# Patient Record
Sex: Female | Born: 1980 | Race: Black or African American | Hispanic: No | Smoking: Former smoker
Health system: Southern US, Community
[De-identification: ages and names within clinical notes are randomized; demographics above are authoritative.]

## PROBLEM LIST (undated history)

## (undated) DIAGNOSIS — G4700 Insomnia, unspecified: Secondary | ICD-10-CM

## (undated) DIAGNOSIS — N809 Endometriosis, unspecified: Secondary | ICD-10-CM

## (undated) DIAGNOSIS — Z6841 Body Mass Index (BMI) 40.0 and over, adult: Secondary | ICD-10-CM

## (undated) DIAGNOSIS — S62339A Displaced fracture of neck of unspecified metacarpal bone, initial encounter for closed fracture: Secondary | ICD-10-CM

## (undated) DIAGNOSIS — I10 Essential (primary) hypertension: Secondary | ICD-10-CM

## (undated) DIAGNOSIS — E7800 Pure hypercholesterolemia, unspecified: Secondary | ICD-10-CM

## (undated) DIAGNOSIS — S82899A Other fracture of unspecified lower leg, initial encounter for closed fracture: Secondary | ICD-10-CM

## (undated) DIAGNOSIS — N80129 Deep endometriosis of ovary, unspecified ovary: Secondary | ICD-10-CM

## (undated) DIAGNOSIS — F419 Anxiety disorder, unspecified: Secondary | ICD-10-CM

## (undated) DIAGNOSIS — F909 Attention-deficit hyperactivity disorder, unspecified type: Secondary | ICD-10-CM

## (undated) DIAGNOSIS — Z23 Encounter for immunization: Secondary | ICD-10-CM

## (undated) DIAGNOSIS — R87619 Unspecified abnormal cytological findings in specimens from cervix uteri: Secondary | ICD-10-CM

## (undated) DIAGNOSIS — F40240 Claustrophobia: Secondary | ICD-10-CM

## (undated) DIAGNOSIS — F32A Depression, unspecified: Secondary | ICD-10-CM

## (undated) DIAGNOSIS — Z87828 Personal history of other (healed) physical injury and trauma: Secondary | ICD-10-CM

## (undated) DIAGNOSIS — L309 Dermatitis, unspecified: Secondary | ICD-10-CM

## (undated) DIAGNOSIS — G43909 Migraine, unspecified, not intractable, without status migrainosus: Secondary | ICD-10-CM

## (undated) DIAGNOSIS — G8929 Other chronic pain: Secondary | ICD-10-CM

## (undated) DIAGNOSIS — R519 Headache, unspecified: Secondary | ICD-10-CM

## (undated) DIAGNOSIS — T7840XA Allergy, unspecified, initial encounter: Secondary | ICD-10-CM

## (undated) DIAGNOSIS — E785 Hyperlipidemia, unspecified: Secondary | ICD-10-CM

## (undated) DIAGNOSIS — R51 Headache: Secondary | ICD-10-CM

## (undated) HISTORY — DX: Body Mass Index (BMI) 40.0 and over, adult: Z684

## (undated) HISTORY — PX: LIFT, ARM (COSMETIC): SHX4701

## (undated) HISTORY — DX: Dermatitis, unspecified: L30.9

## (undated) HISTORY — DX: Morbid (severe) obesity due to excess calories: E66.01

## (undated) HISTORY — DX: Encounter for immunization: Z23

## (undated) HISTORY — DX: Endometriosis, unspecified: N80.9

## (undated) HISTORY — DX: Unspecified abnormal cytological findings in specimens from cervix uteri: R87.619

## (undated) HISTORY — DX: Claustrophobia: F40.240

## (undated) HISTORY — DX: Insomnia, unspecified: G47.00

## (undated) HISTORY — DX: Essential (primary) hypertension: I10

## (undated) HISTORY — DX: Pure hypercholesterolemia, unspecified: E78.00

## (undated) HISTORY — DX: Attention-deficit hyperactivity disorder, unspecified type: F90.9

## (undated) HISTORY — PX: OTHER SURGICAL HISTORY: SHX169

## (undated) HISTORY — DX: Headache, unspecified: R51.9

## (undated) HISTORY — DX: Migraine, unspecified, not intractable, without status migrainosus: G43.909

## (undated) HISTORY — DX: Headache: R51

## (undated) HISTORY — DX: Other chronic pain: G89.29

## (undated) HISTORY — PX: CYST EXCISION: SHX5701

## (undated) HISTORY — DX: Hyperlipidemia, unspecified: E78.5

## (undated) HISTORY — DX: Allergy, unspecified, initial encounter: T78.40XA

---
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
## 1999-01-29 ENCOUNTER — Inpatient Hospital Stay (HOSPITAL_COMMUNITY): Admission: AD | Admit: 1999-01-29 | Discharge: 1999-01-29 | Payer: Self-pay | Admitting: Obstetrics

## 1999-03-01 ENCOUNTER — Emergency Department (HOSPITAL_COMMUNITY): Admission: EM | Admit: 1999-03-01 | Discharge: 1999-03-01 | Payer: Self-pay | Admitting: Emergency Medicine

## 1999-03-29 ENCOUNTER — Inpatient Hospital Stay (HOSPITAL_COMMUNITY): Admission: AD | Admit: 1999-03-29 | Discharge: 1999-03-29 | Payer: Self-pay | Admitting: Obstetrics

## 1999-07-27 ENCOUNTER — Other Ambulatory Visit: Admission: RE | Admit: 1999-07-27 | Discharge: 1999-07-27 | Payer: Self-pay | Admitting: Obstetrics

## 1999-10-21 ENCOUNTER — Inpatient Hospital Stay (HOSPITAL_COMMUNITY): Admission: AD | Admit: 1999-10-21 | Discharge: 1999-10-21 | Payer: Self-pay | Admitting: Obstetrics

## 1999-10-25 ENCOUNTER — Inpatient Hospital Stay (HOSPITAL_COMMUNITY): Admission: AD | Admit: 1999-10-25 | Discharge: 1999-10-25 | Payer: Self-pay | Admitting: Obstetrics

## 1999-11-15 ENCOUNTER — Inpatient Hospital Stay (HOSPITAL_COMMUNITY): Admission: AD | Admit: 1999-11-15 | Discharge: 1999-11-15 | Payer: Self-pay | Admitting: Obstetrics

## 1999-12-03 ENCOUNTER — Inpatient Hospital Stay (HOSPITAL_COMMUNITY): Admission: AD | Admit: 1999-12-03 | Discharge: 1999-12-05 | Payer: Self-pay | Admitting: Obstetrics

## 1999-12-10 ENCOUNTER — Encounter: Admission: RE | Admit: 1999-12-10 | Discharge: 2000-03-09 | Payer: Self-pay | Admitting: Obstetrics

## 2000-02-26 ENCOUNTER — Emergency Department (HOSPITAL_COMMUNITY): Admission: EM | Admit: 2000-02-26 | Discharge: 2000-02-26 | Payer: Self-pay | Admitting: Emergency Medicine

## 2000-08-09 ENCOUNTER — Inpatient Hospital Stay (HOSPITAL_COMMUNITY): Admission: AD | Admit: 2000-08-09 | Discharge: 2000-08-09 | Payer: Self-pay | Admitting: Obstetrics

## 2001-01-29 ENCOUNTER — Other Ambulatory Visit: Admission: RE | Admit: 2001-01-29 | Discharge: 2001-01-29 | Payer: Self-pay | Admitting: Obstetrics

## 2001-08-17 ENCOUNTER — Emergency Department (HOSPITAL_COMMUNITY): Admission: EM | Admit: 2001-08-17 | Discharge: 2001-08-17 | Payer: Self-pay

## 2001-11-14 HISTORY — PX: ABDOMINAL HYSTERECTOMY: SHX81

## 2002-02-14 ENCOUNTER — Inpatient Hospital Stay (HOSPITAL_COMMUNITY): Admission: AD | Admit: 2002-02-14 | Discharge: 2002-02-14 | Payer: Self-pay | Admitting: *Deleted

## 2002-02-15 ENCOUNTER — Encounter: Payer: Self-pay | Admitting: Obstetrics

## 2002-02-15 ENCOUNTER — Ambulatory Visit (HOSPITAL_COMMUNITY): Admission: RE | Admit: 2002-02-15 | Discharge: 2002-02-15 | Payer: Self-pay | Admitting: *Deleted

## 2002-02-25 ENCOUNTER — Emergency Department (HOSPITAL_COMMUNITY): Admission: EM | Admit: 2002-02-25 | Discharge: 2002-02-25 | Payer: Self-pay | Admitting: Emergency Medicine

## 2002-03-08 ENCOUNTER — Inpatient Hospital Stay (HOSPITAL_COMMUNITY): Admission: AD | Admit: 2002-03-08 | Discharge: 2002-03-08 | Payer: Self-pay | Admitting: Obstetrics

## 2002-05-14 ENCOUNTER — Inpatient Hospital Stay (HOSPITAL_COMMUNITY): Admission: AD | Admit: 2002-05-14 | Discharge: 2002-05-14 | Payer: Self-pay | Admitting: Obstetrics

## 2002-07-14 ENCOUNTER — Inpatient Hospital Stay (HOSPITAL_COMMUNITY): Admission: AD | Admit: 2002-07-14 | Discharge: 2002-07-14 | Payer: Self-pay | Admitting: Obstetrics

## 2002-07-18 ENCOUNTER — Inpatient Hospital Stay (HOSPITAL_COMMUNITY): Admission: AD | Admit: 2002-07-18 | Discharge: 2002-07-18 | Payer: Self-pay | Admitting: Obstetrics

## 2002-09-23 ENCOUNTER — Inpatient Hospital Stay (HOSPITAL_COMMUNITY): Admission: AD | Admit: 2002-09-23 | Discharge: 2002-09-27 | Payer: Self-pay | Admitting: Obstetrics

## 2002-09-24 ENCOUNTER — Encounter (INDEPENDENT_AMBULATORY_CARE_PROVIDER_SITE_OTHER): Payer: Self-pay | Admitting: *Deleted

## 2003-01-16 ENCOUNTER — Encounter: Payer: Self-pay | Admitting: Obstetrics

## 2003-01-16 ENCOUNTER — Ambulatory Visit (HOSPITAL_COMMUNITY): Admission: RE | Admit: 2003-01-16 | Discharge: 2003-01-16 | Payer: Self-pay | Admitting: Obstetrics

## 2003-04-17 ENCOUNTER — Emergency Department (HOSPITAL_COMMUNITY): Admission: EM | Admit: 2003-04-17 | Discharge: 2003-04-17 | Payer: Self-pay | Admitting: Emergency Medicine

## 2004-02-09 ENCOUNTER — Emergency Department (HOSPITAL_COMMUNITY): Admission: EM | Admit: 2004-02-09 | Discharge: 2004-02-09 | Payer: Self-pay | Admitting: Emergency Medicine

## 2004-02-11 ENCOUNTER — Inpatient Hospital Stay (HOSPITAL_COMMUNITY): Admission: AD | Admit: 2004-02-11 | Discharge: 2004-02-12 | Payer: Self-pay | Admitting: Obstetrics

## 2004-03-05 ENCOUNTER — Emergency Department (HOSPITAL_COMMUNITY): Admission: AD | Admit: 2004-03-05 | Discharge: 2004-03-05 | Payer: Self-pay | Admitting: Family Medicine

## 2004-04-11 ENCOUNTER — Emergency Department (HOSPITAL_COMMUNITY): Admission: EM | Admit: 2004-04-11 | Discharge: 2004-04-11 | Payer: Self-pay | Admitting: Internal Medicine

## 2004-07-08 ENCOUNTER — Emergency Department (HOSPITAL_COMMUNITY): Admission: EM | Admit: 2004-07-08 | Discharge: 2004-07-08 | Payer: Self-pay | Admitting: Emergency Medicine

## 2004-11-14 DIAGNOSIS — N80129 Deep endometriosis of ovary, unspecified ovary: Secondary | ICD-10-CM

## 2004-11-14 HISTORY — DX: Deep endometriosis of ovary, unspecified ovary: N80.129

## 2004-11-14 HISTORY — PX: HYSTERECTOMY: SHX81

## 2004-11-14 HISTORY — PX: OTHER SURGICAL HISTORY: SHX169

## 2004-11-14 HISTORY — PX: ABDOMINAL SURGERY: SHX537

## 2005-01-18 ENCOUNTER — Emergency Department (HOSPITAL_COMMUNITY): Admission: EM | Admit: 2005-01-18 | Discharge: 2005-01-18 | Payer: Self-pay | Admitting: Family Medicine

## 2005-02-08 ENCOUNTER — Ambulatory Visit (HOSPITAL_COMMUNITY): Admission: RE | Admit: 2005-02-08 | Discharge: 2005-02-08 | Payer: Self-pay | Admitting: Obstetrics

## 2005-02-08 ENCOUNTER — Encounter (INDEPENDENT_AMBULATORY_CARE_PROVIDER_SITE_OTHER): Payer: Self-pay | Admitting: Specialist

## 2005-04-06 ENCOUNTER — Emergency Department (HOSPITAL_COMMUNITY): Admission: EM | Admit: 2005-04-06 | Discharge: 2005-04-06 | Payer: Self-pay | Admitting: Emergency Medicine

## 2005-04-07 ENCOUNTER — Emergency Department (HOSPITAL_COMMUNITY): Admission: EM | Admit: 2005-04-07 | Discharge: 2005-04-07 | Payer: Self-pay | Admitting: Emergency Medicine

## 2005-06-15 ENCOUNTER — Emergency Department (HOSPITAL_COMMUNITY): Admission: EM | Admit: 2005-06-15 | Discharge: 2005-06-15 | Payer: Self-pay | Admitting: Family Medicine

## 2005-07-12 ENCOUNTER — Ambulatory Visit (HOSPITAL_COMMUNITY): Admission: RE | Admit: 2005-07-12 | Discharge: 2005-07-12 | Payer: Self-pay | Admitting: *Deleted

## 2005-07-12 ENCOUNTER — Emergency Department (HOSPITAL_COMMUNITY): Admission: EM | Admit: 2005-07-12 | Discharge: 2005-07-12 | Payer: Self-pay | Admitting: Family Medicine

## 2005-10-26 ENCOUNTER — Inpatient Hospital Stay (HOSPITAL_COMMUNITY): Admission: RE | Admit: 2005-10-26 | Discharge: 2005-10-29 | Payer: Self-pay | Admitting: Obstetrics

## 2005-10-26 ENCOUNTER — Encounter (INDEPENDENT_AMBULATORY_CARE_PROVIDER_SITE_OTHER): Payer: Self-pay | Admitting: Specialist

## 2005-12-02 ENCOUNTER — Emergency Department (HOSPITAL_COMMUNITY): Admission: EM | Admit: 2005-12-02 | Discharge: 2005-12-02 | Payer: Self-pay | Admitting: *Deleted

## 2006-02-08 ENCOUNTER — Emergency Department (HOSPITAL_COMMUNITY): Admission: EM | Admit: 2006-02-08 | Discharge: 2006-02-08 | Payer: Self-pay | Admitting: Family Medicine

## 2006-03-30 ENCOUNTER — Encounter: Admission: RE | Admit: 2006-03-30 | Discharge: 2006-03-30 | Payer: Self-pay | Admitting: Obstetrics and Gynecology

## 2006-06-27 ENCOUNTER — Emergency Department (HOSPITAL_COMMUNITY): Admission: EM | Admit: 2006-06-27 | Discharge: 2006-06-27 | Payer: Self-pay | Admitting: Emergency Medicine

## 2006-11-08 ENCOUNTER — Emergency Department (HOSPITAL_COMMUNITY): Admission: EM | Admit: 2006-11-08 | Discharge: 2006-11-09 | Payer: Self-pay | Admitting: Emergency Medicine

## 2007-02-28 ENCOUNTER — Inpatient Hospital Stay (HOSPITAL_COMMUNITY): Admission: AD | Admit: 2007-02-28 | Discharge: 2007-03-01 | Payer: Self-pay | Admitting: Obstetrics and Gynecology

## 2007-03-25 ENCOUNTER — Emergency Department (HOSPITAL_COMMUNITY): Admission: EM | Admit: 2007-03-25 | Discharge: 2007-03-25 | Payer: Self-pay | Admitting: Emergency Medicine

## 2007-10-17 ENCOUNTER — Emergency Department (HOSPITAL_COMMUNITY): Admission: EM | Admit: 2007-10-17 | Discharge: 2007-10-17 | Payer: Self-pay | Admitting: Emergency Medicine

## 2008-03-02 ENCOUNTER — Emergency Department (HOSPITAL_COMMUNITY): Admission: EM | Admit: 2008-03-02 | Discharge: 2008-03-02 | Payer: Self-pay | Admitting: Family Medicine

## 2008-04-02 ENCOUNTER — Emergency Department (HOSPITAL_COMMUNITY): Admission: EM | Admit: 2008-04-02 | Discharge: 2008-04-02 | Payer: Self-pay | Admitting: Family Medicine

## 2008-12-12 ENCOUNTER — Emergency Department (HOSPITAL_COMMUNITY): Admission: EM | Admit: 2008-12-12 | Discharge: 2008-12-12 | Payer: Self-pay | Admitting: Family Medicine

## 2009-01-15 ENCOUNTER — Encounter: Admission: RE | Admit: 2009-01-15 | Discharge: 2009-01-15 | Payer: Self-pay | Admitting: Gastroenterology

## 2009-01-17 ENCOUNTER — Emergency Department (HOSPITAL_COMMUNITY): Admission: EM | Admit: 2009-01-17 | Discharge: 2009-01-17 | Payer: Self-pay | Admitting: Emergency Medicine

## 2009-02-15 ENCOUNTER — Emergency Department (HOSPITAL_COMMUNITY): Admission: EM | Admit: 2009-02-15 | Discharge: 2009-02-15 | Payer: Self-pay | Admitting: Family Medicine

## 2009-03-01 ENCOUNTER — Emergency Department (HOSPITAL_COMMUNITY): Admission: EM | Admit: 2009-03-01 | Discharge: 2009-03-01 | Payer: Self-pay | Admitting: Emergency Medicine

## 2009-07-24 ENCOUNTER — Emergency Department (HOSPITAL_COMMUNITY): Admission: EM | Admit: 2009-07-24 | Discharge: 2009-07-25 | Payer: Self-pay | Admitting: Emergency Medicine

## 2010-01-29 ENCOUNTER — Emergency Department (HOSPITAL_COMMUNITY): Admission: EM | Admit: 2010-01-29 | Discharge: 2010-01-29 | Payer: Self-pay | Admitting: Family Medicine

## 2010-03-03 ENCOUNTER — Encounter: Admission: RE | Admit: 2010-03-03 | Discharge: 2010-03-03 | Payer: Self-pay | Admitting: Specialist

## 2011-02-24 LAB — POCT I-STAT, CHEM 8
BUN: 13 mg/dL (ref 6–23)
Calcium, Ion: 1.25 mmol/L (ref 1.12–1.32)
Chloride: 104 mEq/L (ref 96–112)
Creatinine, Ser: 0.8 mg/dL (ref 0.4–1.2)
Hemoglobin: 14.6 g/dL (ref 12.0–15.0)
Sodium: 142 mEq/L (ref 135–145)
TCO2: 28 mmol/L (ref 0–100)

## 2011-02-28 LAB — CULTURE, ROUTINE-ABSCESS

## 2011-03-01 ENCOUNTER — Emergency Department (HOSPITAL_COMMUNITY)
Admission: EM | Admit: 2011-03-01 | Discharge: 2011-03-02 | Disposition: A | Discharge status: Home / Self Care | Payer: PRIVATE HEALTH INSURANCE | Attending: Emergency Medicine | Admitting: Emergency Medicine

## 2011-03-01 ENCOUNTER — Inpatient Hospital Stay (INDEPENDENT_AMBULATORY_CARE_PROVIDER_SITE_OTHER)
Admission: RE | Admit: 2011-03-01 | Discharge: 2011-03-01 | Disposition: A | Discharge status: Home / Self Care | Payer: PRIVATE HEALTH INSURANCE | Source: Ambulatory Visit | Attending: Family Medicine | Admitting: Family Medicine

## 2011-03-01 DIAGNOSIS — R10819 Abdominal tenderness, unspecified site: Secondary | ICD-10-CM

## 2011-03-01 DIAGNOSIS — R1031 Right lower quadrant pain: Secondary | ICD-10-CM | POA: Insufficient documentation

## 2011-03-01 DIAGNOSIS — R110 Nausea: Secondary | ICD-10-CM | POA: Insufficient documentation

## 2011-03-01 LAB — BASIC METABOLIC PANEL
Calcium: 9.5 mg/dL (ref 8.4–10.5)
Creatinine, Ser: 0.83 mg/dL (ref 0.4–1.2)
GFR calc non Af Amer: >60 mL/min (ref >60)
Glucose, Bld: 99 mg/dL (ref 70–99)
Sodium: 140 mEq/L (ref 135–145)

## 2011-03-01 LAB — CBC
Hemoglobin: 13.4 g/dL (ref 12.0–15.0)
MCH: 27.1 pg (ref 26.0–34.0)
MCV: 81.8 fL (ref 78.0–100.0)
Platelets: 273 10*3/uL (ref 150–400)
RBC: 4.95 MIL/uL (ref 3.87–5.11)
WBC: 10.6 10*3/uL — ABNORMAL HIGH (ref 4.0–10.5)

## 2011-03-01 LAB — URINALYSIS, ROUTINE W REFLEX MICROSCOPIC
Glucose, UA: NEGATIVE mg/dL
Nitrite: NEGATIVE
Protein, ur: NEGATIVE mg/dL
Specific Gravity, Urine: 1.026 (ref 1.005–1.030)
Urobilinogen, UA: 0.2 mg/dL (ref 0.0–1.0)

## 2011-03-01 LAB — DIFFERENTIAL
Basophils Absolute: 0 10*3/uL (ref 0.0–0.1)
Eosinophils Relative: 2 % (ref 0–5)
Monocytes Absolute: 1.2 10*3/uL — ABNORMAL HIGH (ref 0.1–1.0)

## 2011-03-02 LAB — HEPATIC FUNCTION PANEL
ALT: 26 U/L (ref 0–35)
AST: 19 U/L (ref 0–37)
Alkaline Phosphatase: 93 U/L (ref 39–117)
Bilirubin, Direct: <0.1 mg/dL (ref 0.0–0.3)

## 2011-04-01 NOTE — Discharge Summary (Signed)
   NAME:  Stacie Harper, Stacie Harper                       ACCOUNT NO.:  [id number]   MEDICAL RECORD NO.:  [id number]                   PATIENT TYPE:  INP   LOCATION:  9124                                 FACILITY:  [facility]   PHYSICIAN:  Kathreen Cosier, M.D.           DATE OF BIRTH:  17-Mar-1981   DATE OF ADMISSION:  09/23/2002  DATE OF DISCHARGE:                                 DISCHARGE SUMMARY   HISTORY OF PRESENT ILLNESS:  The patient is a 30 year old gravida 4, para 2-  0-1-2, [facility] October 02, 2002 admitted in labor 5 cm dilated, 100%, vertex, -  3.  Positive GBS which was treated with penicillin.  The patient also  desired sterilization.  The patient did not progress beyond 6 cm and  underwent a primary low transverse cesarean section and tubal ligation.  She  had a female from the OP position weighing 8 pound 1 ounce.  Postoperative her  hemoglobin was 9.3.  She did well and was discharged home on the third  postoperative day ambulatory, on a regular diet.   DISCHARGE DIAGNOSES:  1. Status post primary low transverse cesarean section for failure to     progress.  2. Tubal ligation for multiparity.   DISCHARGE MEDICATIONS:  1. Tylenol No.3 one q.3-4h. p.r.n.  2. Ferrous sulfate 325 q.d.                                               Kathreen Cosier, M.D.    BAM/MEDQ  D:  09/27/2002  T:  09/27/2002  Job:  [id number]

## 2011-04-01 NOTE — H&P (Signed)
   NAME:  Stacie Harper, Stacie Harper                       ACCOUNT NO.:  [id number]   MEDICAL RECORD NO.:  [id number]                   PATIENT TYPE:  INP   LOCATION:  9164                                 [facility]ILITY[facility]   PHYSICIAN:  Kathreen Cosier, M.D.           DATE OF BIRTH:  01/05/1981   DATE OF ADMISSION:  09/23/2002  DATE OF DISCHARGE:                                HISTORY & PHYSICAL   HISTORY:  The patient is a 30 year old gravida 4, para 2-0-1-2, [facility] October 02, 2002.  Positive GBS.  She was admitted in labor, 5 cm, 100%, vertex -3.  AROM was performed.  Fluid clear.  The patient got an epidural and her  contractions decreased in intensity.  She was started on Pitocin and got a  maximum of 3 milliunits of Pitocin, contracting every one to two minutes.  An IUPC was inserted at 12:30 and cervix was 6 cm, 100%, vertex, -3, molded  to -2.  At 1:10 in the morning on November 11 the cervix showed changes.  Had to deliver her by cesarean section because of failure to progress in  labor.  OP position.   PHYSICAL EXAMINATION:  GENERAL:  Well-developed female in labor.  HEENT:  Negative.  LUNGS:  Clear.  HEART:  Regular rhythm.  No murmurs.  No gallops.  ABDOMEN:  Term sized uterus.  Fetal heart 140.  EXTREMITIES:  Negative.                                               Kathreen Cosier, M.D.    BAM/MEDQ  D:  09/24/2002  T:  09/24/2002  Job:  [id number]

## 2011-04-01 NOTE — Op Note (Signed)
Stacie Harper, Stacie Harper      ACCOUNT NO.:  [id number]   MEDICAL RECORD NO.:  [id number]          PATIENT TYPE:  INP   LOCATION:  9315                          [facility]ILITY[facility]  [facility]   PHYSICIAN:  Kathreen Cosier, M.D.DATE OF BIRTH:  05-27-1981   DATE OF PROCEDURE:  10/26/2005  DATE OF DISCHARGE:                                 OPERATIVE REPORT   PREOPERATIVE DIAGNOSES:  1.  Chronic pelvic pain.  2.  Adenomyosis.   SURGEON:  Kathreen Cosier, M.D.   FIRST ASSISTANT:  Charles A. Clearance Coots, M.D.   DESCRIPTION OF PROCEDURE:  The patient had spinal placed, and then in the  supine position, abdomen and perineum were prepped and draped.  Bladder  drained to Foley catheter.  Transverse suprapubic incision made below the  old scar, carried down to the fascia.  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  The uterus was  __________ .  Ovaries were normal.  The right round ligament was grasped  with a Kelly clamp and suture ligated with #1 chromic.  Procedure done in  similar fashion on the other side.  Using the Metzenbaum scissors, bladder  was dissected off the uterus and cervix.  The right utero-ovarian ligament  was clamped, cut with #1 chromic on the right.  Procedure done in similar  fashion on the other side.  Uterine vessels skeletonized bilaterally, doubly  clamped with Heaney clamps, cut and suture ligated with #1 chromic.  Procedure done in similar fashion on the other side.  Cardinal ligaments on  the right were clamped cut and sutured with #1 chromic.  Procedure done in  similar fashion on the other side.  Specimen consisted of the uterus,  removing the cervicovaginal junction with Mayo scissors.  Modified  Richardson sutures placed in the angle of the vagina.  Then the vaginal  vault was run with interlocking suture with #1 chromic.  Vault was left  open.  It was noted there was some bleeding from the right infundibulopelvic  ligament and ovary.  Kelly clamp placed across and infundibulopelvic  ligament cut.  Free tie placed and a #1 chromic used for hemostasis.  Estimated blood loss 350 mL.  Lap and sponge counts correct.  Abdomen closed  with peritoneum with continuous suture of 0 chromic.  Fascia with continuous  suture of 0 Dexon.  Skin closed with subcuticular stitch of 4-0 Monocryl.  Urine output 200 mL.  The patient tolerated the procedure well.  Taken to  the recovery room in good condition.           ______________________________  Kathreen Cosier, M.D.     BAM/MEDQ  D:  10/26/2005  T:  10/26/2005  Job:  [id number]

## 2011-04-01 NOTE — Discharge Summary (Signed)
NAMESHASTINA, RUA      ACCOUNT NO.:  [id number]   MEDICAL RECORD NO.:  [id number]          PATIENT TYPE:  INP   LOCATION:  9315                          [facility]ACILITY[facility]  [facility]   PHYSICIAN:  Kathreen Cosier, M.D.DATE OF BIRTH:  02-18-1981   DATE OF ADMISSION:  10/26/2005  DATE OF DISCHARGE:  10/29/2005                                 DISCHARGE SUMMARY   The patient is a 30 year old gravida 5, para 3-0-2-3 with a long history of  dysmenorrhea and chronic pelvic pain.  She had had C-section in the past,  tubal ligation, and also NovaSure ablation because of dysfunctional  bleeding.  NovaSure ablation did not work.  Patient kept continuing to have  pain, more severe at time of her periods.  She was brought in for TAH.  At  the time, an exploratory laparotomy was ordered.  She had an endometrioma in  her incision and this was removed in totality.  She underwent a TAH and a  right salpingo-oophorectomy.  The right salpingo-oophorectomy was done  because of persistent bleeding.  On admission, her hemoglobin was 13.3,  white count 6.9, platelets 255.  PT/PTT normal.  Sodium 137, potassium 4.2,  chloride 104, glucose 91.  Urinalysis was negative.  She was O positive.  Postoperatively, she did well.  Her hemoglobin was 12.1.  The patient was  discharged on the third postoperative day, ambulatory, on a regular diet, on  Motrin 800 for pain and Tylenol No. 3 for pain, to see me in four weeks.   DISCHARGE DIAGNOSIS:  Status post a total abdominal hysterectomy and a right  salpingo-oophorectomy because of chronic pelvic pain.           ______________________________  Kathreen Cosier, M.D.     BAM/MEDQ  D:  10/29/2005  T:  10/30/2005  Job:  [id number]

## 2011-04-01 NOTE — Op Note (Signed)
NAMEVIDEL, NOBREGA NO.:  [id number]   MEDICAL RECORD NO.:  [id number]          PATIENT TYPE:  AMB   LOCATION:  SDC                           [facility]ILITY:  [facility]   PHYSICIAN:  Kathreen Cosier, M.D.DATE OF BIRTH:  [birth year]   DATE OF PROCEDURE:  02/08/2005  DATE OF DISCHARGE:                                 OPERATIVE REPORT   PREOPERATIVE DIAGNOSIS:  Hypermenorrhea and dysmenorrhea.   PROCEDURE:  D&C, hysteroscopy, Novasure ablation.   DESCRIPTION OF PROCEDURE:  Under general anesthesia with the patient in the  lithotomy position, the perineum and vagina were prepped and draped.  The  bladder was emptied with a straight catheter.  Bimanual examination revealed  the uterus to be normal size with negative adnexa.  The speculum was placed  in the vagina.  The anterior lip of the cervix was grasped with a tenaculum.  The endocervix curetted and a small amount of tissue obtained.  Endometrial  cavity sounded to 9 cm and the cervical length was measured at 4 cm giving a  cavity length of 5 cm.  The cervix was dilated to a #27 Shawnie Pons and the  diagnostic hysteroscope inserted.  The pump was set at 70 mmHg.  Fluid  deficit total was 120 mL.  The cavity was noted to be normal.  Sharp  curettage was then performed with a moderate amount of tissue obtained.  Novasure device was inserted in the cavity.  Cavity width was 4.7 cm.  Cavity integrity was established.  An ablation was performed at 129 watts  for 1 minute and 22 seconds.  The patient tolerated the procedure well and  taken to the recovery room in good condition.      BAM/MEDQ  D:  02/08/2005  T:  02/08/2005  Job:  [id number]

## 2011-04-01 NOTE — Op Note (Signed)
   NAME:  Stacie Harper, Stacie Harper                       ACCOUNT NO.:  [id number]   MEDICAL RECORD NO.:  [id number]                   PATIENT TYPE:  INP   LOCATION:  9164                                 [facility]   PHYSICIAN:  Kathreen Cosier, M.D.           DATE OF BIRTH:  06/26/1981   DATE OF PROCEDURE:  09/24/2002  DATE OF DISCHARGE:                                 OPERATIVE REPORT   PREOPERATIVE DIAGNOSES:  Failure to progress in labor.   SURGEON:  Kathreen Cosier, M.D.   ANESTHESIA:  Epidural.   PROCEDURE:  The patient placed on the operating table in a supine position.  Abdomen prepped and draped.  Bladder emptied with Foley catheter.  Transverse suprapubic incision made, carried down to the rectus fascia.  Fascia cleaned and incised the length of the incision.  Recti muscles  retracted laterally.  Peritoneum incised longitudinally.  Transverse  incision made in the visceroperitoneum above the bladder and the bladder  mobilized inferiorly.  Transverse lower uterine incision made.  The patient  delivered from the OP position of a female Apgar 8 and 9 weighing 8 pounds 1  ounce.  The team was in attendance.  The fluid was clear.  Placenta was  anterior, removed manually and the uterine cavity cleaned with dry laps.  Uterine incision closed in one layer continuous suture of number 1 chromic.  Bladder flap reattached with 2-0 chromic.  Uterus well contracted.  Tubes  and ovaries normal.  Right tube grasped in mid portion with Babcock clamp.  0 plain suture placed in the mesosalpinx below the portion of tube within  the clamp.  This was tied.  Approximately 1 inch of tube transected.  The  procedure done in a similar fashion the other side.  Lap and sponge counts  correct.  Abdomen closed in layers.  Peritoneum continuous suture of 0  chromic.  Fascia continuous suture of 0 Dexon.  Skin closed with  subcuticular stitch of 3-0 plain.  Blood loss 500 cc.                             Kathreen Cosier, M.D.    BAM/MEDQ  D:  09/24/2002  T:  09/24/2002  Job:  [id number]

## 2011-04-30 ENCOUNTER — Emergency Department (HOSPITAL_COMMUNITY): Payer: Managed Care, Other (non HMO)

## 2011-04-30 ENCOUNTER — Emergency Department (HOSPITAL_COMMUNITY)
Admission: EM | Admit: 2011-04-30 | Discharge: 2011-04-30 | Disposition: A | Discharge status: Home / Self Care | Payer: Managed Care, Other (non HMO) | Attending: Emergency Medicine | Admitting: Emergency Medicine

## 2011-04-30 DIAGNOSIS — E669 Obesity, unspecified: Secondary | ICD-10-CM | POA: Insufficient documentation

## 2011-04-30 DIAGNOSIS — X500XXA Overexertion from strenuous movement or load, initial encounter: Secondary | ICD-10-CM | POA: Insufficient documentation

## 2011-04-30 DIAGNOSIS — S82899A Other fracture of unspecified lower leg, initial encounter for closed fracture: Secondary | ICD-10-CM | POA: Insufficient documentation

## 2011-08-22 LAB — I-STAT 8, (EC8 V) (CONVERTED LAB)
BUN: 11
Bicarbonate: 21.5
Chloride: 109
Glucose, Bld: 85
HCT: 50 — ABNORMAL HIGH
Hemoglobin: 17 — ABNORMAL HIGH
Operator id: 285841
Sodium: 136

## 2011-08-22 LAB — DIFFERENTIAL
Basophils Absolute: 0
Basophils Relative: 1
Eosinophils Absolute: 0.1 — ABNORMAL LOW
Monocytes Absolute: 0.5
Monocytes Relative: 6
Neutro Abs: 3.6
Neutrophils Relative %: 48

## 2011-08-22 LAB — CBC
MCHC: 34.3
MCV: 81.9
RBC: 5.18 — ABNORMAL HIGH
RDW: 13.9

## 2011-08-22 LAB — POCT I-STAT CREATININE
Creatinine, Ser: 0.9
Operator id: 285841

## 2011-08-22 LAB — POCT CARDIAC MARKERS: Myoglobin, poc: 80.3

## 2011-09-04 ENCOUNTER — Inpatient Hospital Stay (INDEPENDENT_AMBULATORY_CARE_PROVIDER_SITE_OTHER)
Admission: RE | Admit: 2011-09-04 | Discharge: 2011-09-04 | Disposition: A | Discharge status: Home / Self Care | Payer: Managed Care, Other (non HMO) | Source: Ambulatory Visit | Attending: Family Medicine | Admitting: Family Medicine

## 2011-09-04 DIAGNOSIS — M545 Low back pain, unspecified: Secondary | ICD-10-CM

## 2011-09-04 LAB — POCT URINALYSIS DIP (DEVICE)
Glucose, UA: NEGATIVE mg/dL
Leukocytes, UA: NEGATIVE
Nitrite: NEGATIVE
Protein, ur: 30 mg/dL — AB
Urobilinogen, UA: 0.2 mg/dL (ref 0.0–1.0)

## 2011-10-30 ENCOUNTER — Encounter: Payer: Self-pay | Admitting: Adult Health

## 2011-10-30 ENCOUNTER — Emergency Department (HOSPITAL_COMMUNITY): Payer: Managed Care, Other (non HMO)

## 2011-10-30 ENCOUNTER — Emergency Department (HOSPITAL_COMMUNITY)
Admission: EM | Admit: 2011-10-30 | Discharge: 2011-10-30 | Disposition: A | Discharge status: Home / Self Care | Payer: Managed Care, Other (non HMO) | Attending: Emergency Medicine | Admitting: Emergency Medicine

## 2011-10-30 DIAGNOSIS — R05 Cough: Secondary | ICD-10-CM | POA: Insufficient documentation

## 2011-10-30 DIAGNOSIS — R509 Fever, unspecified: Secondary | ICD-10-CM | POA: Insufficient documentation

## 2011-10-30 DIAGNOSIS — R059 Cough, unspecified: Secondary | ICD-10-CM | POA: Insufficient documentation

## 2011-10-30 DIAGNOSIS — R000 Tachycardia, unspecified: Secondary | ICD-10-CM | POA: Insufficient documentation

## 2011-10-30 DIAGNOSIS — J111 Influenza due to unidentified influenza virus with other respiratory manifestations: Secondary | ICD-10-CM | POA: Insufficient documentation

## 2011-10-30 MED ORDER — GUAIFENESIN-CODEINE 100-10 MG/5ML PO SYRP: 5.0000 mL | ORAL_SOLUTION | Freq: Three times a day (TID) | ORAL | Status: AC | PRN

## 2011-10-30 MED ORDER — IBUPROFEN 200 MG PO TABS
400.0000 mg | ORAL_TABLET | Freq: Once | ORAL | Status: AC
  Administered 2011-10-30: 400 mg via ORAL
  Filled 2011-10-30: qty 2

## 2011-10-30 MED ORDER — ACETAMINOPHEN 500 MG PO TABS
1000.0000 mg | ORAL_TABLET | Freq: Once | ORAL | Status: AC
  Administered 2011-10-30: 975 mg via ORAL

## 2011-10-30 MED ORDER — ACETAMINOPHEN 325 MG PO TABS
650.0000 mg | ORAL_TABLET | Freq: Once | ORAL | Status: DC
  Filled 2011-10-30: qty 3

## 2011-10-30 MED ORDER — PROMETHAZINE HCL 25 MG PO TABS: 12.5000 mg | ORAL_TABLET | Freq: Four times a day (QID) | ORAL | Status: DC | PRN

## 2011-10-30 NOTE — ED Provider Notes (Signed)
History     30yF with fever and general malaise. Onset Saturday. Gradual onset and progressively worse. Feels exhausted. Fever. Productive cough. No v/d. No urinary complaints. No rash. Denies significant pmhx. No visual changes. No neck stiffness.  CSN: [id number] Arrival date & time: 10/30/2011  1:21 PM   First MD Initiated Contact with Patient 10/30/11 1503      Chief Complaint  Patient presents with  . Influenza    (Consider location/radiation/quality/duration/timing/severity/associated sxs/prior treatment) HPI  Past Medical History  Diagnosis Date  . Pneumonia     Past Surgical History  Procedure Date  . Abdominal surgery     History reviewed. No pertinent family history.  History  Substance Use Topics  . Smoking status: Never Smoker   . Smokeless tobacco: Not on file  . Alcohol Use: No    OB History    Grav Para Term Preterm Abortions TAB SAB Ect Mult Living                  Review of Systems   Review of symptoms negative unless otherwise noted in HPI.  Allergies  Review of patient's allergies indicates no known allergies.  Home Medications   Current Outpatient Rx  Name Route Sig Dispense Refill  . DM-GUAIFENESIN ER 30-600 MG PO TB12 Oral Take 1 tablet by mouth every 12 (twelve) hours.      . IBUPROFEN 200 MG PO TABS Oral Take 600 mg by mouth every 6 (six) hours as needed.      . GUAIFENESIN-CODEINE 100-10 MG/5ML PO SYRP Oral Take 5 mLs by mouth 3 (three) times daily as needed for cough. 120 mL 0    BP 148/83  Pulse 132  Temp(Src) 101.5 F (38.6 C) (Oral)  Resp 22  SpO2 96%  Physical Exam  Nursing note and vitals reviewed. Constitutional: She appears well-developed and well-nourished.       mildly uncomfortable appearing but not toxic.  HENT:  Head: Normocephalic and atraumatic.  Right Ear: External ear normal.  Left Ear: External ear normal.  Nose: Nose normal.  Mouth/Throat: Oropharynx is clear and moist. No oropharyngeal exudate.    Eyes: Conjunctivae are normal. Right eye exhibits no discharge. Left eye exhibits no discharge.  Neck: Normal range of motion. Neck supple.  Cardiovascular: Regular rhythm and normal heart sounds.  Exam reveals no gallop and no friction rub.   No murmur heard.      Tachy, no murmur  Pulmonary/Chest: Effort normal and breath sounds normal. No respiratory distress.  Abdominal: Soft. She exhibits no distension. There is no tenderness.  Genitourinary: Vaginal discharge: no cva tenderness.  Musculoskeletal: She exhibits no edema and no tenderness.  Lymphadenopathy:    She has no cervical adenopathy.  Neurological: She is alert.  Skin: Skin is warm and dry.  Psychiatric: She has a normal mood and affect. Her behavior is normal. Thought content normal.    ED Course  Procedures (including critical care time)  Labs Reviewed - No data to display Dg Chest 2 View  10/30/2011  *RADIOLOGY REPORT*  Clinical Data: Productive cough and congestion.  Fever  CHEST - 2 VIEW  Comparison: 10/17/2007  Findings: The heart size and mediastinal contours are within normal limits.  Both lungs are clear.  The visualized skeletal structures are unremarkable.  IMPRESSION: No active cardiopulmonary abnormalities.  Original Report Authenticated By: Rosealee Albee, M.D.     1. Fever   2. Influenza   3. Cough     3:24 PM  Discussed CXR with pt and strict return precautions given. Pt requesting something for nausea too. No vomiting in ED.  MDM  30yf with URI, likely viral. Suspect influenza. Pt nontoxic.         Raeford Razor, MD 11/05/11 0111

## 2011-10-30 NOTE — ED Notes (Signed)
C/o body aches, fever and lightheadednesss since Saturday. Fever 103.0 Sats on RA 94%.  Productive cough with brown sputum.

## 2012-08-27 ENCOUNTER — Emergency Department (INDEPENDENT_AMBULATORY_CARE_PROVIDER_SITE_OTHER)
Admission: EM | Admit: 2012-08-27 | Discharge: 2012-08-27 | Disposition: A | Discharge status: Home / Self Care | Payer: Managed Care, Other (non HMO) | Source: Home / Self Care | Attending: Emergency Medicine | Admitting: Emergency Medicine

## 2012-08-27 ENCOUNTER — Encounter (HOSPITAL_COMMUNITY): Payer: Self-pay

## 2012-08-27 DIAGNOSIS — M6740 Ganglion, unspecified site: Secondary | ICD-10-CM

## 2012-08-27 MED ORDER — MELOXICAM 15 MG PO TABS: 15.0000 mg | ORAL_TABLET | Freq: Every day | ORAL | Status: DC

## 2012-08-27 NOTE — ED Notes (Signed)
Noted painful area on her left hand, no trauma x 3 weeks

## 2012-08-27 NOTE — ED Provider Notes (Signed)
Chief Complaint  Patient presents with  . Hand Pain    History of Present Illness:   The patient is a 31 year old female who presents with right hand pain. This is been going on for about 2 months. She noted a painful bump over the MCP joint of the thumb. This seems to wax and wane in size. It's tender to touch. She has pain on flexion and extension. He denies any numbness or tingling. There's been no injury to the joint.  Review of Systems:  Other than noted above, the patient denies any of the following symptoms: Systemic:  No fevers, chills, sweats, or aches.  No fatigue or tiredness. Musculoskeletal:  No joint pain, arthritis, bursitis, swelling, back pain, or neck pain. Neurological:  No muscular weakness, paresthesias, headache, or trouble with speech or coordination.  No dizziness.  PMFSH:  Past medical history, family history, social history, meds, and allergies were reviewed.  Physical Exam:   Vital signs:  BP 119/78  Pulse 94  Temp 99.3 F (37.4 C) (Oral)  Resp 12  SpO2 97% Gen:  Alert and oriented times 3.  In no distress. Musculoskeletal: There is a tiny, free movable, cystic lesion over the dorsum of the MCP joint of the thumb. This was tender to touch. Otherwise, all joints had a full a ROM with no swelling, bruising or deformity.  No edema, pulses full. Extremities were warm and pink.  Capillary refill was brisk.  Skin:  Clear, warm and dry.  No rash. Neuro:  Alert and oriented times 3.  Muscle strength was normal.  Sensation was intact to light touch.   Course in Urgent Care Center:   She was placed in a thumb spica splint.  Assessment:  The encounter diagnosis was Ganglion cyst.  Plan:   1.  The following meds were prescribed:   New Prescriptions   MELOXICAM (MOBIC) 15 MG TABLET    Take 1 tablet (15 mg total) by mouth daily.   2.  The patient was instructed in symptomatic care, including rest and activity, elevation, application of ice and compression.  Appropriate  handouts were given. 3.  The patient was told to return if becoming worse in any way, if no better in 3 or 4 days, and given some red flag symptoms that would indicate earlier return.   4.  The patient was told to follow up with Dr. Dairl Ponder as soon as possible for definitive surgical correction.   Reuben Likes, MD 08/27/12 [phone]

## 2012-08-27 NOTE — ED Notes (Signed)
Patient was called 08:55 by Blase Mess not in lobby

## 2013-02-08 ENCOUNTER — Ambulatory Visit: Payer: Managed Care, Other (non HMO) | Admitting: Internal Medicine

## 2013-02-08 DIAGNOSIS — Z0289 Encounter for other administrative examinations: Secondary | ICD-10-CM

## 2013-05-22 ENCOUNTER — Encounter (HOSPITAL_COMMUNITY): Payer: Self-pay

## 2013-05-22 ENCOUNTER — Encounter (HOSPITAL_COMMUNITY): Payer: Self-pay | Admitting: Emergency Medicine

## 2013-05-22 ENCOUNTER — Emergency Department (HOSPITAL_COMMUNITY)
Admission: EM | Admit: 2013-05-22 | Discharge: 2013-05-22 | Disposition: A | Discharge status: Nursing Home (Includes ICF) | Payer: Managed Care, Other (non HMO) | Source: Home / Self Care | Attending: Emergency Medicine | Admitting: Emergency Medicine

## 2013-05-22 ENCOUNTER — Emergency Department (HOSPITAL_COMMUNITY)
Admission: EM | Admit: 2013-05-22 | Discharge: 2013-05-22 | Disposition: A | Discharge status: Home / Self Care | Payer: Managed Care, Other (non HMO) | Attending: Emergency Medicine | Admitting: Emergency Medicine

## 2013-05-22 ENCOUNTER — Emergency Department (HOSPITAL_COMMUNITY): Payer: Managed Care, Other (non HMO)

## 2013-05-22 DIAGNOSIS — R222 Localized swelling, mass and lump, trunk: Secondary | ICD-10-CM

## 2013-05-22 DIAGNOSIS — M25419 Effusion, unspecified shoulder: Secondary | ICD-10-CM | POA: Insufficient documentation

## 2013-05-22 DIAGNOSIS — R229 Localized swelling, mass and lump, unspecified: Secondary | ICD-10-CM | POA: Insufficient documentation

## 2013-05-22 DIAGNOSIS — Z8701 Personal history of pneumonia (recurrent): Secondary | ICD-10-CM | POA: Insufficient documentation

## 2013-05-22 MED ORDER — PROMETHAZINE HCL 25 MG PO TABS
25.0000 mg | ORAL_TABLET | Freq: Once | ORAL | Status: AC
  Administered 2013-05-22: 25 mg via ORAL
  Filled 2013-05-22: qty 1

## 2013-05-22 MED ORDER — ONDANSETRON 4 MG PO TBDP: 4.0000 mg | ORAL_TABLET | Freq: Three times a day (TID) | ORAL | Status: DC | PRN

## 2013-05-22 MED ORDER — OXYCODONE-ACETAMINOPHEN 5-325 MG PO TABS
1.0000 | ORAL_TABLET | Freq: Once | ORAL | Status: AC
  Administered 2013-05-22: 1 via ORAL
  Filled 2013-05-22: qty 1

## 2013-05-22 MED ORDER — OXYCODONE-ACETAMINOPHEN 5-325 MG PO TABS: 1.0000 | ORAL_TABLET | ORAL | Status: DC | PRN

## 2013-05-22 MED ORDER — PROMETHAZINE HCL 25 MG PO TABS: 25.0000 mg | ORAL_TABLET | Freq: Four times a day (QID) | ORAL | Status: DC | PRN

## 2013-05-22 MED ORDER — CLINDAMYCIN HCL 150 MG PO CAPS: 300.0000 mg | ORAL_CAPSULE | Freq: Three times a day (TID) | ORAL | Status: DC

## 2013-05-22 MED ORDER — ONDANSETRON 4 MG PO TBDP
4.0000 mg | ORAL_TABLET | Freq: Once | ORAL | Status: DC
  Filled 2013-05-22 (×2): qty 1

## 2013-05-22 NOTE — ED Notes (Signed)
Mass above lt. Anterior  Clavicle area, red and sore to touch.  Send from urgent care for evaluation, Denies any fever or chills

## 2013-05-22 NOTE — ED Notes (Signed)
"  zofran doesn't work for me."

## 2013-05-22 NOTE — ED Provider Notes (Signed)
History  This chart was scribed for non-physician practitioner working with Joya Gaskins, MD by Greggory Stallion, ED scribe. This patient was seen in room TR07C/TR07C and the patient's care was started at 4:20 PM.  CSN: [id number] Arrival date & time 05/22/13  1533   Chief Complaint  Patient presents with  . Mass   The history is provided by the patient. No language interpreter was used.    HPI Comments: Stacie Harper is a 32 y.o. female who presents to the Emergency Department complaining of a mass above her left clavicle x 1 week.  Denies any recent injury or trauma- -sent from UC for further evaluation.  She states when she woke up this morning the mass felt bigger and more firm than it has over the past few days.  Pain exacerbated with movement of left arm-- causes pain to radiate down left arm.  No numbness or paresthesias of UE. Pt denies and fevers, sweats, or chills.  No recent illnesses.  No significant PMH. Taken several OTC pain meds and warm compresses without relief.  Past Medical History  Diagnosis Date  . Pneumonia    Past Surgical History  Procedure Laterality Date  . Abdominal surgery    . Abdominal hysterectomy    . Cesarean section    . Cyst excision Right     thumb   No family history on file. History  Substance Use Topics  . Smoking status: Never Smoker   . Smokeless tobacco: Not on file  . Alcohol Use: No   OB History   Grav Para Term Preterm Abortions TAB SAB Ect Mult Living                 Review of Systems  Musculoskeletal: Positive for joint swelling.  All other systems reviewed and are negative.    Allergies  Review of patient's allergies indicates no known allergies.  Home Medications   Current Outpatient Rx  Name  Route  Sig  Dispense  Refill  . dextromethorphan-guaiFENesin (MUCINEX DM) 30-600 MG per 12 hr tablet   Oral   Take 1 tablet by mouth every 12 (twelve) hours.           . meloxicam (MOBIC) 15 MG tablet    Oral   Take 1 tablet (15 mg total) by mouth daily.   15 tablet   0    BP 151/95  Pulse 94  Temp(Src) 98.2 F (36.8 C) (Oral)  Resp 18  SpO2 96%  Physical Exam  Nursing note and vitals reviewed. Constitutional: She is oriented to person, place, and time. She appears well-developed and well-nourished.  HENT:  Head: Normocephalic and atraumatic.  Mouth/Throat: Oropharynx is clear and moist. No oropharyngeal exudate.  Eyes: Conjunctivae and EOM are normal. Pupils are equal, round, and reactive to light.  Neck: Normal range of motion. Neck supple.  Cardiovascular: Normal rate, regular rhythm and normal heart sounds.   Pulmonary/Chest: Effort normal and breath sounds normal.  Musculoskeletal: Normal range of motion.  Left supraclavicular swelling with TTP, no apparent bony deformity, laceration, or abrasion; full ROM of left shoulder and arm without pain, strong radial pulses, distal sensation intact  Neurological: She is alert and oriented to person, place, and time.  Skin: Skin is warm and dry.  Psychiatric: She has a normal mood and affect.    ED Course  Procedures (including critical care time)  DIAGNOSTIC STUDIES: Oxygen Saturation is 96% on RA, normal by my interpretation.    COORDINATION  OF CARE: 4:27 PM-Discussed treatment plan which includes pain medication and xray with pt at bedside and pt agreed to plan.   Labs Reviewed - No data to display Dg Clavicle Left  05/22/2013   *RADIOLOGY REPORT*  Clinical Data: Pain for 3 days, medial swelling.  LEFT CLAVICLE - 2+ VIEWS  Comparison:  None.  Findings:  There is no evidence of fracture or other focal bone lesions. There appears to be soft tissue swelling above the clavicle of uncertain nature, possible hematoma.  If clinically indicated, CT scanning could provide additional information.  IMPRESSION: No osseous findings.   Original Report Authenticated By: Davonna Belling, M.D.   1. Supraclavicular mass     MDM   X-ray  negative for acute clavicular fracture or dislocation.  Patient evaluated by Dr. Bebe Shaggy-- recommended trial of antibiotics for lymphadenopathy and followup with wellness clinic within one week for recheck.  Rx clindamycin and percocet.  Discussed alternative diagnoses including soft tissue mass, tumor, and cancer which is why close FU is needed-- pt acknowledged understanding and agreed with plan.  Return precautions advised.  I personally performed the services described in this documentation, which was scribed in my presence. The recorded information has been reviewed and is accurate.   Garlon Hatchet, PA-C 05/22/13 2205

## 2013-05-22 NOTE — ED Notes (Signed)
Patient reports soreness left collar bone onset 3 days ago, since then, this has resorted to being painful and palpable firmness in area of pain.  Reports with movement pain radiates across chest and under left arm and in upper arm.

## 2013-05-22 NOTE — ED Provider Notes (Signed)
   History    CSN: [id number] Arrival date & time 05/22/13  1248  First MD Initiated Contact with Patient 05/22/13 1326     Chief Complaint  Patient presents with  . Mass   (Consider location/radiation/quality/duration/timing/severity/associated sxs/prior Treatment) HPI Comments: Patient presents urgent care complaining of significant swelling and pain on the anterior and superior aspect of her left supraclavicular region. She describes it as a been hurting for 3-4 days but until this morning she has noticed a significant palpable firmness in the area. With significant pain. She denies any further symptoms such as fevers or generalized malaise. Patient also denies any recent trauma or injury.   Pain is exacerbated with minimal movement and direct palpation over the affected area. Denies any nearby rashes or localize infections or a sore throat.   The history is provided by the patient.   Past Medical History  Diagnosis Date  . Pneumonia    Past Surgical History  Procedure Laterality Date  . Abdominal surgery    . Abdominal hysterectomy    . Cesarean section    . Cyst excision Right     thumb   No family history on file. History  Substance Use Topics  . Smoking status: Never Smoker   . Smokeless tobacco: Not on file  . Alcohol Use: No   OB History   Grav Para Term Preterm Abortions TAB SAB Ect Mult Living                 Review of Systems  Constitutional: Negative for fever, activity change, appetite change and fatigue.  Skin: Negative for color change, pallor, rash and wound.  Allergic/Immunologic: Negative for immunocompromised state.  Neurological: Negative for headaches.    Allergies  Review of patient's allergies indicates no known allergies.  Home Medications   Current Outpatient Rx  Name  Route  Sig  Dispense  Refill  . dextromethorphan-guaiFENesin (MUCINEX DM) 30-600 MG per 12 hr tablet   Oral   Take 1 tablet by mouth every 12 (twelve) hours.          . meloxicam (MOBIC) 15 MG tablet   Oral   Take 1 tablet (15 mg total) by mouth daily.   15 tablet   0    There were no vitals taken for this visit. Physical Exam  Nursing note and vitals reviewed. Constitutional: No distress.  HENT:  Head: Normocephalic.  Mouth/Throat: No oropharyngeal exudate.  Eyes: Conjunctivae are normal. No scleral icterus.  Neck: No JVD present. No tracheal deviation present. No thyromegaly present.    Pulmonary/Chest: Effort normal.    ED Course  Procedures (including critical care time) Labs Reviewed - No data to display No results found. 1. Fullness of supraclavicular fossa     MDM  Patient presents to urgent care with a significant amount of discomfort and fullness above her left supraclavicular region. Of unknown etiology this could be an isolated lymphadenopathy vs a vascular structure as it has a cord like consistency. We'll transfer patient to the emergency department for further evaluation and characterization of this unusual presentation.  Differential diagnosis includes- branchial system disorder ( cyst etc...)  Patient is afebrile and hemodynamically stable.  Jimmie Molly, MD 05/22/13 [phone]

## 2013-05-22 NOTE — ED Notes (Signed)
Triage interrupted by sending patient to a physician office

## 2013-05-25 NOTE — ED Provider Notes (Signed)
Medical screening examination/treatment/procedure(s) were conducted as a shared visit with non-physician practitioner(s) and myself.  I personally evaluated the patient during the encounter  I felt patient stable for d/c home but needed very close f/u for re-evaluation and informed her of possibility of cancer.  Joya Gaskins, MD 05/25/13 [phone]

## 2013-07-08 ENCOUNTER — Ambulatory Visit (INDEPENDENT_AMBULATORY_CARE_PROVIDER_SITE_OTHER): Payer: Managed Care, Other (non HMO) | Admitting: Internal Medicine

## 2013-07-08 ENCOUNTER — Encounter: Payer: Self-pay | Admitting: Internal Medicine

## 2013-07-08 ENCOUNTER — Ambulatory Visit (INDEPENDENT_AMBULATORY_CARE_PROVIDER_SITE_OTHER): Payer: Managed Care, Other (non HMO)

## 2013-07-08 VITALS — BP 130/82 | HR 85 | Temp 98.4°F | Ht 63.5 in | Wt 291.8 lb

## 2013-07-08 DIAGNOSIS — E669 Obesity, unspecified: Secondary | ICD-10-CM

## 2013-07-08 DIAGNOSIS — K219 Gastro-esophageal reflux disease without esophagitis: Secondary | ICD-10-CM | POA: Insufficient documentation

## 2013-07-08 DIAGNOSIS — R1013 Epigastric pain: Secondary | ICD-10-CM

## 2013-07-08 MED ORDER — FAMOTIDINE 20 MG PO TABS: 20.0000 mg | ORAL_TABLET | Freq: Two times a day (BID) | ORAL | Status: DC

## 2013-07-08 NOTE — Assessment & Plan Note (Signed)
Wt Readings from Last 3 Encounters:  07/08/13 291 lb 12.8 oz (132.36 kg)   The patient is asked to make an attempt to improve diet and exercise patterns to aid in medical management of this problem.

## 2013-07-08 NOTE — Progress Notes (Signed)
Subjective:    Patient ID: Stacie Harper, female    DOB: 08/02/1981, 32 y.o.   MRN: [id number]  Abdominal Pain This is a recurrent problem. The current episode started in the past 7 days. The onset quality is sudden. The most recent episode lasted 6 hours. The problem has been waxing and waning. The pain is located in the generalized abdominal region. The pain is moderate. The quality of the pain is colicky and a sensation of fullness. The abdominal pain radiates to the epigastric region. Associated symptoms include anorexia, diarrhea and nausea. Pertinent negatives include no constipation, dysuria, fever, hematochezia, hematuria, melena, vomiting or weight loss. Exacerbated by: steak traditionally, but not all red meat - recently veggie lasagna. The pain is relieved by bowel movements. Prior diagnostic workup includes ultrasound and GI consult (2010). Her past medical history is significant for abdominal surgery (mesh repair) and GERD. There is no history of gallstones, irritable bowel syndrome, pancreatitis or PUD.    New patient to me, establishing a primary care today, work in for acute abdominal pain issue as above  Also reviewed chronic medical issues  Past Medical History  Diagnosis Date  . Chronic headaches   . Allergy   . Hypertension   . Hyperlipidemia   . Migraines    Family History  Problem Relation Age of Onset  . Heart disease Mother   . Hypertension Mother   . Heart disease Father   . Hyperlipidemia Father   . Hypertension Father   . Diabetes Father   . Diabetes Sister   . Colon cancer Maternal Grandmother   . Hypertension Other   . Hyperlipidemia Other   . Asthma Other   . Ovarian cancer Other    History  Substance Use Topics  . Smoking status: Former Games developer  . Smokeless tobacco: Not on file  . Alcohol Use: No   Review of Systems  Constitutional: Negative for fever and weight loss.  Gastrointestinal: Positive for nausea, abdominal pain, diarrhea  and anorexia. Negative for vomiting, constipation, melena and hematochezia.  Genitourinary: Negative for dysuria and hematuria.  No other specific complaints in a complete review of systems (except as listed in HPI above).     Objective:   Physical Exam BP 130/82  Pulse 85  Temp(Src) 98.4 F (36.9 C) (Oral)  Ht 5' 3.5" (1.613 m)  Wt 291 lb 12.8 oz (132.36 kg)  BMI 50.87 kg/m2  SpO2 98% Wt Readings from Last 3 Encounters:  07/08/13 291 lb 12.8 oz (132.36 kg)   Constitutional: She is obese, but appears well-developed and well-nourished. No distress.  HENT: Head: Normocephalic and atraumatic. Ears: B TMs ok, no erythema or effusion; Nose: Nose normal. Mouth/Throat: Oropharynx is clear and moist. No oropharyngeal exudate.  Eyes: Conjunctivae and EOM are normal. Pupils are equal, round, and reactive to light. No scleral icterus.  Neck: Normal range of motion. Neck supple. No JVD present. No thyromegaly present.  Cardiovascular: Normal rate, regular rhythm and normal heart sounds.  No murmur heard. No BLE edema. Pulmonary/Chest: Effort normal and breath sounds normal. No respiratory distress. She has no wheezes.  Abdominal: obese -Soft. Bowel sounds are normal. She exhibits no distension. There is no tenderness. no masses, no hernia - lower midline scar well healed Musculoskeletal: Normal range of motion, no joint effusions. No gross deformities Neurological: She is alert and oriented to person, place, and time. No cranial nerve deficit. Coordination, balance, strength, speech and gait are normal.  Skin: Skin is warm and dry.  No rash noted. No erythema.  Psychiatric: She has a normal mood and affect. Her behavior is normal. Judgment and thought content normal.   Lab Results  Component Value Date   WBC 10.6* 03/01/2011   HGB 13.4 03/01/2011   HCT 40.5 03/01/2011   PLT 273 03/01/2011   GLUCOSE 99 03/01/2011   ALT 26 03/02/2011   AST 19 03/02/2011   NA 140 03/01/2011   K 4.0 03/01/2011   CL  105 03/01/2011   CREATININE 0.83 03/01/2011   BUN 12 03/01/2011   CO2 29 03/01/2011        Assessment & Plan:    abdominal pain: Epigastric and BLQ associated with diarrhea episodic cramping, food induced - occurs every 2-4 weeks Currently asymptomatic - last episode last week reviewed  Check labs Check CT a/p given hx abd surgery Start H2B consider need for GI eval - endo/colo if imaging and labs unremarkable   Also See problem list. Medications and labs reviewed today.

## 2013-07-08 NOTE — Patient Instructions (Signed)
It was good to see you today. We have reviewed your prior records including labs and tests today Test(s) ordered today. Your results will be released to MyChart (or called to you) after review, usually within 72hours after test completion. If any changes need to be made, you will be notified at that same time. we'll make referral for CT scan abdomen to evaluate pain. Our office will contact you regarding appointment(s) once made. Medications reviewed and updated, start generic Pepcid twice daily as needed for stomach and indigestion symptoms -no other changes recommended at this time. Please schedule followup in 3-4 months to review symptoms if unimproved, call sooner if worse

## 2013-07-08 NOTE — Assessment & Plan Note (Signed)
Hx same - on PPI in 2010 Resume H2B tx now, other tx to depend on results of imaging and labs topday

## 2013-07-09 LAB — CBC WITH DIFFERENTIAL/PLATELET
Basophils Relative: 0.4 % (ref 0.0–3.0)
Eosinophils Relative: 1.8 % (ref 0.0–5.0)
Hemoglobin: 14.7 g/dL (ref 12.0–15.0)
Lymphocytes Relative: 53 % — ABNORMAL HIGH (ref 12.0–46.0)
MCHC: 33 g/dL (ref 30.0–36.0)
MCV: 83.3 fl (ref 78.0–100.0)
Monocytes Absolute: 0.5 10*3/uL (ref 0.1–1.0)
Neutro Abs: 2.6 10*3/uL (ref 1.4–7.7)
Neutrophils Relative %: 37 % — ABNORMAL LOW (ref 43.0–77.0)
RBC: 5.35 Mil/uL — ABNORMAL HIGH (ref 3.87–5.11)
WBC: 6.9 10*3/uL (ref 4.5–10.5)

## 2013-07-09 LAB — LIPID PANEL
Cholesterol: 294 mg/dL — ABNORMAL HIGH (ref 0–200)
HDL: 67.4 mg/dL (ref >39.00)
Total CHOL/HDL Ratio: 4
Triglycerides: 79 mg/dL (ref 0.0–149.0)
VLDL: 15.8 mg/dL (ref 0.0–40.0)

## 2013-07-09 LAB — LIPASE: Lipase: 54 U/L (ref 11.0–59.0)

## 2013-07-09 LAB — HEPATIC FUNCTION PANEL
Albumin: 4.2 g/dL (ref 3.5–5.2)
Alkaline Phosphatase: 83 U/L (ref 39–117)
Total Protein: 8.6 g/dL — ABNORMAL HIGH (ref 6.0–8.3)

## 2013-07-09 LAB — BASIC METABOLIC PANEL
CO2: 28 mEq/L (ref 19–32)
Chloride: 101 mEq/L (ref 96–112)
Creatinine, Ser: 0.9 mg/dL (ref 0.4–1.2)
Sodium: 138 mEq/L (ref 135–145)

## 2013-07-09 LAB — URINALYSIS, ROUTINE W REFLEX MICROSCOPIC
Bilirubin Urine: NEGATIVE
Ketones, ur: NEGATIVE
Leukocytes, UA: NEGATIVE
Specific Gravity, Urine: 1.025 (ref 1.000–1.030)
Urobilinogen, UA: 0.2 (ref 0.0–1.0)

## 2013-07-09 LAB — TSH: TSH: 1.1 u[IU]/mL (ref 0.35–5.50)

## 2013-07-10 ENCOUNTER — Ambulatory Visit (INDEPENDENT_AMBULATORY_CARE_PROVIDER_SITE_OTHER)
Admission: RE | Admit: 2013-07-10 | Discharge: 2013-07-10 | Disposition: A | Discharge status: Home / Self Care | Payer: Managed Care, Other (non HMO) | Source: Ambulatory Visit | Attending: Internal Medicine | Admitting: Internal Medicine

## 2013-07-10 ENCOUNTER — Other Ambulatory Visit: Payer: Self-pay | Admitting: Internal Medicine

## 2013-07-10 ENCOUNTER — Encounter: Payer: Self-pay | Admitting: Internal Medicine

## 2013-07-10 DIAGNOSIS — R1013 Epigastric pain: Secondary | ICD-10-CM

## 2013-07-10 DIAGNOSIS — K219 Gastro-esophageal reflux disease without esophagitis: Secondary | ICD-10-CM

## 2013-07-10 DIAGNOSIS — E785 Hyperlipidemia, unspecified: Secondary | ICD-10-CM | POA: Insufficient documentation

## 2013-07-10 DIAGNOSIS — E669 Obesity, unspecified: Secondary | ICD-10-CM

## 2013-07-10 MED ORDER — IOHEXOL 300 MG/ML  SOLN: 100.0000 mL | Freq: Once | INTRAMUSCULAR | Status: AC | PRN

## 2013-09-19 ENCOUNTER — Other Ambulatory Visit: Payer: Self-pay

## 2013-10-01 ENCOUNTER — Ambulatory Visit: Payer: Managed Care, Other (non HMO) | Admitting: Internal Medicine

## 2013-10-04 ENCOUNTER — Encounter: Payer: Self-pay | Admitting: Internal Medicine

## 2013-10-04 ENCOUNTER — Ambulatory Visit (INDEPENDENT_AMBULATORY_CARE_PROVIDER_SITE_OTHER): Payer: Managed Care, Other (non HMO) | Admitting: Internal Medicine

## 2013-10-04 VITALS — BP 128/82 | HR 100 | Temp 98.8°F | Ht 63.5 in | Wt 304.0 lb

## 2013-10-04 DIAGNOSIS — R110 Nausea: Secondary | ICD-10-CM

## 2013-10-04 DIAGNOSIS — Z0000 Encounter for general adult medical examination without abnormal findings: Secondary | ICD-10-CM

## 2013-10-04 DIAGNOSIS — Z23 Encounter for immunization: Secondary | ICD-10-CM

## 2013-10-04 DIAGNOSIS — E669 Obesity, unspecified: Secondary | ICD-10-CM

## 2013-10-04 DIAGNOSIS — R1013 Epigastric pain: Secondary | ICD-10-CM

## 2013-10-04 MED ORDER — ACETAMINOPHEN 500 MG PO TABS: 1000.0000 mg | ORAL_TABLET | Freq: Two times a day (BID) | ORAL | Status: DC

## 2013-10-04 MED ORDER — PROMETHAZINE HCL 25 MG PO TABS: 25.0000 mg | ORAL_TABLET | Freq: Four times a day (QID) | ORAL | Status: DC | PRN

## 2013-10-04 NOTE — Assessment & Plan Note (Signed)
Hx same - on PPI in 2010 Resumed H2B tx 06/2013 - improved pain but continued nausea, unrelated to time or type of meals Refer to GI for other eval and tx as needed

## 2013-10-04 NOTE — Progress Notes (Signed)
Subjective:    Patient ID: Stacie Harper, female    DOB: [birth year], 32 y.o.   MRN: [id number]  HPI  patient is here today for annual physical. Patient feels well and has no complaints.  Also here for follow up on chronic medical issues: continued nausea, weight gain and insomnia  Past Medical History  Diagnosis Date  . Chronic headaches   . Allergy   . Hypertension   . Hyperlipidemia   . Migraines    Family History  Problem Relation Age of Onset  . Heart disease Mother   . Hypertension Mother   . Heart disease Father   . Hyperlipidemia Father   . Hypertension Father   . Diabetes Father   . Diabetes Sister   . Colon cancer Maternal Grandmother   . Hypertension Other   . Hyperlipidemia Other   . Asthma Other   . Ovarian cancer Other   . Dermatomyositis Mother    History  Substance Use Topics  . Smoking status: Former Games developer  . Smokeless tobacco: Not on file  . Alcohol Use: No    Review of Systems  Constitutional: Negative for fatigue and unexpected weight change.  Respiratory: Negative for cough, shortness of breath and wheezing.   Cardiovascular: Negative for chest pain, palpitations and leg swelling.  Gastrointestinal: Negative for nausea, abdominal pain and diarrhea.  Neurological: Negative for dizziness, weakness, light-headedness and headaches.  Psychiatric/Behavioral: Negative for dysphoric mood. The patient is not nervous/anxious.   All other systems reviewed and are negative.       Objective:   Physical Exam BP 148/88  Pulse 100  Temp(Src) 98.8 F (37.1 C) (Oral)  Ht 5' 3.5" (1.613 m)  Wt 304 lb (137.893 kg)  BMI 53.00 kg/m2  SpO2 95% Wt Readings from Last 3 Encounters:  10/04/13 304 lb (137.893 kg)  07/08/13 291 lb 12.8 oz (132.36 kg)   Constitutional: She is obese, but appears well-developed and well-nourished. No distress.  HENT: Head: Normocephalic and atraumatic. Ears: B TMs ok, no erythema or effusion; Nose: Nose normal.  Mouth/Throat: Oropharynx is clear and moist. No oropharyngeal exudate.  Eyes: Conjunctivae and EOM are normal. Pupils are equal, round, and reactive to light. No scleral icterus.  Neck: Normal range of motion. Neck supple. No JVD present. No thyromegaly present.  Cardiovascular: Normal rate, regular rhythm and normal heart sounds.  No murmur heard. No BLE edema. Pulmonary/Chest: Effort normal and breath sounds normal. No respiratory distress. She has no wheezes.  Abdominal: Soft. Bowel sounds are normal. She exhibits no distension. There is no tenderness. no masses Musculoskeletal: Normal range of motion, no joint effusions. No gross deformities Neurological: She is alert and oriented to person, place, and time. No cranial nerve deficit. Coordination, balance, strength, speech and gait are normal.  Skin: Skin is warm and dry. No rash noted. No erythema.  Psychiatric: She has a normal mood and affect. Her behavior is normal. Judgment and thought content normal.   Lab Results  Component Value Date   WBC 6.9 07/08/2013   HGB 14.7 07/08/2013   HCT 44.6 07/08/2013   PLT 288.0 07/08/2013   GLUCOSE 92 07/08/2013   CHOL 294* 07/08/2013   TRIG 79.0 07/08/2013   HDL 67.40 07/08/2013   LDLDIRECT 214.3 07/08/2013   ALT 22 07/08/2013   AST 19 07/08/2013   NA 138 07/08/2013   K 3.7 07/08/2013   CL 101 07/08/2013   CREATININE 0.9 07/08/2013   BUN 9 07/08/2013   CO2 28 07/08/2013  TSH 1.10 07/08/2013        Assessment & Plan:   CPX/v70.0 - Patient has been counseled on age-appropriate routine health concerns for screening and prevention. These are reviewed and up-to-date. Immunizations are up-to-date or declined. Labs reviewed.  Weight gain/morbid obesity - reviewed labs as above - refer to nutrition counseling - if successful counseling completed, will consider long term rx for assistance with weight loss  Nausea - recent labs ok, exam benign - ?gastroparesis or other issue - refer to GI for eval as  requested and use promethazine (and OTC dramamine) prn  Insomnia - no overlap depression on review - ?OSA, but declines sleep eval at this time - advised to work on weight loss and ok to try OTC sleep aides as desired

## 2013-10-04 NOTE — Patient Instructions (Addendum)
It was good to see you today.  We have reviewed your prior records including labs and tests today  Health Maintenance reviewed - all recommended immunizations and age-appropriate screenings are up-to-date.  Your annual flu shot was given and/or updated today.  Medications reviewed and updated: take promethazine as needed for nausea and 2 tylenol 2xday for back and knee pain - no other changes recommended at this time.  we'll make referral to gastroenterology for evaluation of nausea . Our office will contact you regarding appointment(s) once made.  Also refer to nutrition for counseling as discussed -mild to call regarding this appointment. After successful completion of this therapy, we'll consider medications to assist with weight loss goals if needed  Please schedule followup in 3-4 months for weight check, call sooner if problems.  Health Maintenance, Female A healthy lifestyle and preventative care can promote health and wellness.  Maintain regular health, dental, and eye exams.  Eat a healthy diet. Foods like vegetables, fruits, whole grains, low-fat dairy products, and lean protein foods contain the nutrients you need without too many calories. Decrease your intake of foods high in solid fats, added sugars, and salt. Get information about a proper diet from your caregiver, if necessary.  Regular physical exercise is one of the most important things you can do for your health. Most adults should get at least 150 minutes of moderate-intensity exercise (any activity that increases your heart rate and causes you to sweat) each week. In addition, most adults need muscle-strengthening exercises on 2 or more days a week.   Maintain a healthy weight. The body mass index (BMI) is a screening tool to identify possible weight problems. It provides an estimate of body fat based on height and weight. Your caregiver can help determine your BMI, and can help you achieve or maintain a healthy  weight. For adults 20 years and older:  A BMI below 18.5 is considered underweight.  A BMI of 18.5 to 24.9 is normal.  A BMI of 25 to 29.9 is considered overweight.  A BMI of 30 and above is considered obese.  Maintain normal blood lipids and cholesterol by exercising and minimizing your intake of saturated fat. Eat a balanced diet with plenty of fruits and vegetables. Blood tests for lipids and cholesterol should begin at age 1 and be repeated every 5 years. If your lipid or cholesterol levels are high, you are over 50, or you are a high risk for heart disease, you may need your cholesterol levels checked more frequently.Ongoing high lipid and cholesterol levels should be treated with medicines if diet and exercise are not effective.  If you smoke, find out from your caregiver how to quit. If you do not use tobacco, do not start.  Lung cancer screening is recommended for adults aged 31 80 years who are at high risk for developing lung cancer because of a history of smoking. Yearly low-dose computed tomography (CT) is recommended for people who have at least a 30-pack-year history of smoking and are a current smoker or have quit within the past 15 years. A pack year of smoking is smoking an average of 1 pack of cigarettes a day for 1 year (for example: 1 pack a day for 30 years or 2 packs a day for 15 years). Yearly screening should continue until the smoker has stopped smoking for at least 15 years. Yearly screening should also be stopped for people who develop a health problem that would prevent them from having lung cancer  treatment.  If you are pregnant, do not drink alcohol. If you are breastfeeding, be very cautious about drinking alcohol. If you are not pregnant and choose to drink alcohol, do not exceed 1 drink per day. One drink is considered to be 12 ounces (355 mL) of beer, 5 ounces (148 mL) of wine, or 1.5 ounces (44 mL) of liquor.  Avoid use of street drugs. Do not share needles with  anyone. Ask for help if you need support or instructions about stopping the use of drugs.  High blood pressure causes heart disease and increases the risk of stroke. Blood pressure should be checked at least every 1 to 2 years. Ongoing high blood pressure should be treated with medicines, if weight loss and exercise are not effective.  If you are 21 to 33 years old, ask your caregiver if you should take aspirin to prevent strokes.  Diabetes screening involves taking a blood sample to check your fasting blood sugar level. This should be done once every 3 years, after age 43, if you are within normal weight and without risk factors for diabetes. Testing should be considered at a younger age or be carried out more frequently if you are overweight and have at least 1 risk factor for diabetes.  Breast cancer screening is essential preventative care for women. You should practice "breast self-awareness." This means understanding the normal appearance and feel of your breasts and may include breast self-examination. Any changes detected, no matter how small, should be reported to a caregiver. Women in their 78s and 30s should have a clinical breast exam (CBE) by a caregiver as part of a regular health exam every 1 to 3 years. After age 72, women should have a CBE every year. Starting at age 50, women should consider having a mammogram (breast X-ray) every year. Women who have a family history of breast cancer should talk to their caregiver about genetic screening. Women at a high risk of breast cancer should talk to their caregiver about having an MRI and a mammogram every year.  Breast cancer gene (BRCA)-related cancer risk assessment is recommended for women who have family members with BRCA-related cancers. BRCA-related cancers include breast, ovarian, tubal, and peritoneal cancers. Having family members with these cancers may be associated with an increased risk for harmful changes (mutations) in the breast  cancer genes BRCA1 and BRCA2. Results of the assessment will determine the need for genetic counseling and BRCA1 and BRCA2 testing.  The Pap test is a screening test for cervical cancer. Women should have a Pap test starting at age 41. Between ages 82 and 70, Pap tests should be repeated every 2 years. Beginning at age 67, you should have a Pap test every 3 years as long as the past 3 Pap tests have been normal. If you had a hysterectomy for a problem that was not cancer or a condition that could lead to cancer, then you no longer need Pap tests. If you are between ages 76 and 30, and you have had normal Pap tests going back 10 years, you no longer need Pap tests. If you have had past treatment for cervical cancer or a condition that could lead to cancer, you need Pap tests and screening for cancer for at least 20 years after your treatment. If Pap tests have been discontinued, risk factors (such as a new sexual partner) need to be reassessed to determine if screening should be resumed. Some women have medical problems that increase the chance of  getting cervical cancer. In these cases, your caregiver may recommend more frequent screening and Pap tests.  The human papillomavirus (HPV) test is an additional test that may be used for cervical cancer screening. The HPV test looks for the virus that can cause the cell changes on the cervix. The cells collected during the Pap test can be tested for HPV. The HPV test could be used to screen women aged 81 years and older, and should be used in women of any age who have unclear Pap test results. After the age of 75, women should have HPV testing at the same frequency as a Pap test.  Colorectal cancer can be detected and often prevented. Most routine colorectal cancer screening begins at the age of 20 and continues through age 42. However, your caregiver may recommend screening at an earlier age if you have risk factors for colon cancer. On a yearly basis, your  caregiver may provide home test kits to check for hidden blood in the stool. Use of a small camera at the end of a tube, to directly examine the colon (sigmoidoscopy or colonoscopy), can detect the earliest forms of colorectal cancer. Talk to your caregiver about this at age 51, when routine screening begins. Direct examination of the colon should be repeated every 5 to 10 years through age 91, unless early forms of pre-cancerous polyps or small growths are found.  Hepatitis C blood testing is recommended for all people born from 1 through 1966 and any individual with known risks for hepatitis C.  Practice safe sex. Use condoms and avoid high-risk sexual practices to reduce the spread of sexually transmitted infections (STIs). Sexually active women aged 72 and younger should be checked for Chlamydia, which is a common sexually transmitted infection. Older women with new or multiple partners should also be tested for Chlamydia. Testing for other STIs is recommended if you are sexually active and at increased risk.  Osteoporosis is a disease in which the bones lose minerals and strength with aging. This can result in serious bone fractures. The risk of osteoporosis can be identified using a bone density scan. Women ages 78 and over and women at risk for fractures or osteoporosis should discuss screening with their caregivers. Ask your caregiver whether you should be taking a calcium supplement or vitamin D to reduce the rate of osteoporosis.  Menopause can be associated with physical symptoms and risks. Hormone replacement therapy is available to decrease symptoms and risks. You should talk to your caregiver about whether hormone replacement therapy is right for you.  Use sunscreen. Apply sunscreen liberally and repeatedly throughout the day. You should seek shade when your shadow is shorter than you. Protect yourself by wearing long sleeves, pants, a wide-brimmed hat, and sunglasses year round, whenever  you are outdoors.  Notify your caregiver of new moles or changes in moles, especially if there is a change in shape or color. Also notify your caregiver if a mole is larger than the size of a pencil eraser.  Stay current with your immunizations. Document Released: 05/16/2011 Document Revised: 02/25/2013 Document Reviewed: 05/16/2011 [facility] Patient Information 2014 [place], [place].

## 2013-10-04 NOTE — Progress Notes (Signed)
Pre-visit discussion using our clinic review tool. No additional management support is needed unless otherwise documented below in the visit note.  

## 2013-10-04 NOTE — Assessment & Plan Note (Signed)
Wt Readings from Last 3 Encounters:  10/04/13 304 lb (137.893 kg)  07/08/13 291 lb 12.8 oz (132.36 kg)  weight trends reviewed - Refer to counseling and consider rx med if completes counseling therapy The patient is asked to make an attempt to improve diet and exercise patterns to aid in medical management of this problem.

## 2013-11-21 ENCOUNTER — Encounter: Payer: Self-pay | Admitting: Internal Medicine

## 2014-01-08 ENCOUNTER — Ambulatory Visit: Payer: Managed Care, Other (non HMO) | Admitting: Dietician

## 2014-02-08 ENCOUNTER — Emergency Department (HOSPITAL_COMMUNITY)
Admission: EM | Admit: 2014-02-08 | Discharge: 2014-02-08 | Disposition: A | Discharge status: Home / Self Care | Payer: BC Managed Care – PPO | Attending: Emergency Medicine | Admitting: Emergency Medicine

## 2014-02-08 ENCOUNTER — Emergency Department (HOSPITAL_COMMUNITY): Payer: BC Managed Care – PPO

## 2014-02-08 ENCOUNTER — Encounter (HOSPITAL_COMMUNITY): Payer: Self-pay | Admitting: Emergency Medicine

## 2014-02-08 DIAGNOSIS — I10 Essential (primary) hypertension: Secondary | ICD-10-CM | POA: Insufficient documentation

## 2014-02-08 DIAGNOSIS — Z87891 Personal history of nicotine dependence: Secondary | ICD-10-CM | POA: Insufficient documentation

## 2014-02-08 DIAGNOSIS — Z8639 Personal history of other endocrine, nutritional and metabolic disease: Secondary | ICD-10-CM | POA: Insufficient documentation

## 2014-02-08 DIAGNOSIS — Y939 Activity, unspecified: Secondary | ICD-10-CM | POA: Insufficient documentation

## 2014-02-08 DIAGNOSIS — S62319A Displaced fracture of base of unspecified metacarpal bone, initial encounter for closed fracture: Secondary | ICD-10-CM | POA: Insufficient documentation

## 2014-02-08 DIAGNOSIS — G8929 Other chronic pain: Secondary | ICD-10-CM | POA: Insufficient documentation

## 2014-02-08 DIAGNOSIS — IMO0002 Reserved for concepts with insufficient information to code with codable children: Secondary | ICD-10-CM | POA: Insufficient documentation

## 2014-02-08 DIAGNOSIS — S62339A Displaced fracture of neck of unspecified metacarpal bone, initial encounter for closed fracture: Secondary | ICD-10-CM

## 2014-02-08 DIAGNOSIS — Z862 Personal history of diseases of the blood and blood-forming organs and certain disorders involving the immune mechanism: Secondary | ICD-10-CM | POA: Insufficient documentation

## 2014-02-08 DIAGNOSIS — Y929 Unspecified place or not applicable: Secondary | ICD-10-CM | POA: Insufficient documentation

## 2014-02-08 MED ORDER — HYDROCODONE-ACETAMINOPHEN 5-325 MG PO TABS
2.0000 | ORAL_TABLET | Freq: Once | ORAL | Status: AC
  Administered 2014-02-08: 2 via ORAL
  Filled 2014-02-08: qty 2

## 2014-02-08 MED ORDER — HYDROCODONE-ACETAMINOPHEN 5-325 MG PO TABS: 1.0000 | ORAL_TABLET | ORAL | Status: DC | PRN

## 2014-02-08 NOTE — ED Notes (Signed)
Pt arrived to the ED with a complaint hand pain.  Pt hit a wall with her right fist.  Pt is feeling pain in her arm as well

## 2014-02-08 NOTE — ED Provider Notes (Signed)
CSN: [id number]     Arrival date & time 02/08/14  1945 History  This chart was scribed for non-physician practitioner Ivonne AndrewPeter Zen Felling, PA working with Celene KrasJon R Knapp, MD by Elveria Risingimelie Horne, ED Scribe. This patient was seen in room WTR7/WTR7 and the patient's care was started at 9:26 PM.   Chief Complaint  Patient presents with  . Hand Pain      HPI HPI Comments: Stacie Harper is a 33 y.o. female who presents to the Emergency Department complaining of right hand and arm pain, onset 3.5 hours ago. Patient reports punching a wall with right fist. Pain in hand extends up the right arm. Pain is associated with swelling to the hand and reduced range of motion. Pain is worse with any movements or palpation. Patient denies medical issues. No allergies to medication. She did not use any medication or treatment for symptoms. No other aggravating or alleviating factors. No associated symptoms. No weakness or numbness.   Past Medical History  Diagnosis Date  . Chronic headaches   . Allergy   . Hypertension   . Hyperlipidemia   . Migraines    Past Surgical History  Procedure Laterality Date  . Abdominal surgery  2006    mesh repair  . Abdominal hysterectomy  2003  . Cesarean section    . Cyst excision Right     thumb   Family History  Problem Relation Age of Onset  . Heart disease Mother   . Hypertension Mother   . Heart disease Father   . Hyperlipidemia Father   . Hypertension Father   . Diabetes Father   . Diabetes Sister   . Colon cancer Maternal Grandmother   . Hypertension Other   . Hyperlipidemia Other   . Asthma Other   . Ovarian cancer Other   . Dermatomyositis Mother    History  Substance Use Topics  . Smoking status: Former Games developermoker  . Smokeless tobacco: Not on file  . Alcohol Use: No   OB History   Grav Para Term Preterm Abortions TAB SAB Ect Mult Living                 Review of Systems  Musculoskeletal: Positive for arthralgias.  Neurological: Negative for  weakness and numbness.  All other systems reviewed and are negative.      Allergies  Morphine and related and Zofran  Home Medications   Current Outpatient Rx  Name  Route  Sig  Dispense  Refill  . HYDROcodone-acetaminophen (NORCO/VICODIN) 5-325 MG per tablet   Oral   Take 1-2 tablets by mouth every 4 (four) hours as needed for moderate pain.   6 tablet   0    Triage Vitals: BP 154/100  Pulse 97  Temp(Src) 98.9 F (37.2 C) (Oral)  Resp 20  Wt 292 lb (132.45 kg)  SpO2 94% Physical Exam  Nursing note and vitals reviewed. Constitutional: She is oriented to person, place, and time. She appears well-developed and well-nourished. No distress.  HENT:  Head: Normocephalic and atraumatic.  Eyes: EOM are normal.  Neck: Neck supple. No tracheal deviation present.  Cardiovascular: Normal rate, regular rhythm and normal heart sounds.  Exam reveals no gallop and no friction rub.   No murmur heard. Pulmonary/Chest: Effort normal and breath sounds normal. No respiratory distress. She has no wheezes. She has no rales.  Musculoskeletal: Normal range of motion.  Moderate swelling around the dorsal right hand especially the fourth and fifth metacarpal area. Reduced range of  motion of the finger secondary to pain and swelling. Significant tenderness to palpation over the area. Patient has normal sensations to light touch to all fingertips. Normal capillary refill less than 2 seconds. Her range of motion at the wrist with mild tenderness. No gross deformities. Normal elbow exam.  Neurological: She is alert and oriented to person, place, and time.  Skin: Skin is warm and dry.  Psychiatric: She has a normal mood and affect. Her behavior is normal.    ED Course  Procedures  DIAGNOSTIC STUDIES: Oxygen Saturation is 94% on room air, low by my interpretation.    COORDINATION OF CARE: 9:29 PM- patient seen and evaluated. Patient appears well in moderate pain and discomfort. Swelling and pain  of right hand consistent with boxers fracture. X-rays pending. Pt advised of plan for treatment and pt agrees.   X-rays reviewed. No significant displaced fracture of the fifth metacarpal consistent with boxers fracture. A ulnar gutter splint ordered. Patient given orthopedic hand followup. She agrees with plan.  Imaging Review Dg Hand Complete Right  02/08/2014   CLINICAL DATA:  Punched a wall.  Right hand pain.  EXAM: RIGHT HAND - COMPLETE 3+ VIEW  COMPARISON:  None.  FINDINGS: There is a fracture of the distal fifth metacarpal. Fracture extends just below the metacarpal head. There is no significant displacement. There is approximate 20 degrees anterior angulation.  No other fractures.  The joints are normally space and aligned.  There soft tissue swelling overlying the fracture.  IMPRESSION: Nondisplaced, anteriorly angulated, fracture of the distal fifth right metacarpal.   Electronically Signed   By: Amie Portland M.D.   On: 02/08/2014 20:13     MDM   Final diagnoses:  Boxer's fracture    I personally performed the services described in this documentation, which was scribed in my presence. The recorded information has been reviewed and is accurate.    Angus Seller, PA-C 02/08/14 2356

## 2014-02-08 NOTE — Discharge Instructions (Signed)
Your x-rays shows a fracture through your metacarpal bone in your hands. This is often called a boxer's fracture. Your hand was placed in a splint to help rest your injury. Please followup with an orthopedic hand specialist for continued evaluation and treatment. Take ibuprofen or Aleve to help with pain and inflammation.    Boxer's Fracture You have a break (fracture) of the fifth metacarpal bone. This is commonly called a boxer's fracture. This is the bone in the hand where the little finger attaches. The fracture is in the end of that bone, closest to the little finger. It is usually caused when you hit an object with a clenched fist. Often, the knuckle is pushed down by the impact. Sometimes, the fracture rotates out of position. A boxer's fracture will usually heal within 6 weeks, if it is treated properly and protected from re-injury. Surgery is sometimes needed. A cast, splint, or bulky hand dressing may be used to protect and immobilize a boxer's fracture. Do not remove this device or dressing until your caregiver approves. Keep your hand elevated, and apply ice packs for 15-20 minutes every 2 hours, for the first 2 days. Elevation and ice help reduce swelling and relieve pain. See your caregiver, or an orthopedic specialist, for follow-up care within the next 10 days. This is to make sure your fracture is healing properly. Document Released: 10/31/2005 Document Revised: 01/23/2012 Document Reviewed: 04/20/2007 [facility] Patient Information 2014 [employer], [place].

## 2014-02-09 NOTE — ED Provider Notes (Signed)
Medical screening examination/treatment/procedure(s) were performed by non-physician practitioner and as supervising physician I was immediately available for consultation/collaboration.   Celene KrasJon R Osmond Steckman, MD 02/09/14 [phone]

## 2014-03-31 ENCOUNTER — Encounter (HOSPITAL_COMMUNITY): Payer: Self-pay | Admitting: Emergency Medicine

## 2014-03-31 ENCOUNTER — Emergency Department (INDEPENDENT_AMBULATORY_CARE_PROVIDER_SITE_OTHER)
Admission: EM | Admit: 2014-03-31 | Discharge: 2014-03-31 | Disposition: A | Discharge status: Home / Self Care | Payer: BC Managed Care – PPO | Source: Home / Self Care | Attending: Emergency Medicine | Admitting: Emergency Medicine

## 2014-03-31 DIAGNOSIS — M79609 Pain in unspecified limb: Secondary | ICD-10-CM

## 2014-03-31 DIAGNOSIS — M79643 Pain in unspecified hand: Secondary | ICD-10-CM

## 2014-03-31 MED ORDER — OXYCODONE-ACETAMINOPHEN 5-325 MG PO TABS: ORAL_TABLET | ORAL | Status: DC

## 2014-03-31 NOTE — Discharge Instructions (Signed)
Elevate hand, keep splint in place, take Motrin 800 mg every 8 hours with food, follow up with Dr. Mina MarbleWeingold tomorrow.

## 2014-03-31 NOTE — ED Notes (Signed)
Reports severe right hand pain since yesterday.  States recent surgery on hand 4/13.   No relief with motrin.   Denies injury.

## 2014-03-31 NOTE — ED Provider Notes (Signed)
  Chief Complaint   Chief Complaint  Patient presents with  . Hand Pain    History of Present Illness   Stacie Harper is a 33 year old female who sustained a boxer's fracture of the fifth metacarpal of her right hand on March 28 of this year. She went to the emergency room and was referred to Dr. Mina MarbleWeingold. She family underwent surgery on April 13 with internal fixation. The patient states that this is healing up well up until yesterday when she noted increasing pain. It's swollen and there is a diminished range of motion. The pain is a 10 over 10 right now. She's due to see Dr. Mina MarbleWeingold and is just requesting something for pain right now she denies any fever or chills.   Review of Systems   Other than as noted above, the patient denies any of the following symptoms: Systemic:  No fevers or chills. Musculoskeletal:  No joint pain or arthritis.  Neurological:  No muscular weakness or paresthesias.  PMFSH   Past medical history, family history, social history, meds, and allergies were reviewed.     Physical Examination   Vital signs:  BP 143/91  Pulse 97  Temp(Src) 97.5 F (36.4 C) (Oral)  Resp 12  SpO2 100% Gen:  Alert and oriented times 3.  In no distress. Musculoskeletal:  Exam of the hand reveals she has a K wire protruding from her hand. There is tenderness to palpation over the distal fifth metacarpal. There is no obvious deformity. No erythema, no purulent drainage.  Otherwise, all joints had a full a ROM with no swelling, bruising or deformity.  No edema, pulses full. Extremities were warm and pink.  Capillary refill was brisk.  Skin:  Clear, warm and dry.  No rash. Neuro:  Alert and oriented times 3.  Muscle strength was normal.  Sensation was intact to light touch.   Assessment   The encounter diagnosis was Hand pain.  Cause for the increase in pain may be due to inflammation, infection, or recurrent trauma. She has a fabricated wrist and hand splint in place.  And she will leave this in place. Since she's going to be seeing Dr. Mina MarbleWeingold tomorrow, I elected not to do any x-rays or blood work, leaving this up to his judgment. She really just comes in tonight to get something for pain.  Plan  1.  Meds:  The following meds were prescribed:   Discharge Medication List as of 03/31/2014  3:50 PM    START taking these medications   Details  oxyCODONE-acetaminophen (PERCOCET) 5-325 MG per tablet 1 to 2 tablets every 6 hours as needed for pain., Print        2.  Patient Education/Counseling:  The patient was given appropriate handouts, self care instructions, and instructed in symptomatic relief, including rest and activity, and elevation.   3.  Follow up:  The patient was told to follow up here if no better in 3 to 4 days, or sooner if becoming worse in any way, and given some red flag symptoms such as worsening pain, fever, swelling, or neurological symptoms which would prompt immediate return.        Reuben Likesavid C Nikie Cid, MD 03/31/14 [phone]

## 2014-06-08 ENCOUNTER — Encounter (HOSPITAL_COMMUNITY): Payer: Self-pay | Admitting: Emergency Medicine

## 2014-06-08 ENCOUNTER — Emergency Department (HOSPITAL_COMMUNITY)
Admission: EM | Admit: 2014-06-08 | Discharge: 2014-06-08 | Disposition: A | Discharge status: Home / Self Care | Payer: BC Managed Care – PPO | Attending: Emergency Medicine | Admitting: Emergency Medicine

## 2014-06-08 DIAGNOSIS — Z862 Personal history of diseases of the blood and blood-forming organs and certain disorders involving the immune mechanism: Secondary | ICD-10-CM | POA: Insufficient documentation

## 2014-06-08 DIAGNOSIS — Z87891 Personal history of nicotine dependence: Secondary | ICD-10-CM | POA: Insufficient documentation

## 2014-06-08 DIAGNOSIS — L0291 Cutaneous abscess, unspecified: Secondary | ICD-10-CM

## 2014-06-08 DIAGNOSIS — Z8639 Personal history of other endocrine, nutritional and metabolic disease: Secondary | ICD-10-CM | POA: Insufficient documentation

## 2014-06-08 DIAGNOSIS — L02419 Cutaneous abscess of limb, unspecified: Secondary | ICD-10-CM | POA: Insufficient documentation

## 2014-06-08 DIAGNOSIS — L03119 Cellulitis of unspecified part of limb: Principal | ICD-10-CM

## 2014-06-08 DIAGNOSIS — G43009 Migraine without aura, not intractable, without status migrainosus: Secondary | ICD-10-CM | POA: Insufficient documentation

## 2014-06-08 DIAGNOSIS — I10 Essential (primary) hypertension: Secondary | ICD-10-CM | POA: Insufficient documentation

## 2014-06-08 LAB — CBG MONITORING, ED: Glucose-Capillary: 96 mg/dL (ref 70–99)

## 2014-06-08 MED ORDER — SULFAMETHOXAZOLE-TMP DS 800-160 MG PO TABS: 1.0000 | ORAL_TABLET | Freq: Two times a day (BID) | ORAL | Status: DC

## 2014-06-08 MED ORDER — KETOROLAC TROMETHAMINE 30 MG/ML IJ SOLN
30.0000 mg | Freq: Once | INTRAMUSCULAR | Status: AC
  Administered 2014-06-08: 30 mg via INTRAVENOUS
  Filled 2014-06-08: qty 1

## 2014-06-08 MED ORDER — HYDROCODONE-ACETAMINOPHEN 5-325 MG PO TABS: 1.0000 | ORAL_TABLET | Freq: Four times a day (QID) | ORAL | Status: DC | PRN

## 2014-06-08 MED ORDER — DIPHENHYDRAMINE HCL 50 MG/ML IJ SOLN
25.0000 mg | Freq: Once | INTRAMUSCULAR | Status: AC
  Administered 2014-06-08: 25 mg via INTRAVENOUS
  Filled 2014-06-08: qty 1

## 2014-06-08 MED ORDER — PROCHLORPERAZINE EDISYLATE 5 MG/ML IJ SOLN
10.0000 mg | Freq: Four times a day (QID) | INTRAMUSCULAR | Status: DC | PRN
  Administered 2014-06-08: 10 mg via INTRAVENOUS
  Filled 2014-06-08: qty 2

## 2014-06-08 MED ORDER — SODIUM CHLORIDE 0.9 % IV SOLN
Freq: Once | INTRAVENOUS | Status: AC
Start: 2014-06-08 — End: 2014-06-08
  Administered 2014-06-08: 75 mL/h via INTRAVENOUS

## 2014-06-08 NOTE — ED Provider Notes (Signed)
CSN: [id number]     Arrival date & time 06/08/14  1824 History  This chart was scribed for non-physician provider Arthor Captain, PA-C, working with No att. providers found by Phillis Haggis, ED Scribe. This patient was seen in room TR07C/TR07C and patient care was started at 8:51 PM.    Chief Complaint  Patient presents with  . Abscess    right leg  . Migraine   Patient is a 33 y.o. female presenting with abscess and migraines. The history is provided by the patient. No language interpreter was used.  Abscess Location:  Leg Leg abscess location:  R leg Size:  5 Abscess quality: fluctuance, induration, painful, redness and warmth   Abscess quality: not draining   Red streaking: no   Pain details:    Duration:  4 days   Timing:  Constant   Progression:  Worsening Chronicity:  New Context: not diabetes, not immunosuppression, not injected drug use, not insect bite/sting (Possible?) and not skin injury   Relieved by:  Nothing Exacerbated by: touch, walking. Associated symptoms: headaches   Associated symptoms: no anorexia, no fatigue, no fever, no nausea and no vomiting   Risk factors: no prior abscess   Migraine This is a recurrent problem. The current episode started yesterday. The problem occurs constantly. The problem has been unchanged. Associated symptoms include headaches. Pertinent negatives include no chest pain and no abdominal pain. Nothing aggravates the symptoms. She has tried NSAIDs for the symptoms. The treatment provided no relief.  Migraine This is a recurrent problem. The current episode started yesterday. The problem occurs constantly. The problem has been unchanged. Associated symptoms include headaches. Pertinent negatives include no abdominal pain, anorexia, arthralgias, change in bowel habit, chest pain, chills, congestion, coughing, diaphoresis, fatigue, fever, joint swelling, myalgias, nausea, neck pain, numbness, rash, sore throat, swollen glands, urinary  symptoms, vertigo, visual change, vomiting or weakness. Nothing aggravates the symptoms. She has tried NSAIDs for the symptoms. The treatment provided no relief.  HPI Comments: Stacie Harper is a 33 y.o. female who presents to the Emergency Department complaining of right lower leg abscess and migraine onset 4 days ago. She states that she recognized that she got bit by what she believes was a spider on Wednesday, and the pain increased by the next day. She states that the wound hurts to the point where her walking is affected. She states that the wound has not drained, but has darkened in color and grown in size. She denies having a history of similar symptoms. She states that she had a migraine onset yesterday. She states that the headache is on both temporal lobes. She denies nausea, vomiting, visual disturbances or photophobia. She reports that the pain is worse in her leg than her head.  Past Medical History  Diagnosis Date  . Chronic headaches   . Allergy   . Hypertension   . Hyperlipidemia   . Migraines    Past Surgical History  Procedure Laterality Date  . Abdominal surgery  2006    mesh repair  . Abdominal hysterectomy  2003  . Cesarean section    . Cyst excision Right     thumb   Family History  Problem Relation Age of Onset  . Heart disease Mother   . Hypertension Mother   . Heart disease Father   . Hyperlipidemia Father   . Hypertension Father   . Diabetes Father   . Diabetes Sister   . Colon cancer Maternal Grandmother   . Hypertension  Other   . Hyperlipidemia Other   . Asthma Other   . Ovarian cancer Other   . Dermatomyositis Mother    History  Substance Use Topics  . Smoking status: Former Games developermoker  . Smokeless tobacco: Not on file  . Alcohol Use: No   OB History   Grav Para Term Preterm Abortions TAB SAB Ect Mult Living                 Review of Systems  Constitutional: Negative for fever, chills, diaphoresis and fatigue.  HENT: Negative for  congestion and sore throat.   Respiratory: Negative for cough.   Cardiovascular: Negative for chest pain.  Gastrointestinal: Negative for nausea, vomiting, abdominal pain, anorexia and change in bowel habit.  Musculoskeletal: Negative for arthralgias, joint swelling, myalgias and neck pain.  Skin: Positive for wound. Negative for rash.  Neurological: Positive for headaches. Negative for vertigo, weakness and numbness.   Allergies  Morphine and related and Zofran  Home Medications   Prior to Admission medications   Medication Sig Start Date End Date Taking? Authorizing Provider  HYDROcodone-acetaminophen (NORCO/VICODIN) 5-325 MG per tablet Take 1-2 tablets by mouth every 4 (four) hours as needed for moderate pain. 02/08/14   Phill MutterPeter S Dammen, PA-C  oxyCODONE-acetaminophen (PERCOCET) 5-325 MG per tablet 1 to 2 tablets every 6 hours as needed for pain. 03/31/14   Reuben Likesavid C Keller, MD   BP 153/95  Pulse 103  Temp(Src) 97.7 F (36.5 C) (Oral)  Resp 20  SpO2 94% Physical Exam  Nursing note and vitals reviewed. Constitutional: She is oriented to person, place, and time. She appears well-developed and well-nourished.  HENT:  Head: Normocephalic and atraumatic.  Eyes: EOM are normal.  Neck: Normal range of motion. Neck supple.  Cardiovascular: Normal rate.   Pulmonary/Chest: Effort normal.  Musculoskeletal: Normal range of motion.  Neurological: She is alert and oriented to person, place, and time.  Skin: Skin is warm and dry.  4 cm area tender to palpation, erythematous and indurated, central purulent head, no fluctuance, no streaking on right lower leg.   Psychiatric: She has a normal mood and affect. Her behavior is normal.    ED Course  Procedures (including critical care time) DIAGNOSTIC STUDIES: Oxygen Saturation is 94% on room air, adequate by my interpretation.    COORDINATION OF CARE: 8:53 PM-Discussed treatment plan which includes abscess drainage and IV fluids with pt at  bedside and pt agreed to plan.    Labs Review Labs Reviewed - No data to display  Imaging Review No results found.   EKG Interpretation None      .INCISION AND DRAINAGE Performed by: Arthor CaptainHarris, Kaelon Weekes Consent: Verbal consent obtained. Risks and benefits: risks, benefits and alternatives were discussed Type: abscess  Body area: leg  Anesthesia: local infiltration  Incision was made with a scalpel.  Local anesthetic: lidocaine 2% w epinephrine  Anesthetic total: 4 ml  Complexity: complex Blunt dissection to break up loculations  Drainage: purulent  Drainage amount: minimal   Patient tolerance: Patient tolerated the procedure well with no immediate complications.     MDM   Final diagnoses:  Migraine without aura and without status migrainosus, not intractable  Abscess   Pt HA treated and improved while in ED.  Presentation is like pts typical HA and non concerning for [facility], ICH, Meningitis, or temporal arteritis. Pt is afebrile with no focal neuro deficits, nuchal rigidity, or change in vision.  Patient with skin abscess amenable to incision and drainage.  Abscess was not large enough to warrant packing or drain,  wound recheck in 2 days.   Mild signs of cellulitis is surrounding skin.  Will d/c to home.  No antibiotic therapy is indicated at this time, however patient seems reliable and I will discharge her with abx in hand to use if sxs worsen. Discussed reasons to use abx or return to ed.    I personally performed the services described in this documentation, which was scribed in my presence. The recorded information has been reviewed and is accurate.    Arthor Captain, PA-C 06/09/14 1552

## 2014-06-08 NOTE — ED Notes (Signed)
Pt thinks a spider bite her on her right leg since Wednesday.  Pt feels like right leg is burning and has developed a migraine since

## 2014-06-08 NOTE — Discharge Instructions (Signed)
Abscess An abscess is an infected area that contains a collection of pus and debris.It can occur in almost any part of the body. An abscess is also known as a furuncle or boil. CAUSES  An abscess occurs when tissue gets infected. This can occur from blockage of oil or sweat glands, infection of hair follicles, or a minor injury to the skin. As the body tries to fight the infection, pus collects in the area and creates pressure under the skin. This pressure causes pain. People with weakened immune systems have difficulty fighting infections and get certain abscesses more often.  SYMPTOMS Usually an abscess develops on the skin and becomes a painful mass that is red, warm, and tender. If the abscess forms under the skin, you may feel a moveable soft area under the skin. Some abscesses break open (rupture) on their own, but most will continue to get worse without care. The infection can spread deeper into the body and eventually into the bloodstream, causing you to feel ill.  DIAGNOSIS  Your caregiver will take your medical history and perform a physical exam. A sample of fluid may also be taken from the abscess to determine what is causing your infection. TREATMENT  Your caregiver may prescribe antibiotic medicines to fight the infection. However, taking antibiotics alone usually does not cure an abscess. Your caregiver may need to make a small cut (incision) in the abscess to drain the pus. In some cases, gauze is packed into the abscess to reduce pain and to continue draining the area. HOME CARE INSTRUCTIONS   Only take over-the-counter or prescription medicines for pain, discomfort, or fever as directed by your caregiver.  If you were prescribed antibiotics, take them as directed. Finish them even if you start to feel better.  If gauze is used, follow your caregiver's directions for changing the gauze.  To avoid spreading the infection:  Keep your draining abscess covered with a  bandage.  Wash your hands well.  Do not share personal care items, towels, or whirlpools with others.  Avoid skin contact with others.  Keep your skin and clothes clean around the abscess.  Keep all follow-up appointments as directed by your caregiver. SEEK MEDICAL CARE IF:   You have increased pain, swelling, redness, fluid drainage, or bleeding.  You have muscle aches, chills, or a general ill feeling.  You have a fever. MAKE SURE YOU:   Understand these instructions.  Will watch your condition.  Will get help right away if you are not doing well or get worse. Document Released: 08/10/2005 Document Revised: 05/01/2012 Document Reviewed: 01/13/2012 [facility] Patient Information 2015 [place], [place]. This information is not intended to replace advice given to you by your health care provider. Make sure you discuss any questions you have with your health care provider.  Migraine Headache A migraine headache is an intense, throbbing pain on one or both sides of your head. A migraine can last for 30 minutes to several hours. CAUSES  The exact cause of a migraine headache is not always known. However, a migraine may be caused when nerves in the brain become irritated and release chemicals that cause inflammation. This causes pain. Certain things may also trigger migraines, such as:  Alcohol.  Smoking.  Stress.  Menstruation.  Aged cheeses.  Foods or drinks that contain nitrates, glutamate, aspartame, or tyramine.  Lack of sleep.  Chocolate.  Caffeine.  Hunger.  Physical exertion.  Fatigue.  Medicines used to treat chest pain (nitroglycerine), birth control pills,  estrogen, and some blood pressure medicines. SIGNS AND SYMPTOMS  Pain on one or both sides of your head.  Pulsating or throbbing pain.  Severe pain that prevents daily activities.  Pain that is aggravated by any physical activity.  Nausea, vomiting, or both.  Dizziness.  Pain with exposure  to bright lights, loud noises, or activity.  General sensitivity to bright lights, loud noises, or smells. Before you get a migraine, you may get warning signs that a migraine is coming (aura). An aura may include:  Seeing flashing lights.  Seeing bright spots, halos, or zigzag lines.  Having tunnel vision or blurred vision.  Having feelings of numbness or tingling.  Having trouble talking.  Having muscle weakness. DIAGNOSIS  A migraine headache is often diagnosed based on:  Symptoms.  Physical exam.  A CT scan or MRI of your head. These imaging tests cannot diagnose migraines, but they can help rule out other causes of headaches. TREATMENT Medicines may be given for pain and nausea. Medicines can also be given to help prevent recurrent migraines.  HOME CARE INSTRUCTIONS  Only take over-the-counter or prescription medicines for pain or discomfort as directed by your health care provider. The use of long-term narcotics is not recommended.  Lie down in a dark, quiet room when you have a migraine.  Keep a journal to find out what may trigger your migraine headaches. For example, write down:  What you eat and drink.  How much sleep you get.  Any change to your diet or medicines.  Limit alcohol consumption.  Quit smoking if you smoke.  Get 7-9 hours of sleep, or as recommended by your health care provider.  Limit stress.  Keep lights dim if bright lights bother you and make your migraines worse. SEEK IMMEDIATE MEDICAL CARE IF:   Your migraine becomes severe.  You have a fever.  You have a stiff neck.  You have vision loss.  You have muscular weakness or loss of muscle control.  You start losing your balance or have trouble walking.  You feel faint or pass out.  You have severe symptoms that are different from your first symptoms. MAKE SURE YOU:   Understand these instructions.  Will watch your condition.  Will get help right away if you are not doing  well or get worse. Document Released: 10/31/2005 Document Revised: 03/17/2014 Document Reviewed: 07/08/2013 [facility] Patient Information 2015 [place], [place]. This information is not intended to replace advice given to you by your health care provider. Make sure you discuss any questions you have with your health care provider.

## 2014-06-09 NOTE — ED Provider Notes (Signed)
Medical screening examination/treatment/procedure(s) were performed by non-physician practitioner and as supervising physician I was immediately available for consultation/collaboration.   EKG Interpretation None        Dagmar HaitWilliam Keyon Winnick, MD 06/09/14 2317

## 2014-06-10 ENCOUNTER — Emergency Department (INDEPENDENT_AMBULATORY_CARE_PROVIDER_SITE_OTHER)
Admission: EM | Admit: 2014-06-10 | Discharge: 2014-06-10 | Disposition: A | Discharge status: Home / Self Care | Payer: BC Managed Care – PPO | Source: Home / Self Care | Attending: Emergency Medicine | Admitting: Emergency Medicine

## 2014-06-10 ENCOUNTER — Encounter (HOSPITAL_COMMUNITY): Payer: Self-pay | Admitting: Emergency Medicine

## 2014-06-10 DIAGNOSIS — L02419 Cutaneous abscess of limb, unspecified: Secondary | ICD-10-CM

## 2014-06-10 DIAGNOSIS — L03119 Cellulitis of unspecified part of limb: Secondary | ICD-10-CM | POA: Diagnosis not present

## 2014-06-10 MED ORDER — OXYCODONE-ACETAMINOPHEN 5-325 MG PO TABS: ORAL_TABLET | ORAL | Status: DC

## 2014-06-10 NOTE — ED Provider Notes (Signed)
CSN: [id number]     Arrival date & time 06/10/14  1547 History   First MD Initiated Contact with Patient 06/10/14 1646     Chief Complaint  Patient presents with  . Leg Swelling   (Consider location/radiation/quality/duration/timing/severity/associated sxs/prior Treatment) HPI She is here today for wound recheck. She is in the emergency room 2 days ago for a bug bite on her right lower leg. At that time there was swelling, induration, redness, and a small area of fluctuance that was I&D. She is discharged with a prescription of antibiotics and instructions to fill it if her leg worsened. She states that her leg has been more swollen and more painful the last 2 days. She filled the Bactrim today and started it this morning. She denies any fevers or vomiting. She denies any additional drainage from the I&D site. She has no known risk factors for blood clot.   Past Medical History  Diagnosis Date  . Chronic headaches   . Allergy   . Hypertension   . Hyperlipidemia   . Migraines    Past Surgical History  Procedure Laterality Date  . Abdominal surgery  2006    mesh repair  . Abdominal hysterectomy  2003  . Cesarean section    . Cyst excision Right     thumb   Family History  Problem Relation Age of Onset  . Heart disease Mother   . Hypertension Mother   . Heart disease Father   . Hyperlipidemia Father   . Hypertension Father   . Diabetes Father   . Diabetes Sister   . Colon cancer Maternal Grandmother   . Hypertension Other   . Hyperlipidemia Other   . Asthma Other   . Ovarian cancer Other   . Dermatomyositis Mother    History  Substance Use Topics  . Smoking status: Former Games developermoker  . Smokeless tobacco: Not on file  . Alcohol Use: No   OB History   Grav Para Term Preterm Abortions TAB SAB Ect Mult Living                 Review of Systems  Constitutional: Negative.   Gastrointestinal: Negative.   Skin: Positive for wound.    Allergies  Morphine and related and  Zofran  Home Medications   Prior to Admission medications   Medication Sig Start Date End Date Taking? Authorizing Provider  HYDROcodone-acetaminophen (NORCO) 5-325 MG per tablet Take 1-2 tablets by mouth every 6 (six) hours as needed for moderate pain. 06/08/14   Arthor CaptainAbigail Harris, PA-C  HYDROcodone-acetaminophen (NORCO/VICODIN) 5-325 MG per tablet Take 1-2 tablets by mouth every 4 (four) hours as needed for moderate pain. 02/08/14   Phill MutterPeter S Dammen, PA-C  oxyCODONE-acetaminophen (PERCOCET) 5-325 MG per tablet 1 to 2 tablets every 6 hours as needed for pain. 06/10/14   Charm RingsErin J Taniya Dasher, MD  sulfamethoxazole-trimethoprim (BACTRIM DS) 800-160 MG per tablet Take 1 tablet by mouth 2 (two) times daily. 06/08/14   Abigail Harris, PA-C   BP 153/91  Pulse 90  Temp(Src) 98.5 F (36.9 C) (Oral)  Resp 20  SpO2 98% Physical Exam  Constitutional: She appears well-developed and well-nourished. No distress.  Skin:  1.5cm incision site on right lower leg with surrounding erythema.  Lower leg is diffusely tender with non-pitting edema.  1+ DP pulse.    ED Course  Procedures (including critical care time) Labs Review Labs Reviewed - No data to display  Imaging Review No results found.   MDM  1. Cellulitis and abscess of leg     Wells' score is 0. Based on patient's report, it seems the cellulitis of her leg is worsening. Instructed her to finish all the antibiotics prescribed by the ER physician. Recommended elevation and ice to help with swelling. Prescription for Percocet, #20 tablets, provided. Followup at urgent care or with PCP if not improving after 48 hours of antibiotics.    Charm Rings, MD 06/10/14 [phone]

## 2014-06-10 NOTE — ED Notes (Signed)
Pt       Reports       She  [facility]  [facility]   -  She  Had  An  i  And  D        And  Was  Placed  On  Anti  Biotics  The  Swelling is  Worse            And  The  Pain is  Worse  As  Well

## 2014-06-10 NOTE — Discharge Instructions (Signed)
It looks like you have an infection in your leg. Make sure you take all of the antibiotic. Elevate your leg as much as you can. Ice it 2-3 times a day to help with the swelling. Wash the site with warm, soapy water once a day and cover with bandaid. You should start to see improvement in the next 48 hours.  If it is worsening or you develop fevers, please come back or see your regular doctor.

## 2014-06-30 ENCOUNTER — Telehealth: Payer: Self-pay | Admitting: *Deleted

## 2014-06-30 NOTE — Telephone Encounter (Signed)
Left msg on triage requesting a referral to see back specialist. Called pt back inform her she will need to be seen b4 referral can be place. She stated pain is so bad that sometimes she is not able to walk. Inform pt we have sports med md here that can see her for her back. Made appt for Friday 07/04/14...Raechel Chute/lmb

## 2014-07-04 ENCOUNTER — Ambulatory Visit: Payer: BC Managed Care – PPO | Admitting: Family Medicine

## 2014-07-08 ENCOUNTER — Ambulatory Visit: Payer: BC Managed Care – PPO | Admitting: Family Medicine

## 2014-07-08 ENCOUNTER — Telehealth: Payer: Self-pay | Admitting: Family Medicine

## 2014-07-08 DIAGNOSIS — Z0289 Encounter for other administrative examinations: Secondary | ICD-10-CM

## 2014-07-08 NOTE — Telephone Encounter (Signed)
Noted  

## 2014-07-08 NOTE — Telephone Encounter (Signed)
Patient no showed for acute on 8/25.  Please advise.

## 2014-07-12 ENCOUNTER — Other Ambulatory Visit (HOSPITAL_COMMUNITY): Payer: Self-pay | Admitting: Emergency Medicine

## 2014-08-25 ENCOUNTER — Encounter: Payer: Self-pay | Admitting: Family

## 2014-08-25 ENCOUNTER — Ambulatory Visit (INDEPENDENT_AMBULATORY_CARE_PROVIDER_SITE_OTHER): Payer: BC Managed Care – PPO | Admitting: Family

## 2014-08-25 VITALS — BP 128/90 | HR 97 | Temp 98.4°F | Resp 18 | Ht 63.0 in | Wt 290.4 lb

## 2014-08-25 DIAGNOSIS — F4323 Adjustment disorder with mixed anxiety and depressed mood: Secondary | ICD-10-CM | POA: Insufficient documentation

## 2014-08-25 MED ORDER — ALPRAZOLAM 0.25 MG PO TABS: 0.2500 mg | ORAL_TABLET | Freq: Two times a day (BID) | ORAL | Status: DC | PRN
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
## 2014-08-25 NOTE — Assessment & Plan Note (Addendum)
Pt requests not to be on depression medications at this time. Discussed options of BuSpar and Xanax. Would like to try the Xanax because it can be only as needed as opposed to daily. Start Xanax 0.25 mg BID PRN for anxiety and sleep. Will be attending grief counseling with children. Discussed available community resources.  Instructed patient and family if symptoms of depression worsen or thoughts of suicide develop, to seek immediate medical attention. Will follow up in a month or sooner if needed.

## 2014-08-25 NOTE — Progress Notes (Signed)
Pre visit review using our clinic review tool, if applicable. No additional management support is needed unless otherwise documented below in the visit note. 

## 2014-08-25 NOTE — Patient Instructions (Signed)
Thank you for choosing [facility].  Summary/Instructions:   Please start taking the Xanax as needed for anxiety and to help with sleep.  You are doing excellent by starting with [facility] for grief counseling  If you experience any increase in depression or thoughts of harming yourself or others, please go to the Emergency Room.  If there is anything we can do, please do not hesitate to ask.

## 2015-04-09 ENCOUNTER — Emergency Department (INDEPENDENT_AMBULATORY_CARE_PROVIDER_SITE_OTHER)
Admission: EM | Admit: 2015-04-09 | Discharge: 2015-04-09 | Disposition: A | Discharge status: Home / Self Care | Payer: Self-pay | Source: Home / Self Care | Attending: Family Medicine | Admitting: Family Medicine

## 2015-04-09 ENCOUNTER — Encounter (HOSPITAL_COMMUNITY): Payer: Self-pay | Admitting: Emergency Medicine

## 2015-04-09 DIAGNOSIS — L259 Unspecified contact dermatitis, unspecified cause: Secondary | ICD-10-CM

## 2015-04-09 DIAGNOSIS — B372 Candidiasis of skin and nail: Secondary | ICD-10-CM

## 2015-04-09 MED ORDER — TRIAMCINOLONE ACETONIDE 0.1 % EX CREA
1.0000 | TOPICAL_CREAM | Freq: Two times a day (BID) | CUTANEOUS | Status: DC
Start: 2015-04-09 — End: 2016-07-15

## 2015-04-09 NOTE — ED Provider Notes (Signed)
CSN: [id number]     Arrival date & time 04/09/15  1714 History   First MD Initiated Contact with Patient 04/09/15 1811     Chief Complaint  Patient presents with  . Rash   (Consider location/radiation/quality/duration/timing/severity/associated sxs/prior Treatment) HPI Comments: 34 year old female complaining of a rash to the neck for 2-3 weeks. She states it is itchy and burns. She has unaware of anything that may have come in contact with her neck causes rash.  She also has a chronic intermittent rash to the axilla and inner thighs within the skin fold creases. These are darkened areas that become moist and occasionally burning. This is a different rash from that which is on her neck.   Past Medical History  Diagnosis Date  . Chronic headaches   . Allergy   . Hypertension   . Hyperlipidemia   . Migraines    Past Surgical History  Procedure Laterality Date  . Abdominal surgery  2006    mesh repair  . Abdominal hysterectomy  2003  . Cesarean section    . Cyst excision Right     thumb   Family History  Problem Relation Age of Onset  . Heart disease Mother   . Hypertension Mother   . Heart disease Father   . Hyperlipidemia Father   . Hypertension Father   . Diabetes Father   . Diabetes Sister   . Colon cancer Maternal Grandmother   . Hypertension Other   . Hyperlipidemia Other   . Asthma Other   . Ovarian cancer Other   . Dermatomyositis Mother    History  Substance Use Topics  . Smoking status: Former Games developermoker  . Smokeless tobacco: Not on file  . Alcohol Use: No   OB History    No data available     Review of Systems  Constitutional: Negative for fever, activity change and fatigue.  HENT: Negative.   Gastrointestinal: Negative.   Musculoskeletal: Negative.   Skin: Positive for rash.  Neurological: Negative.     Allergies  Morphine and related and Zofran  Home Medications   Prior to Admission medications   Medication Sig Start Date End Date Taking?  Authorizing Provider  ALPRAZolam (XANAX) 0.25 MG tablet Take 1 tablet (0.25 mg total) by mouth 2 (two) times daily as needed for anxiety. 08/25/14   Veryl SpeakGregory D Calone, FNP  HYDROcodone-acetaminophen (NORCO) 5-325 MG per tablet Take 1-2 tablets by mouth every 6 (six) hours as needed for moderate pain. 06/08/14   Arthor CaptainAbigail Harris, PA-C  HYDROcodone-acetaminophen (NORCO/VICODIN) 5-325 MG per tablet Take 1-2 tablets by mouth every 4 (four) hours as needed for moderate pain. 02/08/14   Ivonne AndrewPeter Dammen, PA-C  oxyCODONE-acetaminophen (PERCOCET) 5-325 MG per tablet 1 to 2 tablets every 6 hours as needed for pain. 06/10/14   Charm RingsErin J Honig, MD  sulfamethoxazole-trimethoprim (BACTRIM DS) 800-160 MG per tablet Take 1 tablet by mouth 2 (two) times daily. 06/08/14   Arthor CaptainAbigail Harris, PA-C  triamcinolone cream (KENALOG) 0.1 % Apply 1 application topically 2 (two) times daily. Around the neck rash 04/09/15   Hayden Rasmussenavid Tavia Stave, NP   BP 138/108 mmHg  Pulse 84  Temp(Src) 99 F (37.2 C) (Oral)  Resp 16  SpO2 98% Physical Exam  Constitutional: She is oriented to person, place, and time. She appears well-developed and well-nourished. No distress.  Neck: Normal range of motion. Neck supple.  Cardiovascular: Normal rate.   Pulmonary/Chest: Effort normal. No respiratory distress.  Musculoskeletal: Normal range of motion. She exhibits no edema.  Lymphadenopathy:    She has no cervical adenopathy.  Neurological: She is alert and oriented to person, place, and time. She exhibits normal muscle tone.  Skin: Skin is warm and dry. She is not diaphoretic.  There is a linear grouping of ovoid shaped lesions around the neck, both sides and posterior. It does not include the anterior neck. These isolated lesions are slightly ashen or gray in appearance a few a little scaly. Under magnification there are small papulovesicular lesions within the oval lesions.  The other rash which is chronic for several months is located along the skin creases  within the axilla and inner thighs. These are well marginated, darkened and often moist.  Nursing note and vitals reviewed.   ED Course  Procedures (including critical care time) Labs Review Labs Reviewed - No data to display  Imaging Review No results found.   MDM   1. Contact dermatitis   2. Monilial intertrigo      Lotrisone or Lamisil to intertrigo areas and  Triamcinolone to the neck.    Hayden Rasmussen, NP 04/09/15 [phone]

## 2015-04-09 NOTE — ED Notes (Signed)
Pt states that a rash started on her neck for 2 weeks that has spread

## 2015-04-09 NOTE — Discharge Instructions (Signed)
Contact Dermatitis Contact dermatitis is a rash that happens when something touches the skin. You touched something that irritates your skin, or you have allergies to something you touched. HOME CARE   Avoid the thing that caused your rash.  Keep your rash away from hot water, soap, sunlight, chemicals, and other things that might bother it.  Do not scratch your rash.  You can take cool baths to help stop itching.  Only take medicine as told by your doctor.  Keep all doctor visits as told. GET HELP RIGHT AWAY IF:   Your rash is not better after 3 days.  Your rash gets worse.  Your rash is puffy (swollen), tender, red, sore, or warm.  You have problems with your medicine. MAKE SURE YOU:   Understand these instructions.  Will watch your condition.  Will get help right away if you are not doing well or get worse. Document Released: 08/28/2009 Document Revised: 01/23/2012 Document Reviewed: 04/05/2011 [facility] Patient Information 2015 [place], [place]. This information is not intended to replace advice given to you by your health care provider. Make sure you discuss any questions you have with your health care provider.  Cutaneous Candidiasis Cutaneous candidiasis is a condition in which there is an overgrowth of yeast (candida) on the skin. Yeast normally live on the skin, but in small enough numbers not to cause any symptoms. In certain cases, increased growth of the yeast may cause an actual yeast infection. This kind of infection usually occurs in areas of the skin that are constantly warm and moist, such as the armpits or the groin. Yeast is the most common cause of diaper rash in babies and in people who cannot control their bowel movements (incontinence). CAUSES  The fungus that most often causes cutaneous candidiasis is Candida albicans. Conditions that can increase the risk of getting a yeast infection of the skin include:  Obesity.  Pregnancy.  Diabetes.  Taking  antibiotic medicine.  Taking birth control pills.  Taking steroid medicines.  Thyroid disease.  An iron or zinc deficiency.  Problems with the immune system. SYMPTOMS   Red, swollen area of the skin.  Bumps on the skin.  Itchiness. DIAGNOSIS  The diagnosis of cutaneous candidiasis is usually based on its appearance. Light scrapings of the skin may also be taken and viewed under a microscope to identify the presence of yeast. TREATMENT  Antifungal creams may be applied to the infected skin. In severe cases, oral medicines may be needed.  HOME CARE INSTRUCTIONS   Keep your skin clean and dry.  Maintain a healthy weight.  If you have diabetes, keep your blood sugar under control. SEEK IMMEDIATE MEDICAL CARE IF:  Your rash continues to spread despite treatment.  You have a fever, chills, or abdominal pain. Document Released: 07/19/2011 Document Revised: 01/23/2012 Document Reviewed: 07/19/2011 [facility] Patient Information 2015 [place], [place]. This information is not intended to replace advice given to you by your health care provider. Make sure you discuss any questions you have with your health care provider.

## 2015-11-15 HISTORY — PX: LIFT, ARM (COSMETIC): SHX4701

## 2016-04-06 ENCOUNTER — Emergency Department (HOSPITAL_COMMUNITY)
Admission: EM | Admit: 2016-04-06 | Discharge: 2016-04-06 | Disposition: A | Discharge status: Home / Self Care | Payer: Self-pay | Attending: Emergency Medicine | Admitting: Emergency Medicine

## 2016-04-06 ENCOUNTER — Encounter (HOSPITAL_COMMUNITY): Payer: Self-pay | Admitting: Emergency Medicine

## 2016-04-06 DIAGNOSIS — G8929 Other chronic pain: Secondary | ICD-10-CM | POA: Insufficient documentation

## 2016-04-06 DIAGNOSIS — Z791 Long term (current) use of non-steroidal anti-inflammatories (NSAID): Secondary | ICD-10-CM | POA: Insufficient documentation

## 2016-04-06 DIAGNOSIS — I10 Essential (primary) hypertension: Secondary | ICD-10-CM | POA: Insufficient documentation

## 2016-04-06 DIAGNOSIS — M2550 Pain in unspecified joint: Secondary | ICD-10-CM

## 2016-04-06 DIAGNOSIS — M791 Myalgia: Secondary | ICD-10-CM | POA: Insufficient documentation

## 2016-04-06 DIAGNOSIS — Z87891 Personal history of nicotine dependence: Secondary | ICD-10-CM | POA: Insufficient documentation

## 2016-04-06 DIAGNOSIS — Z7952 Long term (current) use of systemic steroids: Secondary | ICD-10-CM | POA: Insufficient documentation

## 2016-04-06 DIAGNOSIS — Z8639 Personal history of other endocrine, nutritional and metabolic disease: Secondary | ICD-10-CM | POA: Insufficient documentation

## 2016-04-06 MED ORDER — PREDNISONE 20 MG PO TABS: 40.0000 mg | ORAL_TABLET | Freq: Every day | ORAL | Status: DC

## 2016-04-06 NOTE — ED Provider Notes (Signed)
CSN: [id number]     Arrival date & time 04/06/16  1102 History  By signing my name below, I, Essence Howell, attest that this documentation has been prepared under the direction and in the presence of Roxy Horseman, PA-C. Electronically Signed: Charline Bills, ED Scribe 04/06/2016 at 11:51 AM.   Chief Complaint  Patient presents with  . Joint Pain   The history is provided by the patient. No language interpreter was used.   HPI Comments: Stacie Harper is a 35 y.o. female with PMHx of HTN and HLD, who presents to the Emergency Department with a chief complaint of gradually worsening joint pain that began three days ago, worsened since this morning. Pt states that her pain is the worst in her hands but is diffusely spread among all of her joints. Pt states she has had similar symptoms before but notes that this pain is much worse. Pt states she visited urgent care and her OB/GYN, Dr. Cherly Hensen, in [place] for the same symptoms. She reports that her pain in the past was caused from not taking estradiol, which she began at [facility] for hormone control from endometriosis, due to hearing that it caused cancer. Pt denies fever, recent illness, bug bites, swelling, warmth or redness in joints. She states that her mother has dermatomyositis.   Past Medical History  Diagnosis Date  . Chronic headaches   . Allergy   . Hypertension   . Hyperlipidemia   . Migraines    Past Surgical History  Procedure Laterality Date  . Abdominal surgery  2006    mesh repair  . Abdominal hysterectomy  2003  . Cesarean section    . Cyst excision Right     thumb   Family History  Problem Relation Age of Onset  . Heart disease Mother   . Hypertension Mother   . Heart disease Father   . Hyperlipidemia Father   . Hypertension Father   . Diabetes Father   . Diabetes Sister   . Colon cancer Maternal Grandmother   . Hypertension Other   . Hyperlipidemia Other   . Asthma Other   . Ovarian cancer Other   .  Dermatomyositis Mother    Social History  Substance Use Topics  . Smoking status: Former Games developer  . Smokeless tobacco: None  . Alcohol Use: No   OB History    No data available     Review of Systems  Constitutional: Negative for fever.  Musculoskeletal: Positive for myalgias and arthralgias. Negative for joint swelling.  Skin: Negative for color change.      Allergies  Morphine and related and Zofran  Home Medications   Prior to Admission medications   Medication Sig Start Date End Date Taking? Authorizing Provider  ALPRAZolam (XANAX) 0.25 MG tablet Take 1 tablet (0.25 mg total) by mouth 2 (two) times daily as needed for anxiety. 08/25/14   Veryl Speak, FNP  HYDROcodone-acetaminophen (NORCO) 5-325 MG per tablet Take 1-2 tablets by mouth every 6 (six) hours as needed for moderate pain. 06/08/14   Arthor Captain, PA-C  HYDROcodone-acetaminophen (NORCO/VICODIN) 5-325 MG per tablet Take 1-2 tablets by mouth every 4 (four) hours as needed for moderate pain. 02/08/14   Ivonne Andrew, PA-C  oxyCODONE-acetaminophen (PERCOCET) 5-325 MG per tablet 1 to 2 tablets every 6 hours as needed for pain. 06/10/14   Charm Rings, MD  sulfamethoxazole-trimethoprim (BACTRIM DS) 800-160 MG per tablet Take 1 tablet by mouth 2 (two) times daily. 06/08/14   Abigail  Harris, PA-C  triamcinolone cream (KENALOG) 0.1 % Apply 1 application topically 2 (two) times daily. Around the neck rash 04/09/15   Hayden Rasmussenavid Mabe, NP   Triage Vitals: BP 126/95 mmHg  Pulse 111  Temp(Src) 98.6 F (37 C) (Oral)  Resp 20  Ht 5\' 3"  (1.6 m)  Wt 220 lb (99.791 kg)  BMI 38.98 kg/m2  SpO2 100% Physical Exam Physical Exam  Constitutional: Pt appears well-developed and well-nourished. No distress.  HENT:  Head: Normocephalic and atraumatic.  Eyes: Conjunctivae are normal.  Neck: Normal range of motion.  Cardiovascular: Normal rate, regular rhythm and intact distal pulses.   Capillary refill < 3 sec  Pulmonary/Chest: Effort  normal and breath sounds normal.  Musculoskeletal: Pt exhibits tenderness. Pt exhibits no edema.  ROM: 5/5 in all extremities Neurological: Pt  is alert. Coordination normal.  Sensation 5/5 in all extremities Strength 5/5 in all extremities  Skin: Skin is warm and dry. Pt is not diaphoretic.  No cellulitis, no erythema, no evidence of septic joint, no bug bites, no rashes  Psychiatric: Pt has a normal mood and affect.  Nursing note and vitals reviewed.  ED Course  Procedures  DIAGNOSTIC STUDIES: Oxygen Saturation is 100% on RA, normal by my interpretation.    COORDINATION OF CARE: 11:38 AM-Discussed treatment plan which includes prednisone with pt at bedside and pt agreed to plan. Pt has been advised to follow up with a rheumatologist or OB/GYN.     MDM   Final diagnoses:  Arthralgia    Patient with Generalized joint pains 2-3 days. She states that she has had these symptoms before, and was treated with estrogen with good relief. She states that she discontinued the estrogen because she was afraid to get cancer. She reports family history of rheumatologic disease, but has never been diagnosed herself. She does not have any red, hot, or swollen joints today. She denies any bug bites, rashes, or fevers. Her vital signs are stable. I will give her some prednisone. Recommend follow-up with primary care and with rheumatology. Patient understands and agrees to plan. She is stable and ready for discharge.  I personally performed the services described in this documentation, which was scribed in my presence. The recorded information has been reviewed and is accurate.      Roxy HorsemanRobert Kvon Mcilhenny, PA-C 04/06/16 1156  Rolland PorterMark James, MD 04/20/16 [phone]
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
## 2016-04-06 NOTE — Discharge Instructions (Signed)

## 2016-07-15 ENCOUNTER — Encounter (HOSPITAL_COMMUNITY): Payer: Self-pay | Admitting: *Deleted

## 2016-07-15 ENCOUNTER — Ambulatory Visit (HOSPITAL_COMMUNITY)
Admission: EM | Admit: 2016-07-15 | Discharge: 2016-07-15 | Disposition: A | Discharge status: Home / Self Care | Payer: Self-pay | Attending: Family Medicine | Admitting: Family Medicine

## 2016-07-15 DIAGNOSIS — H6123 Impacted cerumen, bilateral: Secondary | ICD-10-CM

## 2016-07-15 DIAGNOSIS — L309 Dermatitis, unspecified: Secondary | ICD-10-CM

## 2016-07-15 MED ORDER — TRIAMCINOLONE ACETONIDE 0.1 % EX CREA: 1.0000 "application " | TOPICAL_CREAM | Freq: Two times a day (BID) | CUTANEOUS | 3 refills | Status: DC

## 2016-07-15 NOTE — ED Triage Notes (Signed)
Pt  Has  Two  Complaints   She  Has  A  Rash  That she  Had  About  1  Year ago  Was  rx  A  Cream it  Helped she  Stopped  It  And  The  Rash  Came  Back   -   She  Also  Reports    A   Sensation  Of  Pressure  And  Fullness  In  Both  Ears    For  3-4  Days

## 2016-07-15 NOTE — ED Provider Notes (Signed)
[facility]    CSN: [id number] Arrival date & time: 07/15/16  1445  First Provider Contact:  First MD Initiated Contact with Patient 07/15/16 1551        History   Chief Complaint Chief Complaint  Patient presents with  . Rash    HPI Stacie Harper is a 35 y.o. female.   The history is provided by the patient.  Rash  Location:  Head/neck and shoulder/arm Head/neck rash location:  R neck and L neck Shoulder/arm rash location:  L forearm and R forearm Quality: dryness, itchiness and scaling   Severity:  Mild Onset quality:  Gradual Duration:  1 month Progression:  Spreading Chronicity:  Chronic Context: not new detergent/soap   Relieved by:  Topical steroids Associated symptoms: no fever     Past Medical History:  Diagnosis Date  . Allergy   . Chronic headaches   . Hyperlipidemia   . Hypertension   . Migraines     Patient Active Problem List   Diagnosis Date Noted  . Adjustment reaction with anxiety and depression 08/25/2014  . Hyperlipidemia   . Abdominal pain, epigastric 07/08/2013  . GERD (gastroesophageal reflux disease) 07/08/2013  . Obese 07/08/2013    Past Surgical History:  Procedure Laterality Date  . ABDOMINAL HYSTERECTOMY  2003  . ABDOMINAL SURGERY  2006   mesh repair  . CESAREAN SECTION    . CYST EXCISION Right    thumb    OB History    No data available       Home Medications    Prior to Admission medications   Medication Sig Start Date End Date Taking? Authorizing Provider  ALPRAZolam (XANAX) 0.25 MG tablet Take 1 tablet (0.25 mg total) by mouth 2 (two) times daily as needed for anxiety. 08/25/14   Veryl SpeakGregory D Calone, FNP  HYDROcodone-acetaminophen (NORCO) 5-325 MG per tablet Take 1-2 tablets by mouth every 6 (six) hours as needed for moderate pain. 06/08/14   Arthor CaptainAbigail Harris, PA-C  HYDROcodone-acetaminophen (NORCO/VICODIN) 5-325 MG per tablet Take 1-2 tablets by mouth every 4 (four) hours as needed for moderate pain.  02/08/14   Ivonne AndrewPeter Dammen, PA-C  oxyCODONE-acetaminophen (PERCOCET) 5-325 MG per tablet 1 to 2 tablets every 6 hours as needed for pain. 06/10/14   Charm RingsErin J Honig, MD  predniSONE (DELTASONE) 20 MG tablet Take 2 tablets (40 mg total) by mouth daily. 04/06/16   Roxy Horsemanobert Browning, PA-C  sulfamethoxazole-trimethoprim (BACTRIM DS) 800-160 MG per tablet Take 1 tablet by mouth 2 (two) times daily. 06/08/14   Arthor CaptainAbigail Harris, PA-C  triamcinolone cream (KENALOG) 0.1 % Apply 1 application topically 2 (two) times daily. Around the neck rash 04/09/15   Hayden Rasmussenavid Mabe, NP    Family History Family History  Problem Relation Age of Onset  . Heart disease Mother   . Hypertension Mother   . Dermatomyositis Mother   . Heart disease Father   . Hyperlipidemia Father   . Hypertension Father   . Diabetes Father   . Diabetes Sister   . Colon cancer Maternal Grandmother   . Hypertension Other   . Hyperlipidemia Other   . Asthma Other   . Ovarian cancer Other     Social History Social History  Substance Use Topics  . Smoking status: Former Games developermoker  . Smokeless tobacco: Not on file  . Alcohol use No     Allergies   Morphine and related and Zofran [ondansetron hcl]   Review of Systems Review of Systems  Constitutional: Negative.  Negative for fever.  HENT: Positive for ear pain. Negative for ear discharge.   Skin: Positive for rash.  All other systems reviewed and are negative.    Physical Exam Triage Vital Signs ED Triage Vitals [07/15/16 1525]  Enc Vitals Group     BP 142/83     Pulse Rate 75     Resp 16     Temp 98.6 F (37 C)     Temp Source Oral     SpO2 100 %     Weight      Height      Head Circumference      Peak Flow      Pain Score      Pain Loc      Pain Edu?      Excl. in GC?    No data found.   Updated Vital Signs BP 142/83 (BP Location: Left Arm)   Pulse 75   Temp 98.6 F (37 C) (Oral)   Resp 16   SpO2 100%   Visual Acuity Right Eye Distance:   Left Eye Distance:     Bilateral Distance:    Right Eye Near:   Left Eye Near:    Bilateral Near:     Physical Exam  Constitutional: She is oriented to person, place, and time. She appears well-developed and well-nourished.  HENT:  Head: Normocephalic.  Mouth/Throat: Oropharynx is clear and moist.  Cerumen impaction bilat.  Neurological: She is alert and oriented to person, place, and time.  Skin: Skin is warm and dry. Rash noted.  Eczematoid patchy dry rash.  Nursing note and vitals reviewed.    UC Treatments / Results  Labs (all labs ordered are listed, but only abnormal results are displayed) Labs Reviewed - No data to display  EKG  EKG Interpretation None       Radiology No results found.  Procedures Procedures (including critical care time)  Medications Ordered in UC Medications - No data to display   Initial Impression / Assessment and Plan / UC Course  I have reviewed the triage vital signs and the nursing notes.  Pertinent labs & imaging results that were available during my care of the patient were reviewed by me and considered in my medical decision making (see chart for details).  Clinical Course      Final Clinical Impressions(s) / UC Diagnoses   Final diagnoses:  None    New Prescriptions New Prescriptions   No medications on file     Linna Hoff, MD 07/15/16 1611

## 2016-07-26 ENCOUNTER — Encounter (HOSPITAL_COMMUNITY): Payer: Self-pay | Admitting: Emergency Medicine

## 2016-07-26 ENCOUNTER — Ambulatory Visit (HOSPITAL_COMMUNITY)
Admission: EM | Admit: 2016-07-26 | Discharge: 2016-07-26 | Disposition: A | Discharge status: Home / Self Care | Payer: Self-pay | Attending: Family Medicine | Admitting: Family Medicine

## 2016-07-26 DIAGNOSIS — F4381 Prolonged grief disorder: Secondary | ICD-10-CM

## 2016-07-26 DIAGNOSIS — F4321 Adjustment disorder with depressed mood: Secondary | ICD-10-CM

## 2016-07-26 DIAGNOSIS — F4329 Adjustment disorder with other symptoms: Secondary | ICD-10-CM

## 2016-07-26 MED ORDER — ALPRAZOLAM 0.5 MG PO TABS: 0.5000 mg | ORAL_TABLET | Freq: Every evening | ORAL | 0 refills | Status: DC | PRN

## 2016-07-26 MED ORDER — TRAZODONE HCL 100 MG PO TABS: 100.0000 mg | ORAL_TABLET | Freq: Every day | ORAL | 0 refills | Status: DC

## 2016-07-26 NOTE — ED Notes (Signed)
Contacted ED form grief counseling resources.

## 2016-07-26 NOTE — ED Triage Notes (Signed)
Patient complains of 2 episodes of fluttering sensation in chest today.  Patient is tearful.  Patient has felt overwhelmed for a week.  Anniversary of the death of her spouse is coming up.  Patient is not sleeping well.

## 2016-07-26 NOTE — ED Provider Notes (Signed)
[facility]    CSN: [id number] Arrival date & time: 07/26/16  1709  First Provider Contact:  First MD Initiated Contact with Patient 07/26/16 1818        History   Chief Complaint Chief Complaint  Patient presents with  . Headache  . Palpitations    HPI Stacie Harper is a 35 y.o. female.    Mental Health Problem  Presenting symptoms: depression   Presenting symptoms: no suicidal thoughts, no suicidal threats and no suicide attempt   Degree of incapacity (severity):  Mild Onset quality:  Sudden Progression:  Unchanged Chronicity:  New (husband murdered upcoming on 2 yrs ago, has 3 teenage sons. pt did not take time to grieve adequately, now emotionally distraught.) Context: stressful life event   Associated symptoms: anxiety, headaches and insomnia     Past Medical History:  Diagnosis Date  . Allergy   . Chronic headaches   . Hyperlipidemia   . Hypertension   . Migraines     Patient Active Problem List   Diagnosis Date Noted  . Adjustment reaction with anxiety and depression 08/25/2014  . Hyperlipidemia   . Abdominal pain, epigastric 07/08/2013  . GERD (gastroesophageal reflux disease) 07/08/2013  . Obese 07/08/2013    Past Surgical History:  Procedure Laterality Date  . ABDOMINAL HYSTERECTOMY  2003  . ABDOMINAL SURGERY  2006   mesh repair  . CESAREAN SECTION    . CYST EXCISION Right    thumb    OB History    No data available       Home Medications    Prior to Admission medications   Medication Sig Start Date End Date Taking? Authorizing Provider  ALPRAZolam (XANAX) 0.25 MG tablet Take 1 tablet (0.25 mg total) by mouth 2 (two) times daily as needed for anxiety. 08/25/14   Veryl Speak, FNP  HYDROcodone-acetaminophen (NORCO) 5-325 MG per tablet Take 1-2 tablets by mouth every 6 (six) hours as needed for moderate pain. 06/08/14   Arthor Captain, PA-C  HYDROcodone-acetaminophen (NORCO/VICODIN) 5-325 MG per tablet Take 1-2 tablets  by mouth every 4 (four) hours as needed for moderate pain. 02/08/14   Ivonne Andrew, PA-C  oxyCODONE-acetaminophen (PERCOCET) 5-325 MG per tablet 1 to 2 tablets every 6 hours as needed for pain. 06/10/14   Charm Rings, MD  predniSONE (DELTASONE) 20 MG tablet Take 2 tablets (40 mg total) by mouth daily. 04/06/16   Roxy Horseman, PA-C  sulfamethoxazole-trimethoprim (BACTRIM DS) 800-160 MG per tablet Take 1 tablet by mouth 2 (two) times daily. 06/08/14   Arthor Captain, PA-C  triamcinolone cream (KENALOG) 0.1 % Apply 1 application topically 2 (two) times daily. 07/15/16   Linna Hoff, MD    Family History Family History  Problem Relation Age of Onset  . Heart disease Mother   . Hypertension Mother   . Dermatomyositis Mother   . Heart disease Father   . Hyperlipidemia Father   . Hypertension Father   . Diabetes Father   . Diabetes Sister   . Colon cancer Maternal Grandmother   . Hypertension Other   . Hyperlipidemia Other   . Asthma Other   . Ovarian cancer Other     Social History Social History  Substance Use Topics  . Smoking status: Former Games developer  . Smokeless tobacco: Not on file  . Alcohol use No     Allergies   Morphine and related and Zofran [ondansetron hcl]   Review of Systems Review of Systems  Constitutional:  Negative.   Cardiovascular: Positive for palpitations.  Neurological: Positive for headaches.  Psychiatric/Behavioral: Positive for sleep disturbance. Negative for suicidal ideas. The patient is nervous/anxious and has insomnia.   All other systems reviewed and are negative.    Physical Exam Triage Vital Signs ED Triage Vitals  Enc Vitals Group     BP 07/26/16 1754 (!) 195/121     Pulse Rate 07/26/16 1754 85     Resp 07/26/16 1754 16     Temp 07/26/16 1754 98.7 F (37.1 C)     Temp Source 07/26/16 1754 Oral     SpO2 07/26/16 1754 100 %     Weight --      Height --      Head Circumference --      Peak Flow --      Pain Score 07/26/16 1810 4      Pain Loc --      Pain Edu? --      Excl. in GC? --    No data found.   Updated Vital Signs BP (!) 195/121 (BP Location: Left Arm)   Pulse 85   Temp 98.7 F (37.1 C) (Oral)   Resp 16   SpO2 100%   Visual Acuity Right Eye Distance:   Left Eye Distance:   Bilateral Distance:    Right Eye Near:   Left Eye Near:    Bilateral Near:     Physical Exam  Constitutional: She is oriented to person, place, and time. She appears well-developed and well-nourished. She appears distressed.  Neck: Normal range of motion. Neck supple.  Cardiovascular: Normal rate and regular rhythm.   Pulmonary/Chest: Effort normal and breath sounds normal.  Neurological: She is alert and oriented to person, place, and time.  Skin: Skin is warm and dry.  Psychiatric: Her speech is normal and behavior is normal. Thought content normal. Her affect is labile. Cognition and memory are normal. She exhibits a depressed mood.  Nursing note and vitals reviewed.    UC Treatments / Results  Labs (all labs ordered are listed, but only abnormal results are displayed) Labs Reviewed - No data to display  EKG  EKG Interpretation None       Radiology No results found.  Procedures Procedures (including critical care time)  Medications Ordered in UC Medications - No data to display   Initial Impression / Assessment and Plan / UC Course  I have reviewed the triage vital signs and the nursing notes.  Pertinent labs & imaging results that were available during my care of the patient were reviewed by me and considered in my medical decision making (see chart for details).  Clinical Course      Final Clinical Impressions(s) / UC Diagnoses   Final diagnoses:  None    New Prescriptions New Prescriptions   No medications on file     Linna HoffJames D Leyda Vanderwerf, MD 07/26/16 1929

## 2017-06-27 ENCOUNTER — Encounter (HOSPITAL_COMMUNITY): Payer: Self-pay | Admitting: Emergency Medicine

## 2017-06-27 ENCOUNTER — Ambulatory Visit (HOSPITAL_COMMUNITY)
Admission: EM | Admit: 2017-06-27 | Discharge: 2017-06-27 | Disposition: A | Discharge status: Home / Self Care | Payer: Self-pay | Attending: Internal Medicine | Admitting: Internal Medicine

## 2017-06-27 DIAGNOSIS — T192XXA Foreign body in vulva and vagina, initial encounter: Secondary | ICD-10-CM

## 2017-06-27 NOTE — ED Provider Notes (Signed)
[facility]    CSN: [id number] Arrival date & time: 06/27/17  1201     History   Chief Complaint Chief Complaint  Patient presents with  . Foreign Body in Vagina    HPI Stacie Harper is a 36 y.o. female.   36 year old female comes in for 3 day history of condom in vagina. She states she has tried removing on her own, but has been unable to find it. She denies vaginal discharge, itching/pain, spotting. States she has a foreign body sensation. Some mild abdominal cramping. She has a history of hysterectomy. Denies fever, chills, night sweats. Denies nausea, vomiting, diarrhea, constipation. Denies urinary symptoms such as frequency, dysuria, hematuria.      Past Medical History:  Diagnosis Date  . Allergy   . Chronic headaches   . Hyperlipidemia   . Hypertension   . Migraines     Patient Active Problem List   Diagnosis Date Noted  . Adjustment reaction with anxiety and depression 08/25/2014  . Hyperlipidemia   . Abdominal pain, epigastric 07/08/2013  . GERD (gastroesophageal reflux disease) 07/08/2013  . Obese 07/08/2013    Past Surgical History:  Procedure Laterality Date  . ABDOMINAL HYSTERECTOMY  2003  . ABDOMINAL SURGERY  2006   mesh repair  . CESAREAN SECTION    . CYST EXCISION Right    thumb    OB History    No data available       Home Medications    Prior to Admission medications   Not on File    Family History Family History  Problem Relation Age of Onset  . Heart disease Mother   . Hypertension Mother   . Dermatomyositis Mother   . Heart disease Father   . Hyperlipidemia Father   . Hypertension Father   . Diabetes Father   . Diabetes Sister   . Colon cancer Maternal Grandmother   . Hypertension Other   . Hyperlipidemia Other   . Asthma Other   . Ovarian cancer Other     Social History Social History  Substance Use Topics  . Smoking status: Former Games developer  . Smokeless tobacco: Not on file  . Alcohol use No      Allergies   Morphine and related and Zofran [ondansetron hcl]   Review of Systems Review of Systems  Reason unable to perform ROS: See HPI as above.     Physical Exam Triage Vital Signs ED Triage Vitals [06/27/17 1243]  Enc Vitals Group     BP (!) 160/95     Pulse Rate 81     Resp 18     Temp 98.5 F (36.9 C)     Temp Source Oral     SpO2 99 %     Weight      Height      Head Circumference      Peak Flow      Pain Score 7     Pain Loc      Pain Edu?      Excl. in GC?    No data found.   Updated Vital Signs BP (!) 160/95 (BP Location: Left Arm)   Pulse 81   Temp 98.5 F (36.9 C) (Oral)   Resp 18   SpO2 99%    Physical Exam  Constitutional: She appears well-developed and well-nourished. No distress.  Genitourinary: No erythema, tenderness or bleeding in the vagina.  There is a foreign body in the vagina. No vaginal discharge found.  Genitourinary Comments: Patient with history of hysterectomy, no cervix noted.      UC Treatments / Results  Labs (all labs ordered are listed, but only abnormal results are displayed) Labs Reviewed - No data to display  EKG  EKG Interpretation None       Radiology No results found.  Procedures .Foreign Body Removal Date/Time: 06/27/2017 1:00 PM Performed by: Linward HeadlandYU, AMY V Authorized by: Eustace MooreMURRAY, LAURA W  Consent: Verbal consent obtained. Risks and benefits: risks, benefits and alternatives were discussed Consent given by: patient Body area: vagina Localization method: speculum Removal mechanism: ring forceps Complexity: simple 1 objects recovered. Objects recovered: condom Post-procedure assessment: foreign body removed Patient tolerance: Patient tolerated the procedure well with no immediate complications   (including critical care time)  Medications Ordered in UC Medications - No data to display   Initial Impression / Assessment and Plan / UC Course  I have reviewed the triage vital signs and the  nursing notes.  Pertinent labs & imaging results that were available during my care of the patient were reviewed by me and considered in my medical decision making (see chart for details).     Foreign body removed, patient tolerated procedure well. Patient without significant symptoms today, will defer further testing. Return precautions given.  Final Clinical Impressions(s) / UC Diagnoses   Final diagnoses:  Foreign body in vagina, initial encounter    New Prescriptions New Prescriptions   No medications on file      Lurline IdolYu, Amy V, PA-C 06/27/17 1302

## 2017-06-27 NOTE — Discharge Instructions (Signed)
Foreign body removed. Monitor for any worsening of symptoms, abdominal pain, nausea, vomiting, fever, vaginal discharge, urinary symptoms, follow up for reevaluation.

## 2017-06-27 NOTE — ED Triage Notes (Signed)
[facility] a [facility] having a condom lost in her vagina for 3 days.

## 2017-09-30 ENCOUNTER — Ambulatory Visit (HOSPITAL_COMMUNITY)
Admission: EM | Admit: 2017-09-30 | Discharge: 2017-09-30 | Disposition: A | Discharge status: Home / Self Care | Payer: Self-pay | Attending: Family Medicine | Admitting: Family Medicine

## 2017-09-30 ENCOUNTER — Encounter (HOSPITAL_COMMUNITY): Payer: Self-pay | Admitting: Family Medicine

## 2017-09-30 DIAGNOSIS — M25561 Pain in right knee: Secondary | ICD-10-CM

## 2017-09-30 DIAGNOSIS — M25562 Pain in left knee: Secondary | ICD-10-CM

## 2017-09-30 MED ORDER — PREDNISONE 10 MG PO TABS: ORAL_TABLET | ORAL | 0 refills | Status: AC

## 2017-09-30 NOTE — ED Triage Notes (Signed)
Pt here for bilateral knee pain that started yesterday. Reports no injury and hx of same in the past. Reports hx of fluid

## 2017-09-30 NOTE — Discharge Instructions (Signed)
Nice to meet you. Suggest you start on the prednisone once you have received it. It will give you relief fairly quickly. Ice, elevate and rest. FU if worsens.

## 2017-09-30 NOTE — ED Provider Notes (Signed)
[facility]    CSN: [id number] Arrival date & time: 09/30/17  1912     History   Chief Complaint Chief Complaint  Patient presents with  . Knee Pain    HPI Stacie Harper is a 36 y.o. female.   Presents with bilateral knee pain. Onset x 2 days with a prior remote history of bilateral knee pain in the past (as a teenager) No prior injuries are noted. No warmth or erythema is noted. Taking Motrin without relief.       Past Medical History:  Diagnosis Date  . Allergy   . Chronic headaches   . Hyperlipidemia   . Hypertension   . Migraines     Patient Active Problem List   Diagnosis Date Noted  . Adjustment reaction with anxiety and depression 08/25/2014  . Hyperlipidemia   . Abdominal pain, epigastric 07/08/2013  . GERD (gastroesophageal reflux disease) 07/08/2013  . Obese 07/08/2013    Past Surgical History:  Procedure Laterality Date  . ABDOMINAL HYSTERECTOMY  2003  . ABDOMINAL SURGERY  2006   mesh repair  . CESAREAN SECTION    . CYST EXCISION Right    thumb    OB History    No data available       Home Medications    Prior to Admission medications   Medication Sig Start Date End Date Taking? Authorizing Provider  predniSONE (DELTASONE) 10 MG tablet 4 tablets x 4 days, 3 tablets x 4 days, 2 tablets x 4 days, 1 tablet x 4 days then 1/2 tablet x 4 days then stop 09/30/17   Riki SheerYoung, Magdala Brahmbhatt G, PA-C    Family History Family History  Problem Relation Age of Onset  . Heart disease Mother   . Hypertension Mother   . Dermatomyositis Mother   . Heart disease Father   . Hyperlipidemia Father   . Hypertension Father   . Diabetes Father   . Diabetes Sister   . Colon cancer Maternal Grandmother   . Hypertension Other   . Hyperlipidemia Other   . Asthma Other   . Ovarian cancer Other     Social History Social History   Tobacco Use  . Smoking status: Former Games developermoker  . Smokeless tobacco: Never Used  Substance Use Topics  . Alcohol  use: No  . Drug use: No     Allergies   Morphine and related and Zofran [ondansetron hcl]   Review of Systems Review of Systems  Constitutional: Negative for fever.  Musculoskeletal: Positive for arthralgias.  Skin: Positive for rash.       Small plaques     Physical Exam Triage Vital Signs ED Triage Vitals [09/30/17 1924]  Enc Vitals Group     BP (!) 157/92     Pulse Rate 89     Resp 18     Temp 98.3 F (36.8 C)     Temp src      SpO2 100 %     Weight      Height      Head Circumference      Peak Flow      Pain Score      Pain Loc      Pain Edu?      Excl. in GC?    No data found.  Updated Vital Signs BP (!) 157/92   Pulse 89   Temp 98.3 F (36.8 C)   Resp 18   SpO2 100%   Visual Acuity Right Eye Distance:  Left Eye Distance:   Bilateral Distance:    Right Eye Near:   Left Eye Near:    Bilateral Near:     Physical Exam  Constitutional: She is oriented to person, place, and time. She appears well-developed and well-nourished. No distress.  Musculoskeletal: She exhibits edema and tenderness. She exhibits no deformity.  Bilateral knees are warm with mild effusion, pain along medial joint line. Full ROM without restriction  Neurological: She is alert and oriented to person, place, and time.  Skin: Skin is warm and dry. She is not diaphoretic.  Psychiatric: Her behavior is normal.  Nursing note and vitals reviewed.    UC Treatments / Results  Labs (all labs ordered are listed, but only abnormal results are displayed) Labs Reviewed - No data to display  EKG  EKG Interpretation None       Radiology No results found.  Procedures Procedures (including critical care time)  Medications Ordered in UC Medications - No data to display   Initial Impression / Assessment and Plan / UC Course  I have reviewed the triage vital signs and the nursing notes.  Pertinent labs & imaging results that were available during my care of the patient  were reviewed by me and considered in my medical decision making (see chart for details).   Suspect an inflammatory arthritis. She has a slight rash with questionable psoriasis. Treat with prednisone and if worsens or returns she may consider referral to Rheumatology. Otherwise ice and rest.  She has no insurance and is reluctant to get a work up just yet. Return as needed. Verbally instructed to f/u with PCP for elevated blood pressure.   Final Clinical Impressions(s) / UC Diagnoses   Final diagnoses:  Acute pain of both knees    ED Discharge Orders        Ordered    predniSONE (DELTASONE) 10 MG tablet     09/30/17 1941       Controlled Substance Prescriptions [place] Controlled Substance Registry consulted? Not Applicable   Sharin MonsYoung, Vala Raffo G, PA-C 09/30/17 1946

## 2017-10-30 ENCOUNTER — Encounter (HOSPITAL_BASED_OUTPATIENT_CLINIC_OR_DEPARTMENT_OTHER): Payer: Self-pay | Admitting: *Deleted

## 2017-10-30 ENCOUNTER — Emergency Department (HOSPITAL_BASED_OUTPATIENT_CLINIC_OR_DEPARTMENT_OTHER)
Admission: EM | Admit: 2017-10-30 | Discharge: 2017-10-30 | Disposition: A | Discharge status: Home / Self Care | Payer: Self-pay | Attending: Emergency Medicine | Admitting: Emergency Medicine

## 2017-10-30 ENCOUNTER — Ambulatory Visit (HOSPITAL_COMMUNITY): Admission: EM | Admit: 2017-10-30 | Discharge: 2017-10-30 | Discharge status: Against Medical Advice | Payer: Self-pay

## 2017-10-30 ENCOUNTER — Emergency Department (HOSPITAL_BASED_OUTPATIENT_CLINIC_OR_DEPARTMENT_OTHER): Payer: Self-pay

## 2017-10-30 ENCOUNTER — Other Ambulatory Visit: Payer: Self-pay

## 2017-10-30 DIAGNOSIS — I10 Essential (primary) hypertension: Secondary | ICD-10-CM | POA: Insufficient documentation

## 2017-10-30 DIAGNOSIS — Z87891 Personal history of nicotine dependence: Secondary | ICD-10-CM | POA: Insufficient documentation

## 2017-10-30 DIAGNOSIS — R0789 Other chest pain: Secondary | ICD-10-CM | POA: Insufficient documentation

## 2017-10-30 DIAGNOSIS — F419 Anxiety disorder, unspecified: Secondary | ICD-10-CM | POA: Insufficient documentation

## 2017-10-30 DIAGNOSIS — G43809 Other migraine, not intractable, without status migrainosus: Secondary | ICD-10-CM | POA: Insufficient documentation

## 2017-10-30 LAB — COMPREHENSIVE METABOLIC PANEL
ALT: 17 U/L (ref 14–54)
ANION GAP: 7 (ref 5–15)
AST: 19 U/L (ref 15–41)
Albumin: 4.3 g/dL (ref 3.5–5.0)
Alkaline Phosphatase: 84 U/L (ref 38–126)
BUN: 10 mg/dL (ref 6–20)
CHLORIDE: 105 mmol/L (ref 101–111)
CO2: 25 mmol/L (ref 22–32)
Calcium: 9.3 mg/dL (ref 8.9–10.3)
Creatinine, Ser: 0.95 mg/dL (ref 0.44–1.00)
GFR calc Af Amer: >60 mL/min (ref >60)
GFR calc non Af Amer: >60 mL/min (ref >60)
Glucose, Bld: 86 mg/dL (ref 65–99)
POTASSIUM: 3.6 mmol/L (ref 3.5–5.1)
Sodium: 137 mmol/L (ref 135–145)
Total Bilirubin: 0.7 mg/dL (ref 0.3–1.2)
Total Protein: 8 g/dL (ref 6.5–8.1)

## 2017-10-30 LAB — CBC WITH DIFFERENTIAL/PLATELET
Basophils Absolute: 0 10*3/uL (ref 0.0–0.1)
Basophils Relative: 0 %
EOS PCT: 2 %
Eosinophils Absolute: 0.1 10*3/uL (ref 0.0–0.7)
HCT: 41.8 % (ref 36.0–46.0)
Hemoglobin: 14.2 g/dL (ref 12.0–15.0)
LYMPHS PCT: 45 %
Lymphs Abs: 3.5 10*3/uL (ref 0.7–4.0)
MCH: 29.5 pg (ref 26.0–34.0)
MCHC: 34 g/dL (ref 30.0–36.0)
MCV: 86.9 fL (ref 78.0–100.0)
MONO ABS: 0.8 10*3/uL (ref 0.1–1.0)
Monocytes Relative: 11 %
Neutro Abs: 3.3 10*3/uL (ref 1.7–7.7)
Neutrophils Relative %: 42 %
PLATELETS: 231 10*3/uL (ref 150–400)
RBC: 4.81 MIL/uL (ref 3.87–5.11)
RDW: 14.8 % (ref 11.5–15.5)
WBC: 7.7 10*3/uL (ref 4.0–10.5)

## 2017-10-30 LAB — TROPONIN I: Troponin I: <0.03 ng/mL (ref <0.03)

## 2017-10-30 MED ORDER — PROMETHAZINE HCL 25 MG PO TABS: 25.0000 mg | ORAL_TABLET | Freq: Four times a day (QID) | ORAL | 0 refills | Status: AC | PRN

## 2017-10-30 MED ORDER — SODIUM CHLORIDE 0.9 % IV BOLUS (SEPSIS)
1000.0000 mL | Freq: Once | INTRAVENOUS | Status: AC
  Administered 2017-10-30: 1000 mL via INTRAVENOUS

## 2017-10-30 MED ORDER — CYCLOBENZAPRINE HCL 5 MG PO TABS: 5.0000 mg | ORAL_TABLET | Freq: Three times a day (TID) | ORAL | 0 refills | Status: AC | PRN

## 2017-10-30 MED ORDER — PROMETHAZINE HCL 25 MG/ML IJ SOLN
12.5000 mg | Freq: Once | INTRAMUSCULAR | Status: AC
  Administered 2017-10-30: 12.5 mg via INTRAVENOUS
  Filled 2017-10-30: qty 1

## 2017-10-30 MED ORDER — KETOROLAC TROMETHAMINE 30 MG/ML IJ SOLN
30.0000 mg | Freq: Once | INTRAMUSCULAR | Status: AC
  Administered 2017-10-30: 30 mg via INTRAVENOUS
  Filled 2017-10-30: qty 1

## 2017-10-30 NOTE — Discharge Instructions (Signed)
Take motrin for pain.   Take flexeril for chest wall pain.   Take phenergan as needed for headaches.   Stay hydrated   See your doctor  Return to ER if you have worse headaches, trouble speaking, chest pain, trouble breathing, thoughts of harming yourself or others

## 2017-10-30 NOTE — ED Provider Notes (Signed)
[facility] EMERGENCY DEPARTMENT Provider Note   CSN: [id number] Arrival date & time: 10/30/17  1434     History   Chief Complaint Chief Complaint  Patient presents with  . Chest Pain    HPI Stacie Harper is a 36 y.o. female history of migraines, hypertension here presenting with anxiety, left chest pain, headaches.  Patient states that she has been having left-sided breast pain and chest pain since yesterday.  Denies any radiation to the pain and denies any associated shortness of breath.  Denies any cough or fevers.  She states that her daughter ran away recently and she is under a lot of stress recently but denies any suicidal or homicidal ideations.  Denies any recent travel history of blood clots.  He states that since yesterday her head started hurting more and she has a history of migraines but denies any nausea or vomiting or fevers or trouble speaking or blurry vision.   The history is provided by the patient.    Past Medical History:  Diagnosis Date  . Allergy   . Chronic headaches   . Hyperlipidemia   . Hypertension   . Migraines     Patient Active Problem List   Diagnosis Date Noted  . Adjustment reaction with anxiety and depression 08/25/2014  . Hyperlipidemia   . Abdominal pain, epigastric 07/08/2013  . GERD (gastroesophageal reflux disease) 07/08/2013  . Obese 07/08/2013    Past Surgical History:  Procedure Laterality Date  . ABDOMINAL HYSTERECTOMY  2003  . ABDOMINAL SURGERY  2006   mesh repair  . CESAREAN SECTION    . CYST EXCISION Right    thumb    OB History    No data available       Home Medications    Prior to Admission medications   Medication Sig Start Date End Date Taking? Authorizing Provider  predniSONE (DELTASONE) 10 MG tablet 4 tablets x 4 days, 3 tablets x 4 days, 2 tablets x 4 days, 1 tablet x 4 days then 1/2 tablet x 4 days then stop 09/30/17   Riki SheerYoung, Michelle G, PA-C    Family History Family History  Problem  Relation Age of Onset  . Heart disease Mother   . Hypertension Mother   . Dermatomyositis Mother   . Heart disease Father   . Hyperlipidemia Father   . Hypertension Father   . Diabetes Father   . Diabetes Sister   . Colon cancer Maternal Grandmother   . Hypertension Other   . Hyperlipidemia Other   . Asthma Other   . Ovarian cancer Other     Social History Social History   Tobacco Use  . Smoking status: Former Games developermoker  . Smokeless tobacco: Never Used  Substance Use Topics  . Alcohol use: No  . Drug use: No     Allergies   Morphine and related and Zofran [ondansetron hcl]   Review of Systems Review of Systems  Cardiovascular: Positive for chest pain.  All other systems reviewed and are negative.    Physical Exam Updated Vital Signs BP (!) 160/86   Pulse 86   Temp 99 F (37.2 C) (Oral)   Resp 13   Ht 5\' 3"  (1.6 m)   Wt 104.3 kg (230 lb)   SpO2 100%   BMI 40.74 kg/m   Physical Exam  Constitutional: She is oriented to person, place, and time.  Anxious   HENT:  Head: Normocephalic.  Eyes: Pupils are equal, round, and reactive to  light.  Neck: Normal range of motion.  Cardiovascular: Normal rate, regular rhythm and normal pulses.  Pulmonary/Chest: Effort normal and breath sounds normal.  Reproducible tenderness L chest   Abdominal: Soft. Bowel sounds are normal.  Musculoskeletal: Normal range of motion.       Right lower leg: Normal. She exhibits no tenderness and no edema.       Left lower leg: Normal. She exhibits no tenderness and no edema.  Neurological: She is alert and oriented to person, place, and time. No cranial nerve deficit.  Skin: Skin is warm. Capillary refill takes less than 2 seconds.  Psychiatric:  Tearful, not suicidal   Nursing note and vitals reviewed.    ED Treatments / Results  Labs (all labs ordered are listed, but only abnormal results are displayed) Labs Reviewed  CBC WITH DIFFERENTIAL/PLATELET  COMPREHENSIVE METABOLIC  PANEL  TROPONIN I    EKG  EKG Interpretation  Date/Time:  Monday October 30 2017 14:39:16 EST Ventricular Rate:  83 PR Interval:  156 QRS Duration: 90 QT Interval:  390 QTC Calculation: 458 R Axis:   76 Text Interpretation:  Normal sinus rhythm Normal ECG No significant change since last tracing Confirmed by Richardean CanalYao, Geremy Rister H [phone]([id number]) on 10/30/2017 4:36:02 PM       Radiology Dg Chest 2 View  Result Date: 10/30/2017 CLINICAL DATA:  Constant left-sided chest pain and headaches since last night. EXAM: CHEST  2 VIEW COMPARISON:  Chest x-ray dated 10/30/2011. FINDINGS: Heart size and mediastinal contours are within normal limits. Lungs are clear. Lung volumes are normal. No pleural effusion or pneumothorax seen. Osseous structures about the chest are unremarkable. IMPRESSION: Normal chest x-ray. Electronically Signed   By: Bary RichardStan  Maynard M.D.   On: 10/30/2017 17:41    Procedures Procedures (including critical care time)  Medications Ordered in ED Medications  sodium chloride 0.9 % bolus 1,000 mL (1,000 mLs Intravenous New Bag/Given 10/30/17 1703)  ketorolac (TORADOL) 30 MG/ML injection 30 mg (30 mg Intravenous Given 10/30/17 1703)  promethazine (PHENERGAN) injection 12.5 mg (12.5 mg Intravenous Given 10/30/17 1704)     Initial Impression / Assessment and Plan / ED Course  I have reviewed the triage vital signs and the nursing notes.  Pertinent labs & imaging results that were available during my care of the patient were reviewed by me and considered in my medical decision making (see chart for details).     Stacie Harper is a 36 y.o. female here with L breast pain, headaches after stressful event. I think likely migraines and chest wall pain from panic attack. Neuro exam unremarkable, low suspicion for ACS or PE. She is not suicidal. Will check labs, trop x 1, CXR. Will give migraine cocktail and reassess.   5:54 PM Labs and CXR unremarkable. Felt better with migraine cocktail.  Requests prescription of phenergan for headaches. Will give flexeril for chest wall pain.   Final Clinical Impressions(s) / ED Diagnoses   Final diagnoses:  None    ED Discharge Orders    None       Charlynne PanderYao, Hesham Womac Hsienta, MD 10/30/17 1758

## 2017-10-30 NOTE — ED Triage Notes (Signed)
Pressure in her left breast since last night. She is holding her breast at triage. States she is under stress.

## 2017-10-30 NOTE — ED Triage Notes (Signed)
Med center called and said patient left right after registering and went to med center.

## 2018-02-17 ENCOUNTER — Ambulatory Visit (HOSPITAL_COMMUNITY)
Admission: EM | Admit: 2018-02-17 | Discharge: 2018-02-17 | Disposition: A | Discharge status: Home / Self Care | Payer: Self-pay | Attending: Internal Medicine | Admitting: Internal Medicine

## 2018-02-17 ENCOUNTER — Ambulatory Visit (INDEPENDENT_AMBULATORY_CARE_PROVIDER_SITE_OTHER): Payer: Self-pay

## 2018-02-17 ENCOUNTER — Encounter (HOSPITAL_COMMUNITY): Payer: Self-pay | Admitting: Emergency Medicine

## 2018-02-17 ENCOUNTER — Other Ambulatory Visit: Payer: Self-pay

## 2018-02-17 DIAGNOSIS — S63502A Unspecified sprain of left wrist, initial encounter: Secondary | ICD-10-CM

## 2018-02-17 MED ORDER — MELOXICAM 15 MG PO TABS: 15.0000 mg | ORAL_TABLET | Freq: Every day | ORAL | 0 refills | Status: AC
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
## 2020-04-15 ENCOUNTER — Emergency Department: Payer: BLUE CROSS/BLUE SHIELD

## 2020-04-15 ENCOUNTER — Observation Stay: Payer: BLUE CROSS/BLUE SHIELD

## 2020-04-15 ENCOUNTER — Observation Stay
Admission: EM | Admit: 2020-04-15 | Discharge: 2020-04-17 | Disposition: A | Discharge status: Home Health | Payer: BLUE CROSS/BLUE SHIELD | Attending: Surgical Critical Care | Admitting: Surgical Critical Care

## 2020-04-15 ENCOUNTER — Inpatient Hospital Stay: Payer: BLUE CROSS/BLUE SHIELD

## 2020-04-15 DIAGNOSIS — S22089A Unspecified fracture of T11-T12 vertebra, initial encounter for closed fracture: Secondary | ICD-10-CM | POA: Insufficient documentation

## 2020-04-15 DIAGNOSIS — W19XXXA Unspecified fall, initial encounter: Secondary | ICD-10-CM | POA: Diagnosis present

## 2020-04-15 DIAGNOSIS — Z885 Allergy status to narcotic agent status: Secondary | ICD-10-CM | POA: Insufficient documentation

## 2020-04-15 DIAGNOSIS — S32010A Wedge compression fracture of first lumbar vertebra, initial encounter for closed fracture: Secondary | ICD-10-CM | POA: Diagnosis present

## 2020-04-15 DIAGNOSIS — S22079A Unspecified fracture of T9-T10 vertebra, initial encounter for closed fracture: Secondary | ICD-10-CM | POA: Insufficient documentation

## 2020-04-15 DIAGNOSIS — Z6841 Body Mass Index (BMI) 40.0 and over, adult: Secondary | ICD-10-CM | POA: Insufficient documentation

## 2020-04-15 DIAGNOSIS — G8911 Acute pain due to trauma: Secondary | ICD-10-CM | POA: Diagnosis present

## 2020-04-15 DIAGNOSIS — W010XXA Fall on same level from slipping, tripping and stumbling without subsequent striking against object, initial encounter: Secondary | ICD-10-CM | POA: Insufficient documentation

## 2020-04-15 DIAGNOSIS — R030 Elevated blood-pressure reading, without diagnosis of hypertension: Secondary | ICD-10-CM | POA: Insufficient documentation

## 2020-04-15 DIAGNOSIS — M62838 Other muscle spasm: Secondary | ICD-10-CM | POA: Insufficient documentation

## 2020-04-15 DIAGNOSIS — E669 Obesity, unspecified: Secondary | ICD-10-CM | POA: Insufficient documentation

## 2020-04-15 DIAGNOSIS — M545 Low back pain: Secondary | ICD-10-CM | POA: Insufficient documentation

## 2020-04-15 DIAGNOSIS — S32019A Unspecified fracture of first lumbar vertebra, initial encounter for closed fracture: Principal | ICD-10-CM | POA: Insufficient documentation

## 2020-04-15 DIAGNOSIS — R339 Retention of urine, unspecified: Secondary | ICD-10-CM | POA: Insufficient documentation

## 2020-04-15 HISTORY — DX: Anxiety disorder, unspecified: F41.9

## 2020-04-15 HISTORY — DX: Depression, unspecified: F32.A

## 2020-04-15 LAB — CBC AND DIFFERENTIAL
Absolute NRBC: 0 10*3/uL (ref 0.00–0.00)
Basophils Absolute Automated: 0.02 10*3/uL (ref 0.00–0.08)
Basophils Automated: 0.3 %
Eosinophils Absolute Automated: 0.09 10*3/uL (ref 0.00–0.44)
Eosinophils Automated: 1.1 %
Hematocrit: 42.4 % (ref 34.7–43.7)
Hgb: 13.9 g/dL (ref 11.4–14.8)
Immature Granulocytes Absolute: 0.04 10*3/uL (ref 0.00–0.07)
Immature Granulocytes: 0.5 %
Lymphocytes Absolute Automated: 2.23 10*3/uL (ref 0.42–3.22)
Lymphocytes Automated: 28.1 %
MCH: 29.3 pg (ref 25.1–33.5)
MCHC: 32.8 g/dL (ref 31.5–35.8)
MCV: 89.5 fL (ref 78.0–96.0)
MPV: 10.9 fL (ref 8.9–12.5)
Monocytes Absolute Automated: 0.6 10*3/uL (ref 0.21–0.85)
Monocytes: 7.6 %
Neutrophils Absolute: 4.95 10*3/uL (ref 1.10–6.33)
Neutrophils: 62.4 %
Nucleated RBC: 0 /100 WBC (ref 0.0–0.0)
Platelets: 216 10*3/uL (ref 142–346)
RBC: 4.74 10*6/uL (ref 3.90–5.10)
RDW: 14 % (ref 11–15)
WBC: 7.93 10*3/uL (ref 3.10–9.50)

## 2020-04-15 LAB — GFR: EGFR: >60

## 2020-04-15 LAB — COMPREHENSIVE METABOLIC PANEL
ALT: 20 U/L (ref 0–55)
AST (SGOT): 20 U/L (ref 5–34)
Albumin/Globulin Ratio: 1.2 (ref 0.9–2.2)
Albumin: 4 g/dL (ref 3.5–5.0)
Alkaline Phosphatase: 98 U/L (ref 37–106)
Anion Gap: 15 (ref 5.0–15.0)
BUN: 11.2 mg/dL (ref 7.0–19.0)
Bilirubin, Total: 0.3 mg/dL (ref 0.2–1.2)
CO2: 23 mEq/L (ref 22–29)
Calcium: 9 mg/dL (ref 8.5–10.5)
Chloride: 104 mEq/L (ref 100–111)
Creatinine: 0.8 mg/dL (ref 0.6–1.0)
Globulin: 3.3 g/dL (ref 2.0–3.6)
Glucose: 86 mg/dL (ref 70–100)
Potassium: 4.2 mEq/L (ref 3.5–5.1)
Protein, Total: 7.3 g/dL (ref 6.0–8.3)
Sodium: 142 mEq/L (ref 136–145)

## 2020-04-15 MED ORDER — LIDOCAINE 5 % EX PTCH
1.00 | MEDICATED_PATCH | CUTANEOUS | Status: DC
Start: 2020-04-15 — End: 2020-04-17
  Administered 2020-04-15 – 2020-04-17 (×3): 1 via TRANSDERMAL
  Filled 2020-04-15 (×3): qty 1

## 2020-04-15 MED ORDER — OXYCODONE-ACETAMINOPHEN 5-325 MG PO TABS
1.00 | ORAL_TABLET | Freq: Once | ORAL | Status: AC
Start: 2020-04-15 — End: 2020-04-15
  Administered 2020-04-15: 08:00:00 1 via ORAL
  Filled 2020-04-15: qty 1

## 2020-04-15 MED ORDER — ACETAMINOPHEN 325 MG PO TABS
650.0000 mg | ORAL_TABLET | Freq: Once | ORAL | Status: AC
Start: 2020-04-15 — End: 2020-04-15
  Administered 2020-04-15: 12:00:00 650 mg via ORAL
  Filled 2020-04-15: qty 2

## 2020-04-15 MED ORDER — ACETAMINOPHEN 325 MG PO TABS
650.0000 mg | ORAL_TABLET | Freq: Four times a day (QID) | ORAL | Status: DC
Start: 2020-04-15 — End: 2020-04-17
  Administered 2020-04-15 – 2020-04-17 (×9): 650 mg via ORAL
  Filled 2020-04-15 (×9): qty 2

## 2020-04-15 MED ORDER — DIPHENHYDRAMINE HCL 25 MG PO CAPS
25.00 mg | ORAL_CAPSULE | Freq: Once | ORAL | Status: AC
Start: 2020-04-15 — End: 2020-04-15
  Administered 2020-04-15: 11:00:00 25 mg via ORAL
  Filled 2020-04-15: qty 1

## 2020-04-15 MED ORDER — MORPHINE SULFATE 4 MG/ML IJ/IV SOLN (WRAP)
4.0000 mg | Freq: Once | Status: AC
Start: 2020-04-15 — End: 2020-04-15
  Administered 2020-04-15: 11:00:00 4 mg via INTRAVENOUS
  Filled 2020-04-15: qty 1

## 2020-04-15 MED ORDER — OXYCODONE HCL 5 MG PO TABS
10.00 mg | ORAL_TABLET | Freq: Once | ORAL | Status: AC
Start: 2020-04-15 — End: 2020-04-15
  Administered 2020-04-15: 12:00:00 10 mg via ORAL
  Filled 2020-04-15: qty 2

## 2020-04-15 MED ORDER — CLONAZEPAM 1 MG PO TABS
1.0000 mg | ORAL_TABLET | Freq: Two times a day (BID) | ORAL | Status: DC | PRN
Start: 2020-04-15 — End: 2020-04-17

## 2020-04-15 MED ORDER — OXYCODONE HCL 5 MG PO TABS
5.0000 mg | ORAL_TABLET | ORAL | Status: DC | PRN
Start: 2020-04-15 — End: 2020-04-16

## 2020-04-15 MED ORDER — CYCLOBENZAPRINE HCL 10 MG PO TABS
10.00 mg | ORAL_TABLET | Freq: Once | ORAL | Status: AC
Start: 2020-04-15 — End: 2020-04-15
  Administered 2020-04-15: 10:00:00 10 mg via ORAL
  Filled 2020-04-15: qty 1

## 2020-04-15 MED ORDER — ENOXAPARIN SODIUM 60 MG/0.6ML SC SOLN
0.50 mg/kg | Freq: Two times a day (BID) | SUBCUTANEOUS | Status: DC
Start: 2020-04-15 — End: 2020-04-17
  Administered 2020-04-15 – 2020-04-17 (×4): 60 mg via SUBCUTANEOUS
  Filled 2020-04-15 (×7): qty 0.6

## 2020-04-15 MED ORDER — GABAPENTIN 300 MG PO CAPS
300.00 mg | ORAL_CAPSULE | Freq: Three times a day (TID) | ORAL | Status: DC
Start: 2020-04-15 — End: 2020-04-16
  Administered 2020-04-15 – 2020-04-16 (×3): 300 mg via ORAL
  Filled 2020-04-15 (×3): qty 1

## 2020-04-15 MED ORDER — CYCLOBENZAPRINE HCL 10 MG PO TABS
5.0000 mg | ORAL_TABLET | Freq: Three times a day (TID) | ORAL | Status: DC | PRN
Start: 2020-04-15 — End: 2020-04-16
  Administered 2020-04-15 – 2020-04-16 (×2): 5 mg via ORAL
  Filled 2020-04-15 (×2): qty 1

## 2020-04-15 MED ORDER — DIAZEPAM 5 MG PO TABS
10.00 mg | ORAL_TABLET | Freq: Once | ORAL | Status: AC
Start: 2020-04-15 — End: 2020-04-15
  Administered 2020-04-15: 03:00:00 10 mg via ORAL
  Filled 2020-04-15: qty 2

## 2020-04-15 MED ORDER — OXYCODONE HCL 5 MG PO TABS
10.0000 mg | ORAL_TABLET | ORAL | Status: DC | PRN
Start: 2020-04-15 — End: 2020-04-16
  Administered 2020-04-15 – 2020-04-16 (×6): 10 mg via ORAL
  Filled 2020-04-15 (×6): qty 2

## 2020-04-15 MED ORDER — SENNOSIDES-DOCUSATE SODIUM 8.6-50 MG PO TABS
1.0000 | ORAL_TABLET | Freq: Every evening | ORAL | Status: DC
Start: 2020-04-15 — End: 2020-04-17
  Administered 2020-04-15 – 2020-04-16 (×2): 1 via ORAL
  Filled 2020-04-15 (×2): qty 1

## 2020-04-15 MED ORDER — FENTANYL CITRATE (PF) 50 MCG/ML IJ SOLN (WRAP)
100.00 ug | Freq: Once | INTRAMUSCULAR | Status: AC
Start: 2020-04-15 — End: 2020-04-15
  Administered 2020-04-15: 04:00:00 100 ug via INTRAVENOUS
  Filled 2020-04-15: qty 2

## 2020-04-15 MED ORDER — VENLAFAXINE HCL 37.5 MG PO TABS
37.5000 mg | ORAL_TABLET | Freq: Two times a day (BID) | ORAL | Status: DC
Start: 2020-04-15 — End: 2020-04-17
  Administered 2020-04-15 – 2020-04-17 (×4): 37.5 mg via ORAL
  Filled 2020-04-15 (×8): qty 1

## 2020-04-15 MED ORDER — FENTANYL CITRATE (PF) 50 MCG/ML IJ SOLN (WRAP)
100.00 ug | Freq: Once | INTRAMUSCULAR | Status: AC
Start: 2020-04-15 — End: 2020-04-15
  Administered 2020-04-15: 06:00:00 100 ug via INTRAVENOUS
  Filled 2020-04-15: qty 2

## 2020-04-15 MED ORDER — IBUPROFEN 400 MG PO TABS
400.00 mg | ORAL_TABLET | Freq: Once | ORAL | Status: AC
Start: 2020-04-15 — End: 2020-04-15
  Administered 2020-04-15: 12:00:00 400 mg via ORAL
  Filled 2020-04-15: qty 1

## 2020-04-15 MED ORDER — GABAPENTIN 100 MG PO CAPS
300.00 mg | ORAL_CAPSULE | Freq: Once | ORAL | Status: AC
Start: 2020-04-15 — End: 2020-04-15
  Administered 2020-04-15: 10:00:00 300 mg via ORAL
  Filled 2020-04-15: qty 3

## 2020-04-15 MED ORDER — KETOROLAC TROMETHAMINE 30 MG/ML IJ SOLN
30.00 mg | Freq: Once | INTRAMUSCULAR | Status: AC
Start: 2020-04-15 — End: 2020-04-15
  Administered 2020-04-15: 03:00:00 30 mg via INTRAMUSCULAR
  Filled 2020-04-15: qty 1

## 2020-04-15 NOTE — Plan of Care (Signed)
NEUROSURGERY       Called by radiology, pt also w/ 3 subtle fx of lower thoracic vertebrae  This does not change the bracing plan    Upright xrays reviewed.   - lower thoracic minor compression fx's also viewed in lumbar x-ray; stable  Ok to be OOB w/ brace  Ok to don doff brace on edge of bed  Ok to shower w/o brace as long as sitting  F/u in 6 wks w/ repeat spine xrays    Call sooner if continued pain preventing ambulation and consider kyphoplasty with neurosurgery  Please call neurosurgery w/ questions or changes.    Maida Sale PA-C 2:28 PM 04/15/20

## 2020-04-15 NOTE — Progress Notes (Signed)
Name: Victoria Woodward     MRN: [id number]    Ht: Height: 160 cm (5\' 3" )  Wt:Weight: 112 kg (247 lb) Adm Date: 04/15/2020     Affected side: spine    Reason for Visit: evaluation, fitting and delivery    Observations: Patient was laying supine in bed with nurse present in the room.     Today's Session: fit and delivered. Before fitting don and doffing instructions were communicated to the patient. Patient was fit via log roll with assistance from patients nurse. Patient was able to tolerate the brace and brace was left on afterwards.     Brace Issued: [place] TLSO     Ordering Physician:     Delivery Receipt Signed by:        Patient/Caretaker is able to properly don and doff device  Yes          [address]  [place], [place] [address]  [phone]

## 2020-04-15 NOTE — Progress Notes (Signed)
Orthosis Wearing Schedule  Your Physician has prescribed a TLSO brace for you to wear when you are upright and out of the bed  DO NOT SLEEP in the TLSO  Wear something form fitting underneath the TLSO  Check your skin each time you put on and take off the TLSO    Adjustment Period  There is an adjustment period with nearly every type of orthosis.  The body is not used to wearing an orthosis, and needs to adjust to it gradually.  Typically, the orthotist will give you a wearing schedule to give your body adequate time to adjust to the orthosis.  It is important to adhere to the wearing schedule.  Over-wearing the brace at first can result in soreness and fatigue.    Wearing schedule - A typical wearing schedule is starting with 2 hours of wear per day, and gradually increasing by 2 hour increments each successive day.  If it begins to feel sore, you can remove it early for that day.   But do not over-wear the orthosis.    If your doctor has given specific wearing instructions, their instructions supersede these instructions.    If manufacturers wearing instructions were provided these are to be followed.  It is not recommended you operate a vehicle while wearing your orthosis as it can impede your driving.                                                                                               Need for Adjustment  Pay close attention to your skin when you remove the orthosis.  If you are wearing the orthosis correctly, and it is a good fit, you should have an overall pink or dark hue of the skin when the orthosis is removed.  However, the skin tone should quickly return to normal once removed.  If a red or dark spot stays for longer than 30 minutes after the orthosis is removed, you are in danger of developing a hot-spot or blister.  Also check for any bruising, callous or rash.  Please discontinue wear and schedule a follow up appointment with your orthotist.  The orthotist can make an adjustment that will  relieve this spot and restore proper fit and function to the brace.    Call our office if you need an adjustment, have questions or need to schedule follow up appointment.  Cleaning, Care and Maintenance  Plastic braces can be cleaned with soap and water and thoroughly washed.  Another option is to mix a half and half solution of alcohol and water and rub down the orthosis.  Wipe dry.    Braces with cloth should be hand washed with soap and water, and well rinsed.  Lay flat or hang to dry.      [address]     [phone](P)/[phone](F)         [place], [place]  [id number]

## 2020-04-15 NOTE — ED Notes (Signed)
Bed: S 5  Expected date:   Expected time:   Means of arrival:   Comments:  D[place]as[place], [place] txfr MRN: [id number]

## 2020-04-15 NOTE — ED Notes (Signed)
[facility] HOSPITAL EMERGENCY DEPT  ED NURSING NOTE FOR THE RECEIVING INPATIENT NURSE   ED NURSE Joey/MaryEllen   [facility] [id number]   ED CHARGE RN Velna Hatchet   ADMISSION INFORMATION   Victoria Woodward is a 39 y.o. female admitted with an ED diagnosis of:    1. Closed wedge compression fracture of L1 vertebra, initial encounter         Isolation: None   Allergies: Morphine   Holding Orders confirmed? Yes   Belongings Documented? Yes   Home medications sent to pharmacy confirmed? N/A   NURSING CARE   Patient Comes From:   Mental Status: Home Independent  alert and oriented   ADL: Independent with all ADLs   Ambulation: 2 person assist   Pertinent Information  and Safety Concerns: Ground level fall yesterday while playing with dog. Diagnosed with L1 fracture. TLSO back brace placed. Patient moving all extremities, denies numbness/tingling to all extremities. Unable to stand at this time due to pain. Given several doses of pain medication in ER but unable to keep pain controlled well enough for patient to be D/Ced at this time.      CT / NIH   CT Head ordered on this patient?  Yes   NIH/Dysphagia assessment done prior to admission? N/A   VITAL SIGNS (at the time of this note)      Vitals:    04/15/20 1142   BP: (!) 177/94   Pulse: 79   Resp: 19   Temp: 98.7 F (37.1 C)   SpO2: 95%

## 2020-04-15 NOTE — Consults (Signed)
NEUROSURGERY CONSULTATION    Date Time: 04/15/20 9:35 AM  Patient Name: Victoria Woodward  Requesting Physician: Michel Santee, MD  Consulting Physician: Dr. Rosemarie Beath   Covered By:  Neurosurgery Team A     Reason for Consultation:   L1 compression fracture    Assessment:   39 y.o. female presents 04/15/20 with back pain after a fall while playing with her dog. CT L spine reveals an acute L1 superior endplate compression fracture with minimal loss of height, no retropulsion, and no local kyphosis. No neurologic deficit on exam.    Plan:   Plan to treat with external stabilization  TLSO ordered, to be worn when OOB   - ok to don doff on edge of bed   - ok to shower w/o brace as long as seated   - upright x-rays in brace prior to ambulation    Pain control prn - admit if necessary  Follow up in 6 wks w/ repeat lumbar spine x-rays  Follow up sooner if pain worsens preventing ADLs and can consider kyphoplasty     History of Present Illness:     Chief Complaint   Patient presents with    Back Pain     Victoria Woodward is a 39 y.o. female who presents to the hospital on 04/15/2020 transferred from urgent care with L1 compression fracture. Apparently pt was playing with her dog when she tripped/ twisted her back and fell onto the floor. She was able to get up and go to bed but she was awoken with worsening back pain. The pain radiates to the hips but not down the legs. It is described as severe and sharp which worsens with movement. It improves with lying still and pain medication. There is no associated numbness/ tingling/ or weakness in the extremities. No bowel/bladder dysfunction. No saddle anesthesia. No neck or arm pain. She has never injured her back in the past.    Past Medical History:     Past Medical History:   Diagnosis Date    Anxiety     Depression      I reviewed the past medical history myself. The patient has no medical problems pertinent to this encounter.    Past Surgical History:   History  reviewed. No pertinent surgical history.     Family History:   History reviewed. No pertinent family history.    Social History:     Social History     Socioeconomic History    Marital status: Single     Spouse name: Not on file    Number of children: Not on file    Years of education: Not on file    Highest education level: Not on file   Occupational History    Not on file   Tobacco Use    Smoking status: Not on file   Substance and Sexual Activity    Alcohol use: Not on file    Drug use: Not on file    Sexual activity: Not on file   Other Topics Concern    Not on file   Social History Narrative    Not on file     Social Determinants of Health     Financial Resource Strain:     Difficulty of Paying Living Expenses:    Food Insecurity:     Worried About Running Out of Food in the Last Year:     Ran Out of Food in the Last Year:  Transportation Needs:     Freight forwarder (Medical):     Lack of Transportation (Non-Medical):    Physical Activity:     Days of Exercise per Week:     Minutes of Exercise per Session:    Stress:     Feeling of Stress :    Social Connections:     Frequency of Communication with Friends and Family:     Frequency of Social Gatherings with Friends and Family:     Attends Religious Services:     Active Member of Clubs or Organizations:     Attends Banker Meetings:     Marital Status:    Intimate Partner Violence:     Fear of Current or Ex-Partner:     Emotionally Abused:     Physically Abused:     Sexually Abused:      Reviewed and non-contributory to patient's current illness.   Lives w/ children  Works as Firefighter    Allergies:     Allergies   Allergen Reactions    Morphine      Medications:     Current Facility-Administered Medications   Medication Dose Route Frequency    lidocaine  1 patch Transdermal Q24H     Reviewed in chart.    Review of Systems:     A comprehensive review of systems was:   General ROS: negative for - chills,  fatigue or fever  Eyes: negative for - blurry vision, double vision  ENT: negative for - hearing loss, nasal discharge, sore throat  Psychological ROS: negative for - behavioral disorder, anxiety, depression  Respiratory ROS: no cough, shortness of breath, or wheezing  Cardiovascular ROS: no chest pain or dyspnea on exertion  Gastrointestinal ROS: no n/v/d  Genito-Urinary ROS: no dysuria, trouble voiding, or hematuria  Musculoskeletal ROS: negative for - joint pain  Skin: negative for - rash  Neurological: see HPI  All other systems were reviewed and are negative      Physical Exam:     Vitals:    04/15/20 0750   BP: (!) 163/105   Pulse: 75   Resp: 20   Temp: 98.5 F (36.9 C)   SpO2: 100%     General: No acute distress, cooperative with examination  Psychologic: Affect appropriate, judgment and insight consistent with situation, no delusions or hallucinations  Skin: Warm, dry, no obvious lesions or scars  Eyes: Sclerae anicteric, no conjunctival injection  ENT: No otorrhea, no rhinorrhea, trachea midline  Head: Normocephalic  Neck: No palpable masses  Musculoskeletal: Full ROM, normal muscle tone, no atrophy  Pulmonary: Normal respiratory effort, no audible wheezing  Cardiovascular: No pedal edema, pulses 2+ in bilat lower extremities  Abdominal: Non-tender to palpation, non-distended, no organomegaly, no palpable masses    Mental Status: The patient is awake, alert and oriented to person, place, and time.  Normal affect.  Follows commands  [employer] of knowledge appropriate.  Recent and remote memory are intact.  Attention span and concentration appear normal.  No delusions or hallucinations.  Language function is normal. There is no evidence of aphasia in conversational speech.    Cranial Nerves:  -CN II: Visual fields full to bedside confrontation  -CN III, IV, VI: Pupils equal, round, and reactive to light 3mm; extraocular movements intact; no ptosis, Conjugate gaze                                       -  CN V:  Facial sensation intact in V1 through V3 distributions  -CN VII: Face symmetric  -CN VIII: Hearing intact to conversational speech  -CN IX, X: Normal phonation.  -CN XI: Symmetric full strength of sternocleidomastoid and trapezius muscles  -CN XII: Tongue protrudes midline    Motor: Muscle tone normal without spasticity or flaccidity. No atrophy.    Pronator drift none                    UE's:       Deltoid Bicep Tricep Grip IO    Right 5 5 5 5 5     Left 5 5 5 5 5       LE's:       HF KE DF PF EHL    Right 5 5 5 5 5     Left 5 5 5 5 5        Sensory:  Light touch intact throughout     Reflexes:       Biceps Triceps BR Patellar Achilles    Right 2+ 2+ 2+ 2+ 2+    Left 2+ 2+ 2+ 2+ 2+       No Hoffmann's sign bilaterally  No Clonus bilaterally     Coordination: FTN intact. No tremors.     Gait: Not tested secondary to patient condition.    GCS 15     Labs Reviewed:     Lab Results   Component Value Date    WBC 7.93 04/15/2020    HGB 13.9 04/15/2020    HCT 42.4 04/15/2020    MCV 89.5 04/15/2020    PLT 216 04/15/2020     Lab Results   Component Value Date    NA 142 04/15/2020    K 4.2 04/15/2020    CL 104 04/15/2020    CO2 23 04/15/2020     No results found for: INR, PT  No results found for: ALCOHOL, AMPHETAMINUR, BARBITURATEU, BENZODIAZEUR, CANNABINOIDU, COCAINEUR, OPIATEUR, PCPUR    Rads:   All neuro imaging personally reviewed by myself.    Radiology Results (24 Hour)     Procedure Component Value Units Date/Time    CT L- Spine without Contrast [[id number]] Collected: 04/15/20 0400    Order Status: Completed Updated: 04/15/20 0411    Narrative:      HISTORY: Fall    COMPARISON:  No relevant prior examination available for comparison.    TECHNIQUE: CT of the lumbar spine WITHOUT contrast.  Sagittal and  coronal reformatted images were obtained. The following dose reduction  techniques were utilized: Automated exposure control and/or adjustment  of the mA and/or kV according to patient size, and the use of  iterative  reconstruction technique.    FINDINGS:   Acute L1 vertebral body compression fracture. Mild loss of height. No  retropulsion. Incomplete posterior arch at L1.    There is normal lumbar lordosis. No spondylolisthesis. No scoliosis.  Facets are aligned.        Impression:         Acute L1 vertebral body compression fracture. Mild loss of height. No  retropulsion.      COMMUNICATION: These urgent results were discussed with Dr. Arlana Lindau on 04/15/2020 4:01 AM.    Leontine Locket   04/15/2020 4:09 AM        Signed by: Maida Sale PA-C  Date/Time: 04/15/20 9:35 AM         I have reviewed the notes and examined the patient with  and performed by PA Sofie Hartigan, I concur with her/his documentation of Victoria Woodward.    Dr. Marlaine Hind    Cranial Nerves:  -CN II: Visual fields full to bedside confrontation  -CN III, IV, VI: Pupils equal, round, and reactive to light 3mm; extraocular movements intact; no ptosis, Conjugate gaze                                       -CN V: Facial sensation intact in V1 through V3 distributions  -CN VII: Face symmetric  -CN VIII: Hearing intact to conversational speech  -CN IX, X: Normal phonation.  -CN XI: Symmetric full strength of sternocleidomastoid and trapezius muscles  -CN XII: Tongue protrudes midline    Motor: Muscle tone normal without spasticity or flaccidity. No atrophy.    Pronator drift none                    UE's:       Deltoid Bicep Tricep Grip IO    Right 5 5 5 5 5     Left 5 5 5 5 5       LE's:       HF KE DF PF EHL    Right 5 5 5 5 5     Left 5 5 5 5 5        Sensory:  Light touch intact throughout     Reflexes:       Biceps Triceps BR Patellar Achilles    Right 2+ 2+ 2+ 2+ 2+    Left 2+ 2+ 2+ 2+ 2+       No Hoffmann's sign bilaterally  No Clonus bilaterally     Coordination: FTN intact. No tremors.     Gait: Not tested secondary to patient condition.    GCS 15      A/ L1 compression fracture  P/ Plan to treat with external stabilization  TLSO ordered,  to be worn when OOB   - ok to don doff on edge of bed   - ok to shower w/o brace as long as seated   - upright x-rays in brace prior to ambulation    Pain control prn - admit if necessary  Follow up in 6 wks w/ repeat lumbar spine x-rays  Follow up sooner if pain worsens preventing ADLs and can consider kyphoplasty      JFH

## 2020-04-15 NOTE — ED Notes (Signed)
[facility] EMERGENCY DEPARTMENT RESIDENT H&P       CLINICAL INFORMATION        HPI:      Chief Complaint: Back Pain  .    Victoria Woodward is a 39 y.o. female with pmhx of anxiety who presents following left vertebral fracture.  She was reported she was playing with her pet Victoria Woodward when she fell from a standing position and felt that she twisted her back before falling on the floor. She states she can stand up.  She dozed off thinking it would improve the pain however when she awoke a few hours later the pain worsened. She presented to urgent care for further evaluation.    At urgent care she was found to have a L1 vertebral fracture.  She was given fentanyl and toradol, and transferred to [facility] ED for neurosurgery evaluation.    On examination she states it is very painful 10 out of 10 in intensity located in the lumbar spine.  She denies urinary incontinence, stool incontinence, numbness, weakness, dizziness, or headaches.    Past medical history significant for anxiety and insomnia.  She takes Zofran 4 mg as needed, clonazepam 1 mg nightly, gabapentin 100 mg nightly, and venlafaxine 37.5 mg daily.  She does not have a PCP.      History obtained from: patient       Nursing (triage) note reviewed for the following pertinent information:  BIBA transfer from [facility]. Patient was playing with her dog last night and fell, ground level fall. Complains of lower back pain, diagnosed with L1 fracture at [facility]. Arrives AOx4, moving all extremities. Given Fentanyl during transport by EMS.      ROS:      Review of Systems   Respiratory: Negative for shortness of breath.    Cardiovascular: Negative for chest pain.   Gastrointestinal: Negative for abdominal pain.   Musculoskeletal: Positive for back pain.   Neurological: Negative for headaches.         Physical Exam:      Pulse 100   BP (!) 180/121   Resp 20   SpO2 96 %   Temp 97.9 F (36.6 C)    Physical Exam  Gen: AAO x 3.  Patient resting but appears  in discomfort  HEENT: Moist oral mucosa. PERRLA. No scleral icterus  Cardiac: S1 and S2 audible and regular. No murmurs or rubs present  Lungs: CTA x2  Abdomen: + BS in all four quadrants. NT/ND. No rigidity or guarding   Ext: Peripheral pulses +2. No edema or cyanosis present  Neuro: Answers questions appropriately, sensation symmetric and intact bilaterally, finger to nose intact, no pronator drift, CN II-XII intact, muscle strength 4/5 in UE/LE (limited by pain).   Skin: No petechiae, no ecchymoses, warm and dry            PAST HISTORY        Primary Care Provider: Pcp, None, MD        PMH/PSH:    .     Past Medical History:   Diagnosis Date    Anxiety     Depression        She has no past surgical history on file.      Social/Family History:      She has no history on file for tobacco use, alcohol use, and drug use.    History reviewed. No pertinent family history.      Listed Medications on Arrival:    .  Home Medications     Med List Status: Complete Set By: Lovie Chol, RN at 04/15/2020  2:57 AM                clonazePAM (KlonoPIN) 1 MG tablet     Take 1 mg by mouth 2 (two) times daily as needed     gabapentin (NEURONTIN) 100 MG capsule     Take 100 mg by mouth 3 (three) times daily     ondansetron (ZOFRAN) 4 MG tablet     Take 4 mg by mouth every 8 (eight) hours as needed for Nausea     venlafaxine (EFFEXOR) 37.5 MG tablet     Take 37.5 mg by mouth 2 (two) times daily         Allergies: She is allergic to morphine.            VISIT INFORMATION        Reassessments/Clinical Course:      Given percocet 5-325 mg      Conversations with Other Providers:        8:15 am: Paged neurosurgery PA, recommended TLSO brace and will see    9:00: spoke with NSGY PA, recommended brace with pain control and outpatient follow up. Given Percocet with minimal relief, will add in Gabapentin 300 mg. If refractory will start start PRN Flexeril.    10:00 am: back pain unchanged with above. Will give Flexeril and Morphine 4  mg IV x1. Will page CNS hospitalist for obs admission for pain mgmt    11:00 CNS Hospitalist answered, recommended trauma admission. Spoke with trauma resident, will see.     12:00: Pain remains, trauma will accept admission to obs      Medications Given in the ED:    .     ED Medication Orders (From admission, onward)    Start Ordered     Status Ordering Provider    04/15/20 0803 04/15/20 0802  oxyCODONE-acetaminophen (PERCOCET) 5-325 MG per tablet 1 tablet  Once     Route: Oral  Ordered Dose: 1 tablet     Last MAR action: Given DIBBLE, BRENT A    04/15/20 0555 04/15/20 0554  fentaNYL (PF) (SUBLIMAZE) injection 100 mcg  Once     Route: Intravenous  Ordered Dose: 100 mcg     Last MAR action: Given BANASZAK, [facility]    04/15/20 0423 04/15/20 0422  fentaNYL (PF) (SUBLIMAZE) injection 100 mcg  Once     Route: Intravenous  Ordered Dose: 100 mcg     Last MAR action: Given BANASZAK, [facility]    04/15/20 0257 04/15/20 0256  lidocaine (LIDODERM) 5 % 1 patch  Every 24 hours     Route: Transdermal  Ordered Dose: 1 patch     Last MAR action: Patch Applied BANASZAK, [facility]    04/15/20 0257 04/15/20 0256  ketorolac (TORADOL) injection 30 mg  Once     Route: Intramuscular  Ordered Dose: 30 mg     Last MAR action: Given BANASZAK, [facility]    04/15/20 0257 04/15/20 0256  diazePAM (VALIUM) tablet 10 mg  Once     Route: Oral  Ordered Dose: 10 mg     Last MAR action: Given BANASZAK, DAMIAN            Procedures:      Procedures      Assessment/Plan:      Victoria Woodward is a 39 y/o lady admitted for L1 vertebral fracture, with pain refractory to given regimen.  Will admit to trauma for pain management.            Marchia Bond, Benjamine Mola, MD  Resident  04/15/20 1217       Marchia Bond, Benjamine Mola, MD  Resident  04/15/20 1217       Michel Santee, MD  04/15/20 1419

## 2020-04-15 NOTE — ED Provider Notes (Signed)
[facility] EMERGENCY DEPARTMENT H&P      Visit date: 04/15/2020      CLINICAL SUMMARY          Diagnosis:    .     Final diagnoses:   None         MDM Notes:      I am the first provider for this patient.  I reviewed the vital signs, nursing notes, past medical history, past surgical history, family history and social history.  I have reviewed the patient's previous charts.         Disposition:         Transfer to [facility] ED                CLINICAL INFORMATION        HPI:      Chief Complaint: No chief complaint on file.  Victoria Woodward is a 39 y.o. female who presents with a trip and fall backwards onto her lumbar spine few hours ago.  Patient has had pain in her lumbar spine since.  She has not tried any analgesics.  She has had no bowel or bladder complaints.  The pain is not radiating down her legs.  No recent fever, cough, SOB, chest or abd pain, no n/v/d, loss of taste or smell, or urinary c/o's. No direct COVID exposure.    History obtained from: Patient          ROS:      Positive and Negative ROS elements as per HPI  All Other Systems Reviewed and Negative: Yes      Physical Exam:      Pulse     BP     Resp     SpO2     Temp      Physical Exam   Nursing note and vitals reviewed.  Constitutional:Pt is oriented to person, place, and time. Pt appears well-developed and well-nourished.  Moderate distress.   HEENT:   Head: Normocephalic and atraumatic.   Right Ear: External ear normal.   Left Ear: External ear normal.    Nose: Nose normal.   Mouth/Throat: Oropharynx is clear and moist. No oropharyngeal exudate.   Eyes: Conjunctivae and EOM are normal. Pupils are equal, round, and reactive to light. Right eye exhibits no discharge. Left eye exhibits no discharge. No scleral icterus.   Neck: Normal range of motion. Neck supple. No JVD present. No tracheal deviation present. No thyromegaly present.   Cardiovascular: Normal rate, regular rhythm, normal heart sounds and intact distal pulses.  Exam reveals  no gallop and no friction rub.    No murmur heard.  Pulmonary/Chest: Effort normal and breath sounds normal. No stridor. No respiratory distress. Pt has no wheezes. Pt exhibits no tenderness.   Abdominal: There is no tenderness. Soft. Pt exhibits no distension and no mass.  There is no rebound and no guarding.   Musculoskeletal: Tender over lumbar sacral spine. no edema, No calf tenderness or swelling. Distal pulses intact  Neurological:  No cranial nerve deficit. Pt exhibits normal muscle tone. Coordination normal. MAE.  Skin: Skin is warm and dry. Pt is not diaphoretic.   Psychiatric: Pt has a normal mood and affect. Behavior is normal. Judgment and thought content normal            PAST HISTORY        Primary Care Provider: No primary care provider on file.  PMH/PSH:    .     No past medical history on file.    She has no past surgical history on file.      Social/Family History:      She has no history on file for tobacco use, alcohol use, and drug use.    No family history on file.      Listed Medications on Arrival:    .     Home Medications     None on File         Allergies: She has no allergies on file.            VISIT INFORMATION        Clinical Course in the ED:             Medications Given in the ED:    .     ED Medication Orders (From admission, onward)    None            Procedures:      Procedures      Interpretations:      Pulse ox reviewed by me. All ordered labs and images reviewed by me.     O2 sat-           saturation: 96 %; Oxygen use: room air; Interpretation: Normal                   RESULTS        Lab Results:      Results     ** No results found for the last 24 hours. **              Radiology Results:      No orders to display               Scribe Attestation:      I was acting as a Neurosurgeon for Arlana Lindau, MD on Victoria Woodward,Victoria Woodward     I am the first provider for this patient and I personally performed the services documented.  is scribing for me on Victoria Woodward,Victoria Woodward. This  note accurately reflects work and decisions made by me.  Arlana Lindau, MD                Arlana Lindau, MD  04/15/20 [phone]

## 2020-04-15 NOTE — H&P (Signed)
TRAUMA HISTORY AND PHYSICAL     Date Time: 04/15/20 1:08 PM  Patient Name: Victoria Woodward  Attending Physician: Vernell Leep, MD  Primary Care Physician: Marisa Sprinkles, MD    Date of Admission:   04/15/2020  2:42 AM    Trauma Level:   Consult    Assessment/Plan:   The patient has the following active problems:  Active Hospital Problems    Diagnosis    Closed wedge compression fracture of L1 vertebra, initial encounter    Pain, acute due to trauma    Fall        Plan by systems:  Neuro: L1 compression fx. T-spine imaging pending. TLSO brace and f/u with NSGY  Pulm: Sating well on RA. Encourage IS. OOB to chair   CV: Monitor vitals   Endo: Monitor glucose PRN   GI: Regular diet. Bowel reg   Heme/ID: Afebrile. No abx indicated at this time   Renal: Voiding. Monitor intake and output   Neuromuscular: Unable to ambulate 2/2 pain. PT/OT   Psych: Supportive. Continue home meds for depression   Wounds:  LWC PRN     Patient will be admitted to: [facility]  Massive transfusion protocol:  No      Consulting Services:   Neurosurgery - [place]     Patient Complaint:   Victoria Woodward is a 39 y.o. female who presents to the hospital after Other: Fall while playing with dog. Incident occured around 11 PM last night. She slept, but then was not able to move 2/2 pain. She went to urgent care where imaging showed an L spine fx. She was transferred here         The medications, past medical/surgical history, family history, allergies & full review of systems were:  Reviewed    Allergies:     Allergies   Allergen Reactions    Morphine Itching       Medication:   (Not in a hospital admission)      Past Medical History:     Past Medical History:   Diagnosis Date    Anxiety     Depression        Past Surgical History:   History reviewed. No pertinent surgical history.    Family History:   History reviewed. No pertinent family history.    Social History:     Social History     Socioeconomic History    Marital status: Single      Spouse name: Not on file    Number of children: Not on file    Years of education: Not on file    Highest education level: Not on file   Occupational History    Not on file   Tobacco Use    Smoking status: Not on file   Substance and Sexual Activity    Alcohol use: Not on file    Drug use: Not on file    Sexual activity: Not on file   Other Topics Concern    Not on file   Social History Narrative    Not on file     Social Determinants of Health     Financial Resource Strain:     Difficulty of Paying Living Expenses:    Food Insecurity:     Worried About Running Out of Food in the Last Year:     Ran Out of Food in the Last Year:    Transportation Needs:     Freight forwarder (Medical):  Lack of Transportation (Non-Medical):    Physical Activity:     Days of Exercise per Week:     Minutes of Exercise per Session:    Stress:     Feeling of Stress :    Social Connections:     Frequency of Communication with Friends and Family:     Frequency of Social Gatherings with Friends and Family:     Attends Religious Services:     Active Member of Clubs or Organizations:     Attends Engineer, structural:     Marital Status:    Intimate Partner Violence:     Fear of Current or Ex-Partner:     Emotionally Abused:     Physically Abused:     Sexually Abused:        Vaccination:   Tetanus up to date: Unknown    Review of Systems:   Review of Systems   All other systems reviewed and are negative.      Physical Exam:   Physical Exam  Vitals and nursing note reviewed.   Constitutional:       Appearance: Normal appearance. She is obese.   HENT:      Head: Normocephalic and atraumatic.      Nose: Nose normal.      Mouth/Throat:      Mouth: Mucous membranes are moist.   Eyes:      Extraocular Movements: Extraocular movements intact.      Conjunctiva/sclera: Conjunctivae normal.      Pupils: Pupils are equal, round, and reactive to light.   Neck:      Comments: C-spine non-tender.    Cardiovascular:      Rate and Rhythm: Normal rate.      Pulses: Normal pulses.      Heart sounds: No murmur.   Pulmonary:      Effort: Pulmonary effort is normal. No respiratory distress.      Breath sounds: Normal breath sounds. No wheezing.   Chest:      Chest wall: No tenderness.   Abdominal:      General: Bowel sounds are normal. There is no distension.      Palpations: Abdomen is soft.      Tenderness: There is no abdominal tenderness. There is no guarding.   Genitourinary:     Comments: No foley   Pelvis stable   Musculoskeletal:         General: Tenderness present. No swelling. Normal range of motion.      Cervical back: Normal range of motion and neck supple. No tenderness.      Comments: T/L spine TTP   Pt with 5/5 strength to b/l upper and LE   Normal sensation, Normal ROM  Denies paresthesias   Good cap refill, extremities warm and well perfused     Skin:     General: Skin is warm and dry.      Capillary Refill: Capillary refill takes less than 2 seconds.   Neurological:      General: No focal deficit present.      Mental Status: She is alert and oriented to person, place, and time.      Comments: GCS 15   Psychiatric:         Mood and Affect: Mood normal.         Behavior: Behavior normal.         Vitals:    04/15/20 1142   BP: (!) 177/94   Pulse: 79  Resp: 19   Temp: 98.7 F (37.1 C)   SpO2: 95%       Labs:     Recent Labs   Lab 04/15/20  0428   WBC 7.93   RBC 4.74   Hgb 13.9   Hematocrit 42.4   Platelets 216   Glucose 86   BUN 11.2   Creatinine 0.8   Calcium 9.0   Sodium 142   Potassium 4.2   Chloride 104   CO2 23       Rads:   Radiological Procedure reviewed.      CT LUMBAR SPINE WO CONTRAST  XR CHEST AP PORTABLE  CT THORACIC SPINE WO CONTRAST  XR LUMBAR SPINE AP AND LATERAL      The following images were received from an outside facility and reviewed:None    Attending Attestation:       Attending Addendum/Attestation:     I have personally seen and examined this patient and have participated in  their care. I agree with the clinical information, including the physical exam, patient history, and planning as documented by the Resident or Physician Assistant. In addition, I have edited this note to reflect my findings and plan as well as to incorporate any new data.    Patient is a 39 year old female who presents status post a fall last evening while playing with her dog.  Patient went to urgent care center where imaging showed an L1 compression fracture.  Patient was sent to [facility] for further evaluation.  Patient was complaining of severe mid back pain.  Neuromuscular exam intact.  Patient was evaluated by neurosurgery who recommended a TLSO brace.  Due to patient's severe pain, will admitt for pain control.  Will obtain physical therapy and Occupational Therapy evaluation.    Vernell Leep, MD  Acute Care Surgery Attending  [id number]

## 2020-04-15 NOTE — ED Notes (Signed)
PTS Dispatcher called informed transport ETA now 0700.

## 2020-04-15 NOTE — ED Provider Notes (Signed)
[facility] EMERGENCY DEPARTMENT  ATTENDING PHYSICIAN ATTESTATION NOTE     Patient Name: Victoria Woodward, Victoria Woodward  Encounter Date:  04/15/2020  Attending Physician: Lavone Nian, MD, MBA  Room:  S 5/S 5  Patient DOB:  1980/11/24  Age: 39 y.o. female  MRN:  [id number]  PCP: Oneita Hurt None, MD         Diagnosis/Disposition:     Final Impression  Final diagnoses:   Closed wedge compression fracture of L1 vertebra, initial encounter     Disposition  ED Disposition     ED Disposition Condition Date/Time Comment    Observation  Wed Apr 15, 2020 12:17 PM Admitting Physician: Vernell Leep [[id number]]   Service:: Trauma [127]   Estimated Length of Stay: < 2 midnights   Tentative Discharge Plan?: Home or Self Care [1]   Does patient need telemetry?: No          Follow up  Marlaine Hind, MD  [address]  [place] [address]  [phone]    Follow up  follow up in 4-6 weeks with lumbar spine x-rays    Prescriptions  New Prescriptions    No medications on file           MDM:      Attending Attestation:   The patient was seen and examined by the mid-level/resident and the plan of care was discussed with me. I agree with the plan as it was presented to me and was present during the key portions of any procedures performed.  I have reviewed and agree with the final ED diagnosis. Please see the separately documented midlevel/resident note for additional information including but not limited to full history of present illness, review of systems and comprehensive physical exam.   39 y/o F w/PMH per chart had a mechanical fall backwards and presented w/LBP to our freestanding ED where she was found to have a compression fracture.    Exam: CN2-12 intact, NV intact throughout, CTAB, abd soft/ttp   Plan: pain control, neurosurgery eval   The patient was ultimately admitted for pain control by trauma.          The patient's past medical records, including those in Care Everywhere when necessary, were reviewed by me.    This  patient was seen and evaluated during the SARS-CoV-2 pandemic.The following personal protective equipment was used by me in the care of this patient: disposable surgical mask.         Diagnostic Results:     Laboratory Studies:    All lab values have been personally reviewed by me    Results     Procedure Component Value Units Date/Time    Comprehensive metabolic panel [[id number]] Collected: 04/15/20 0428    Specimen: Blood Updated: 04/15/20 0456     Glucose 86 mg/dL      BUN 29.5 mg/dL      Creatinine 0.8 mg/dL      Sodium 284 mEq/L      Potassium 4.2 mEq/L      Chloride 104 mEq/L      CO2 23 mEq/L      Calcium 9.0 mg/dL      Protein, Total 7.3 g/dL      Albumin 4.0 g/dL      AST (SGOT) 20 U/L      ALT 20 U/L      Alkaline Phosphatase 98 U/L      Bilirubin, Total 0.3 mg/dL      Globulin 3.3  g/dL      Albumin/Globulin Ratio 1.2     Anion Gap 15.0    GFR [[id number]] Collected: 04/15/20 0428     Updated: 04/15/20 0456     EGFR >60.0    CBC and differential [[id number]] Collected: 04/15/20 0428    Specimen: Blood Updated: 04/15/20 0432     WBC 7.93 x10 3/uL      Hgb 13.9 g/dL      Hematocrit 78.2 %      Platelets 216 x10 3/uL      RBC 4.74 x10 6/uL      MCV 89.5 fL      MCH 29.3 pg      MCHC 32.8 g/dL      RDW 14 %      MPV 10.9 fL      Neutrophils 62.4 %      Lymphocytes Automated 28.1 %      Monocytes 7.6 %      Eosinophils Automated 1.1 %      Basophils Automated 0.3 %      Immature Granulocytes 0.5 %      Nucleated RBC 0.0 /100 WBC      Neutrophils Absolute 4.95 x10 3/uL      Lymphocytes Absolute Automated 2.23 x10 3/uL      Monocytes Absolute Automated 0.60 x10 3/uL      Eosinophils Absolute Automated 0.09 x10 3/uL      Basophils Absolute Automated 0.02 x10 3/uL      Immature Granulocytes Absolute 0.04 x10 3/uL      Absolute NRBC 0.00 x10 3/uL           Radiology Studies:    All images have been personally viewed by me    XR Chest AP Portable   Final Result         No acute cardioparenchymal process.      Charlott Rakes, MD    04/15/2020 1:12 PM      XR Lumbar Spine AP And Lateral   Final Result      No significant interval change.      [name], MD    04/15/2020 1:01 PM      CT L- Spine without Contrast   Final Result       Acute L1 vertebral body compression fracture. Mild loss of height. No   retropulsion.         COMMUNICATION: These urgent results were discussed with Dr. Arlana Lindau on 04/15/2020 4:01 AM.      Leontine Locket    04/15/2020 4:09 AM      CT Thoracic Spine WO Contrast    (Results Pending)           Interpretations, Clinical Decision Tools and Critical Care:              Procedures:   Procedures        Orders Placed During This Visit:     Encounter Orders:  Orders Placed This Encounter   Procedures    Apply brace    CT L- Spine without Contrast    XR Chest AP Portable    CT Thoracic Spine WO Contrast    XR Lumbar Spine AP And Lateral    CBC and differential    Comprehensive metabolic panel    GFR    Diet regular    Full Code    Urine HCG, POC/ Qualitative    Place (admit) for Observation Services  Encounter Medications:  Medications   lidocaine (LIDODERM) 5 % 1 patch (1 patch Transdermal Patch Applied 04/15/20 0328)   clonazePAM (KlonoPIN) tablet 1 mg (has no administration in time range)   gabapentin (NEURONTIN) capsule 300 mg (has no administration in time range)   venlafaxine (EFFEXOR) tablet 37.5 mg (has no administration in time range)   acetaminophen (TYLENOL) tablet 650 mg (has no administration in time range)   oxyCODONE (ROXICODONE) immediate release tablet 5 mg (has no administration in time range)     Or   oxyCODONE (ROXICODONE) immediate release tablet 10 mg (has no administration in time range)   cyclobenzaprine (FLEXERIL) tablet 5 mg (has no administration in time range)   enoxaparin (LOVENOX) syringe 60 mg (has no administration in time range)   ketorolac (TORADOL) injection 30 mg (30 mg Intramuscular Given 04/15/20 0321)   diazePAM (VALIUM) tablet 10 mg (10 mg Oral Given  04/15/20 0322)   fentaNYL (PF) (SUBLIMAZE) injection 100 mcg (100 mcg Intravenous Given 04/15/20 0429)   fentaNYL (PF) (SUBLIMAZE) injection 100 mcg (100 mcg Intravenous Given 04/15/20 0624)   oxyCODONE-acetaminophen (PERCOCET) 5-325 MG per tablet 1 tablet (1 tablet Oral Given 04/15/20 0808)   gabapentin (NEURONTIN) capsule 300 mg (300 mg Oral Given 04/15/20 0930)   cyclobenzaprine (FLEXERIL) tablet 10 mg (10 mg Oral Given 04/15/20 1009)   morphine injection 4 mg (4 mg Intravenous Given 04/15/20 1037)   diphenhydrAMINE (BENADRYL) capsule 25 mg (25 mg Oral Given 04/15/20 1037)   acetaminophen (TYLENOL) tablet 650 mg (650 mg Oral Given 04/15/20 1143)   oxyCODONE (ROXICODONE) immediate release tablet 10 mg (10 mg Oral Given 04/15/20 1143)   ibuprofen (ADVIL) tablet 400 mg (400 mg Oral Given 04/15/20 1143)           Allergies & Medications:     Allergies:  Sheis allergic to morphine.    Home Medications     Med List Status: Pharmacy Completed Set By: Andrez Grime at 04/15/2020  1:39 PM        Status Comment        04/15/2020  1:39 PM    Pharmacy med rec tech verified all medications and last doses with pt at bedside                ALPRAZolam (XANAX) 0.5 MG tablet     Take 0.5 mg by mouth nightly as needed     clonazePAM (KlonoPIN) 1 MG tablet     Take 1 mg by mouth daily        escitalopram (LEXAPRO) 10 MG tablet     Take 10 mg by mouth daily     gabapentin (NEURONTIN) 100 MG capsule     Take 100-200 mg by mouth nightly        ondansetron (ZOFRAN) 4 MG tablet     Take 4 mg by mouth every 8 (eight) hours as needed for Nausea                         Past History:     Medical:   Past Medical History:   Diagnosis Date    Anxiety     Depression        Surgical: She has no past surgical history on file.    Family: History reviewed. No pertinent family history.    Social: She has no history on file for tobacco use, alcohol use, and drug use.        ATTESTATIONS  Lavone Nian, MD, MBA    Scribe Attestation:    There was no scribe involved  in the care of this patient.     Documentation Notes:    Parts of this note were generated by the Epic EMR system/ Dragon speech recognition and may contain inherent errors or omissions not intended by the user. Grammatical errors, random word insertions, deletions, pronoun errors and incomplete sentences are occasional consequences of this technology due to software limitations. Not all errors are caught or corrected.    My documentation is often completed after the patient is no longer under my clinical care. In some cases, the Epic EMR may pull updated results into the above documentation which may not reflect all results or information that was available to me at the time of my medical decision making.     If there are questions or concerns about the content of this note or information contained within the body of this dictation they should be addressed directly with the author for clarification.                  Michel Santee, MD  04/15/20 1419

## 2020-04-15 NOTE — Pharmacy Admission Med History Basic (Signed)
Admission Medication History - BASIC    Patient's Pharmacy Information:    Primary Pharmacy Updated in Epic: Yes  Preferred pharmacy:   [id number] - [place], [place] - [address]  [address] [address] [place] [address]  Phone: [phone] Fax: [phone]      Notes:     Primary Source of Medication History: Pharmacy and Self      Prior to Admission Medication List:  Home Medications       Med List Status: Pharmacy Completed Set By: Andrez Grime at 04/15/2020  1:39 PM        Status Comment        04/15/2020  1:39 PM    Pharmacy med rec tech verified all medications and last doses with pt at bedside                  ALPRAZolam (XANAX) 0.5 MG tablet     Take 0.5 mg by mouth nightly as needed     clonazePAM (KlonoPIN) 1 MG tablet     Take 1 mg by mouth daily        escitalopram (LEXAPRO) 10 MG tablet     Take 10 mg by mouth daily     gabapentin (NEURONTIN) 100 MG capsule     Take 100-200 mg by mouth nightly        ondansetron (ZOFRAN) 4 MG tablet     Take 4 mg by mouth every 8 (eight) hours as needed for Nausea                   Summary:    There are no significant changes to the medication list.    Patient demonstrates Good compliance with medications.  Patient demonstrates Good knowledge of medications.     Additional Comments:     Andrez Grime

## 2020-04-15 NOTE — Progress Notes (Signed)
Neuro: AOx4. No Dizziness. No Headache. Neuro checks q4.  Cardiac: No tele. Denies chest pain.   Resp: Rm air 99%  Pain:  10/10 pain to lower back, gabapentin given. 7/10 pain after pain meds.   Skin:  Tattoos scattered. Intact.   GI:  Passing flatus. Last BM 6/1 per pt  GU: No void this shift.   IVs/Drains:  20G PIV R FA, saline locked, CDI  Mobility:  OOB with brace. Assist x2. Plan for PT/OT eval tomorrow.   Safety:  Call bell and table within reach. Bed alarm on. Bed wheels locked and in lowest position. Fall mat in place. 3/4 bed rails up. Pt knows to call for help.

## 2020-04-15 NOTE — ED Notes (Signed)
Orthopedic Services called in regards to TLSO brace. Brace to be delivered to patient's room in the Emergency Department.

## 2020-04-16 MED ORDER — HYDROMORPHONE HCL 0.5 MG/0.5 ML IJ SOLN
0.20 mg | Freq: Once | INTRAMUSCULAR | Status: AC
Start: 2020-04-16 — End: 2020-04-16
  Administered 2020-04-16: 08:00:00 0.2 mg via INTRAVENOUS
  Filled 2020-04-16: qty 1

## 2020-04-16 MED ORDER — GABAPENTIN 100 MG PO CAPS
400.00 mg | ORAL_CAPSULE | Freq: Three times a day (TID) | ORAL | Status: DC
Start: 2020-04-16 — End: 2020-04-17
  Administered 2020-04-16 – 2020-04-17 (×4): 400 mg via ORAL
  Filled 2020-04-16 (×4): qty 1

## 2020-04-16 MED ORDER — KETOROLAC TROMETHAMINE 30 MG/ML IJ SOLN
15.00 mg | Freq: Four times a day (QID) | INTRAMUSCULAR | Status: DC
Start: 2020-04-16 — End: 2020-04-17
  Administered 2020-04-16 – 2020-04-17 (×4): 15 mg via INTRAVENOUS
  Filled 2020-04-16 (×4): qty 1

## 2020-04-16 MED ORDER — HYDROMORPHONE HCL 0.5 MG/0.5 ML IJ SOLN
0.40 mg | INTRAMUSCULAR | Status: DC | PRN
Start: 2020-04-16 — End: 2020-04-16
  Administered 2020-04-16 (×2): 0.4 mg via INTRAVENOUS
  Filled 2020-04-16 (×2): qty 1

## 2020-04-16 MED ORDER — OXYCODONE HCL 5 MG PO TABS
5.0000 mg | ORAL_TABLET | Freq: Four times a day (QID) | ORAL | Status: DC | PRN
Start: 2020-04-16 — End: 2020-04-17

## 2020-04-16 MED ORDER — IBUPROFEN 600 MG PO TABS
600.0000 mg | ORAL_TABLET | Freq: Four times a day (QID) | ORAL | Status: DC
Start: 2020-04-16 — End: 2020-04-16
  Administered 2020-04-16: 08:00:00 600 mg via ORAL
  Filled 2020-04-16: qty 1

## 2020-04-16 MED ORDER — GABAPENTIN 100 MG PO CAPS
400.00 mg | ORAL_CAPSULE | Freq: Three times a day (TID) | ORAL | Status: DC
Start: 2020-04-16 — End: 2020-04-16

## 2020-04-16 MED ORDER — CYCLOBENZAPRINE HCL 10 MG PO TABS
10.0000 mg | ORAL_TABLET | Freq: Three times a day (TID) | ORAL | Status: DC | PRN
Start: 2020-04-16 — End: 2020-04-17
  Administered 2020-04-16 – 2020-04-17 (×4): 10 mg via ORAL
  Filled 2020-04-16 (×4): qty 1

## 2020-04-16 MED ORDER — HYDROMORPHONE HCL 0.5 MG/0.5 ML IJ SOLN
0.20 mg | INTRAMUSCULAR | Status: DC | PRN
Start: 2020-04-16 — End: 2020-04-17
  Administered 2020-04-16 – 2020-04-17 (×5): 0.2 mg via INTRAVENOUS
  Filled 2020-04-16 (×5): qty 1

## 2020-04-16 MED ORDER — GABAPENTIN 100 MG PO CAPS
100.00 mg | ORAL_CAPSULE | Freq: Once | ORAL | Status: AC
Start: 2020-04-16 — End: 2020-04-16
  Administered 2020-04-16: 08:00:00 100 mg via ORAL
  Filled 2020-04-16: qty 1

## 2020-04-16 MED ORDER — OXYCODONE HCL 5 MG PO TABS
10.0000 mg | ORAL_TABLET | Freq: Four times a day (QID) | ORAL | Status: DC | PRN
Start: 2020-04-16 — End: 2020-04-17
  Administered 2020-04-16 – 2020-04-17 (×5): 10 mg via ORAL
  Filled 2020-04-16 (×5): qty 2

## 2020-04-16 NOTE — PT Eval Note (Signed)
[facility]   Physical Therapy Evaluation     Patient: Victoria Woodward    MRN#: [id number]   Unit: [facility] CCW GROUND UNIT  Bed: FG40/FG40.01      Post Acute Care Therapy Recommendations:   Discharge Recommendations: Home with supervision     Milestones to be reached to achieve recommendation: stairs  Anticipate achievement in 1 sessions    DME Recommended for Discharge: FWW    Therapy discharge recommendations may change with patient status.  Please refer to most recent note for up-to-date recommendations.    Assessment:   Significant Findings: none    Victoria Woodward is a 39 y.o. female admitted 04/15/2020 with L1 vertebral fx and T9-T11 vertebral body fractures. Patient received on BSC, reports pain improved from this morning. Patient requiring sba for ambulation around unit using FWW and minA HHA. Patient walks at very slow pace but steady overall with some UE reliance through [facility]. Able to doff brace at edge of bed with cues. Stands with cgA and increased time due to pain. Patient requiring cues for log roll with sit/sup transition. Patient at baseline is fully ind with ADLs and transfers. Patient would benefit from 1-2 f/u sessions for higher level gait, spinal precautions,stair trial and continued mobility training.         Therapy Diagnosis: decreased ind with mobility    Rehabilitation Potential: good    Treatment Activities: PT evaluation, gait training spinal precautions, bed mob, brace application  Educated the patient to role of physical therapy, plan of care, goals of therapy and safety with mobility and ADLs, spine precautions, home safety.    Plan:   PT Frequency: 4-5x/wk    Treatment/Interventions: bed mob, spine precautions, stairs, gait, transfers, brace application    Risks/benefits/POC discussed with patient       Precautions and Contraindications:   Falls  Spine precautions  TLSO for OOB (ok to don edge of bed)    Consult received for Wendi Maya for PT  Evaluation and Treatment.  Patients medical condition is appropriate for Physical Therapy intervention at this time.    History of Present Illness:   Victoria Woodward is a 39 y.o. female admitted on 04/15/2020 with multiple fx's s/p fall while playing with dog    Medical Diagnosis: Closed wedge compression fracture of L1 vertebra, initial encounter [S32.010A]    Past Medical/Surgical History:  Past Medical History:   Diagnosis Date    Anxiety     Depression      History reviewed. No pertinent surgical history.    Imaging/Tests/Labs:  XR Lumbar Spine AP And Lateral  Result Date: 04/15/2020  No significant interval change. [name], MD  04/15/2020 1:01 PM    CT Thoracic Spine WO Contrast  Result Date: 04/15/2020  1. Suspected tiny fractures involving the T9, T10, and T11 vertebral bodies without height loss. No canal narrowing. 2. No other fracture in the thoracic spine. 3. Critical results given via telephone to Vanessa Kick PA  on 04/15/2020 at 2:19 PM [name], MD  04/15/2020 2:26 PM    CT L- Spine without Contrast  Result Date: 04/15/2020   Acute L1 vertebral body compression fracture. Mild loss of height. No retropulsion. COMMUNICATION: These urgent results were discussed with Dr. Arlana Lindau on 04/15/2020 4:01 AM. [name]  04/15/2020 4:09 AM    XR Chest AP Portable  Result Date: 04/15/2020  No acute cardioparenchymal process. Charlott Rakes, MD  04/15/2020 1:12 PM  Social History:   Prior Level of Function: fully ind ADLs and mobility  Assistive Devices: none  Baseline Activity: ind  DME Currently at Home: none  Home Living Arrangements: lives with daughter  Type of Home: apt  Home Layout: 1 flight of stairs    Subjective:   Patient is agreeable to participation in the therapy session. Nursing clears patient for therapy.     Patient Goal: none stated    Pain:   Scale: 5/10  Location: back  Intervention: was given pain meds, using brace    Objective:   Patient is seated on BSC with no medical  equipment in place.  Pt wore mask during therapy session:Yes in  hallway    Cognitive Status and Neuro Exam:  Alert and oriented    Musculoskeletal Examination  RUE ROM: wfl  LUE ROM: wfl  RLE ROM: wfl  LLE ROM: wfl    RUE Strength: wfl  LUE Strength: wfl  RLE Strength: wfl  LLE Strength: wfl    Functional Mobility  Rolling: cg/cues  Sit to supine: sba/cues to maintain spine precautions   Scooting: sba  Sit to Stand: cgA, increased time  Stand to Sit: sba  Transfers: sba with FWW        Ambulation  PMP - Progressive Mobility Protocol   PMP Activity: Step 7 - Walks out of Room  Distance Walked (ft) (Step 6,7): 125 Feet   Level of assistance required: sba with FWW; minA HHA  Pattern: slow pace, leans upper body through FWW  Device Used: FWW or  none  Weightbearing Status: FWB BLEs  Stair Management: NT; patient defer  Number of Stairs: NA  Door Management: NT  Wheelchair Management: NT      Balance  Static Sitting: ind  Dynamic Sitting: sup  Static Standing: sup   Dynamic Standing: sba    Participation and Activity Tolerance  Participation Effort: good  Endurance: good-              Patient left with call bell within reach, all needs met, SCDs off, fall mat in place, bed alarm on, chair alarm off and all questions answered. RN notified of session outcome and patient response.     Goals:  Goals  Goal Formulation: With patient  Time for Goal Acheivement: 2 visits  Pt Will Go Supine To Sit: modified independent  Pt Will Transfer Bed/Chair: independent(or with LRD)  Pt Will Ambulate: > 200 feet, with rolling walker, modified independent(or no device)  Pt Will Go Up / Down Stairs: 1 flight, with supervision  Other Goal: (don/doff TLSO brace independently )      PPE worn during session: procedural mask and gloves    Tech present: no  PPE worn by tech: N/A      Theodora Blow, PT  [phone]    Time of Treatment  PT Received On: 04/16/20  Start Time: 1155  Stop Time: 1220  Time Calculation (min): 25 min

## 2020-04-16 NOTE — Progress Notes (Signed)
Pt on bedside commode.  In a lot of pain and unable to urinate.  Will call resident

## 2020-04-16 NOTE — Progress Notes (Addendum)
Patient's 7AM BP 150/102. Reporting 10/10 pain. Given Advil, Gabapentin, Flexeril, and Dilaudid. Resident called, no new orders. Will recheck BP in 30 min.     Update: Repeat BP decreasing. Continuing to deny chest pain/SOB. Patient reporting more control over pain. Will continue to monitor.     Update: BP 142/89. Patient continues to be asymptomatic.

## 2020-04-16 NOTE — UM Notes (Signed)
04/15/20 1217  Place (admit) for Observation Services Once     04/15/20 1218     Initial OBS review  Unit: Medicine/ short stay    39 year old female presents after a fall while playing with dog, went to sleep but bot able to move secondary to pain, went to Urgent care and imaging showed L spine fracture and was transferred to [facility]. Patient complaining of severe mid back pain.   Admitted to OBS for pain control, PT/OT    ED Course:  VS: 97.9, 100, 20, 180/121, 96 % RA  Labs:  CXR: negative  CT L spine wo contrast: Acute L1 vertebral body compression fracture. Mild loss of height. No   retropulsion.   Meds: Toradol 30 mg IV X 1, Fentanyl 100 mcg IV X 2, Morphine 4 mg IV X 1    Plan:  TLSO brace   Neurosurgery following - plan to treat with external stabilization, TLSO ordered, to be worn when OOB, upright xrays.  T spine imaging pending  IS/ OOB to chair  Regular diet  Monitor Intake and output  PT/OT  Continue home meds   Pain control - scheduled tylenol,  Lidocaine patch, prn oxycodone    CT Thoracic spine wo contrast: 1. Suspected tiny fractures involving the T9, T10, and T11 vertebral bodies without height loss. No canal narrowing. 2. No other fracture in the thoracic spine.    April 16, 2020    Small T9-T11 fractures per CT T spine.    BP 152/88    Pulse 71    Temp 97.9 F (36.6 C) (Oral)    Resp 16    Ht 1.6 m (5' 2.99")    Wt 112 kg (246 lb 14.6 oz)    SpO2 100%    BMI 43.75 kg/m     Current Facility-Administered Medications   Medication Dose Route Frequency    acetaminophen  650 mg Oral 4 times per day    enoxaparin  0.5 mg/kg Subcutaneous Q12H    gabapentin  400 mg Oral Q8H SCH    ibuprofen  600 mg Oral 4 times per day    lidocaine  1 patch Transdermal Q24H    senna-docusate  1 tablet Oral QHS    venlafaxine  37.5 mg Oral Q12H       Dispo: Pending PT/OT eval, anticipate d/c today with pain control    Pollyann Kennedy, RN, BSN   UR Case Manager   [phone] voice mail only  Case Management Department    [facility]   [address]   [place], [place] [address]  [email or web address]    For Immediate assistance:  Case Management Office:  Ph: [phone]  Fax: [phone]  Please use fax number to provide authorization for hospital services or to request additional information

## 2020-04-16 NOTE — OT Eval Note (Signed)
[facility]   Occupational Therapy Evaluation     Patient: Victoria Woodward    MRN#: [id number]   Unit: [facility] CCW GROUND UNIT  Bed: FG40/FG40.01                                     Post Acute Care Therapy Recommendations:   Discharge Recommendations: Home with supervision     Milestones to be reached to achieve recommendation: Toilet transfer, LB dressing  Anticipate achievement in 1 sessions    DME Recommended for Discharge: Reacher, Long-handled sponge, BSC, Shower chair      Therapy discharge recommendations may change with patient status.  Please refer to most recent note for up-to-date recommendations.    Assessment:   Significant Findings: Significant pain in lumbar region, RN aware.    Victoria Woodward is a 39 y.o. female admitted 04/15/2020.   Pt reports living with children in apartment with one flight of stairs to enter, being I with ADLs and I with functional mobility. Today, pt presents with significant pain, requiring increased time to complete all functional activities. Pt donned TLSO EOB with mod A. Pt trained in log roll technique with min verbal cues, spinal precautions,  PLB, and compensatory techniques for dressing, as well as DME recs to adhere to spinal precautions. Pt completed functional t/f EOB>chair with RW for sit to stand and SBA for mobility (with no AE). Patient will benefit from 1 additional inpatient OT services to address toilet t/f and LB dressing to increase independence with ADL/IADL activities.  Impairments: Assessment: decreased strength;decreased independence with ADLs;decreased independence with IADLs;decreased endurance/activity tolerance    Therapy Diagnosis: Decreased I with ADLs    Rehabilitation Potential: Prognosis: Good;With family     Treatment Activities: Self Care, pt education  Educated the patient to role of occupational therapy, plan of care, goals of therapy and safety with mobility and ADLs, energy conservation techniques, pursed  lip breathing, spine precautions, discharge instructions, home safety.    Plan:   OT Frequency Recommended: follow-up visit only     Treatment Interventions: ADL retraining;Functional transfer training;Endurance training;Equipment eval/education     Risks/benefits/POC discussed with patient         Precautions and Contraindications:   Precautions  Weight Bearing Status: no restrictions  Precaution Instructions Given to Patient: Yes  Back Brace Applied: OOB  Spinal Precautions: no bending, no twisting, no lifting      Consult received for Wendi Maya for OT Evaluation and Treatment.  Patients medical condition is appropriate for Occupational Therapy intervention at this time.    Admitting Diagnosis: Closed wedge compression fracture of L1 vertebra, initial encounter [S32.010A]      History of Present Illness:    Victoria Woodward is a 39 y.o. female admitted on 04/15/2020 with s/p close wedge compression fracture of L1 vertebra 2/2 fall      Past Medical/Surgical History:  Past Medical History:   Diagnosis Date    Anxiety     Depression        History reviewed. No pertinent surgical history.   Imaging/Tests/Labs:  XR Lumbar Spine AP And Lateral    Result Date: 04/15/2020  No significant interval change. [name], MD  04/15/2020 1:01 PM    CT Thoracic Spine WO Contrast    Result Date: 04/15/2020  1. Suspected tiny fractures involving the T9, T10, and T11 vertebral bodies without  height loss. No canal narrowing. 2. No other fracture in the thoracic spine. 3. Critical results given via telephone to Vanessa Kick PA  on 04/15/2020 at 2:19 PM [name], MD  04/15/2020 2:26 PM    CT L- Spine without Contrast    Result Date: 04/15/2020   Acute L1 vertebral body compression fracture. Mild loss of height. No retropulsion. COMMUNICATION: These urgent results were discussed with Dr. Arlana Lindau on 04/15/2020 4:01 AM. [name]  04/15/2020 4:09 AM    XR Chest AP Portable    Result Date: 04/15/2020  No acute  cardioparenchymal process. Charlott Rakes, MD  04/15/2020 1:12 PM       Social History:   Prior Level of Function:  Prior level of function: Independent with ADLs, Ambulates independently  Baseline Activity Level: Community ambulation  Driving: independent  Dressing - Upper Body: independent  Dressing - Lower Body: independent  Cooking: Yes  Feeding: independent  Bathing: independent  Grooming: independent  Employment: FT    Home Living Arrangements:  Living Arrangements: Children  Type of Home: Apartment  Home Layout: One level, Stairs to enter with rails (add number in comment)(10)  Bathroom Shower/Tub: Pension scheme manager: Standard      Subjective:"This hurts a lot!"     Patient is agreeable to participation in the therapy session.            Pain Assessment  Pain Assessment: Wong-Baker FACES  Wong-Baker FACES Pain Rating: Hurts even more  Pain Location: Back  Pain Orientation: Lower  Pain Descriptors: Aching;Throbbing  Pain Frequency: Increases with movement  Effect of Pain on Daily Activities: severe  Pain Intervention(s): Heat applied;Repositioned;Medication (See eMAR)      Objective:        Observation of Patient/Vital Signs:  Patient is in bed with SCD's in place.  Pt wore mask during therapy session:No      Cognitive Status and Neuro Exam:  Cognition/Neuro Status  Arousal/Alertness: Appropriate responses to stimuli  Attention Span: Appears intact  Orientation Level: Oriented X4  Memory: Appears intact  Following Commands: independent  Safety Awareness: independent  Insights: Fully aware of deficits  Problem Solving: Able to problem solve independently  Behavior: cooperative;flat affect  Motor Planning: decreased processing speed    Neuro Status  Behavior: cooperative;flat affect  Motor Planning: decreased processing speed         Musculoskeletal Examination  Gross ROM  Right Upper Extremity ROM: within functional limits  Left Upper Extremity ROM: within functional limits    Gross Strength  Right  Upper Extremity Strength: 4+/5  Left Upper Extremity Strength: 4+/5              Sensory/Oculomotor Examination  Sensory  Auditory: intact  Tactile - Light Touch: intact  Visual Acuity: intact    Vision - Complex Assessment  Acuity: Able to read clock/calendar on wall without difficulty;Able to read employee name badge without difficulty    Activities of Daily Living  Self-care and Home Management  Eating: Setup;in bed  Grooming: setup;in chair  Bathing: Moderate Assist;sitting in shower  UB Dressing: Minimal Assist  LB Dressing: Moderate Assist  Toileting: Stand by Assist  Functional Transfers: Stand by Assist    Functional Mobility:  Mobility and Transfers  Rolling: Stand by Assist  Scooting to EOB: Increased Time;Increased Effort;Stand by Assist  Supine to Sit: Stand by Assist;Increased Time;Increased Effort  Sit to Stand: Increased Time;Increased Effort;Stand by Assist  Bed to Chair: Stand by Assist  Functional  Mobility/Ambulation: Stand by Assist     PMP Activity: Step 6 - Walks in Room     Balance  Balance  Static Sitting Balance: good  Dyanamic Sitting Balance: fair  Static Standing Balance: fair  Dynamic Standing Balance: poor    Participation and Activity Tolerance  Participation and Endurance  Participation Effort: good  Endurance: Tolerates 10 - 20 min exercise with multiple rests    Patient left with call bell within reach, all needs met, SCDs on as found, fall mat in place, bed alarm off, chair alarm on and all questions answered. RN notified of session outcome and patient response.       Goals:  Time For Goal Achievement: 1 visit  ADL Goals  Patient will dress lower body: Supervision, with AE  Mobility and Transfer Goals  Pt will transfer bed to toilet: Supervision                           PPE worn during session: procedural mask, goggles and gloves    Tech present: No  PPE worn by tech: N/A    Ihor Austin, OTR/L  Pager [phone]      Time of treatment:   OT Received On: 04/16/20  Start Time:  0900  Stop Time: 0935  Time Calculation (min): 35 min

## 2020-04-16 NOTE — Progress Notes (Signed)
Situation:  Surgery  Compression fracture of L1 vertebra    Background:  39 year old female with compression fracture of L1 vertebra. Pt lives with daughter in a private residence. Pt is self-sufficient at baseline.    Assessment  Alert and oriented  Pt is insured and has Rx coverage  Daughter is emergency contact  No barriers noted at this present time     04/16/20 1201   Healthcare Decisions   Interviewed: Patient   Orientation/Decision Making Abilities of Patient Alert and Oriented x3, able to make decisions   Advance Directive Patient does not have advance directive   Healthcare Agent Appointed No   (RETIRED) Healthcare Agent's Name NA   (RETIRED) Healthcare Agent's Phone Number NA   Additional Emergency Contacts? BOBBE, QUILTER [phone] Daughter   Prior to admission   Prior level of function Independent with ADLs;Ambulates independently   Type of Residence Private residence   Have running water, electricity, heat, etc? Yes   Living Arrangements Children   How do you get to your MD appointments? SELF   How do you get your groceries? SELF   Who fixes your meals? SELF   Who does your laundry? SELF   Who picks up your prescriptions? SELF   Dressing Independent   Grooming Independent   Feeding Independent   Bathing Independent   Toileting Independent   Name of Prior Assisted Living Facility NA   Prior SNF admission? (Detail) NA   Prior Rehab admission? (Detail) NA   Discharge Planning   Support Systems Children   Patient expects to be discharged to: HOME   Mode of transportation: Private car (friend)   Does the patient have perscription coverage? Yes   Consults/Providers   PT Evaluation Needed 2   OT Evalulation Needed 2   SLP Evaluation Needed 2   Outcome Palliative Care Screen Screened but did not meet criteria for intervention   Correct PCP listed in Epic? Yes   Family and PCP   PCP on file was verified as the current PCP? Yes   Name of family member to be notified Victoria Woodward, Victoria Woodward [phone] Daughter   In case  you are admitted, would like your PCP notified? Yes   Important Message from [facility] Notice   Patient received 1st IMM Letter? No     Recommendation  Home pending PT/OT eval  CM will follow for d/c planning needs    Victoria Woodward, MSW  SW Case Manager  Case Manager Department  [phone]

## 2020-04-16 NOTE — Progress Notes (Signed)
TRAUMA TERTIARY SURVEY FORM AND INITIAL PROGRESS NOTE       Interval History:   Victoria Woodward is a 39 y.o. female who was admitted 04/15/2020  2:42 AM to [facility] by Vernell Leep, MD after fall while playing with dog while playing with her dog, sustaining L1 fracture and small T9-11 fractures.    Substance usage:   social drinker  The patient denies current or previous tobacco use.  denied.    SBRT (Screening, Brief Intervention, and Referral to Treatment) performed: Yes  CATS (Community addiction team) requested: No, not indicated    Allergies:     Allergies   Allergen Reactions    Morphine Itching       Medical history:     Past Medical History:   Diagnosis Date    Anxiety     Depression        Medications:     Home Medications:   Prior to Admission medications    Medication Sig Start Date End Date Taking? Authorizing Provider   ALPRAZolam (XANAX) 0.5 MG tablet Take 0.5 mg by mouth nightly as needed   Yes [provider]   clonazePAM (KlonoPIN) 1 MG tablet Take 1 mg by mouth daily      Yes [provider]   escitalopram (LEXAPRO) 10 MG tablet Take 10 mg by mouth daily   Yes [provider]   gabapentin (NEURONTIN) 100 MG capsule Take 100-200 mg by mouth nightly      Yes [provider]   ondansetron (ZOFRAN) 4 MG tablet Take 4 mg by mouth every 8 (eight) hours as needed for Nausea   Yes [provider]     Scheduled Medications:   Current Facility-Administered Medications   Medication Dose Route Frequency    acetaminophen  650 mg Oral 4 times per day    enoxaparin  0.5 mg/kg Subcutaneous Q12H    gabapentin  300 mg Oral Q8H SCH    lidocaine  1 patch Transdermal Q24H    senna-docusate  1 tablet Oral QHS    venlafaxine  37.5 mg Oral Q12H     Infusion Medications:     PRN Medications:   clonazePAM, cyclobenzaprine, oxyCODONE **OR** oxyCODONE     Verify appropriate medications are reconciled: Yes    Review of systems since  admission:   Review of Systems   Constitutional: Negative for chills and fever.   Eyes: Negative for blurred vision.   Respiratory: Negative for cough and shortness of breath.    Cardiovascular: Negative for chest pain, palpitations and leg swelling.   Gastrointestinal: Negative for abdominal pain, blood in stool, constipation, diarrhea, melena, nausea and vomiting.   Genitourinary: Negative for dysuria.   Musculoskeletal: Positive for back pain.   Skin: Negative for rash.   Neurological: Negative for dizziness, focal weakness and headaches.      Available radiology data:   XR Lumbar Spine AP And Lateral    Result Date: 04/15/2020  No significant interval change. [name], MD  04/15/2020 1:01 PM    CT Thoracic Spine WO Contrast    Result Date: 04/15/2020  1. Suspected tiny fractures involving the T9, T10, and T11 vertebral bodies without height loss. No canal narrowing. 2. No other fracture in the thoracic spine. 3. Critical results given via telephone to Vanessa Kick PA  on 04/15/2020 at 2:19 PM [name], MD  04/15/2020 2:26 PM    XR Chest AP Portable    Result  Date: 04/15/2020  No acute cardioparenchymal process. Charlott Rakes, MD  04/15/2020 1:12 PM       Current laboratory data:     Recent Labs   Lab 04/15/20  0428   WBC 7.93   RBC 4.74   Hgb 13.9   Hematocrit 42.4   Platelets 216   Glucose 86   BUN 11.2   Creatinine 0.8   Calcium 9.0   Sodium 142   Potassium 4.2   Chloride 104   CO2 23     Physical examination:   Physical Exam  Vitals reviewed.   Constitutional:       General: She is not in acute distress.     Appearance: Normal appearance. She is obese. She is not toxic-appearing.   HENT:      Head: Normocephalic.      Right Ear: External ear normal.      Left Ear: External ear normal.      Mouth/Throat:      Mouth: Mucous membranes are moist.      Pharynx: Oropharynx is clear. No oropharyngeal exudate.   Eyes:      General: No scleral icterus.     Extraocular Movements: Extraocular movements intact.       Pupils: Pupils are equal, round, and reactive to light.   Cardiovascular:      Rate and Rhythm: Normal rate and regular rhythm.      Pulses: Normal pulses.      Heart sounds: Normal heart sounds.   Pulmonary:      Effort: Pulmonary effort is normal. No respiratory distress.      Breath sounds: Normal breath sounds. No wheezing.   Chest:      Chest wall: No tenderness.   Abdominal:      General: There is no distension.      Palpations: Abdomen is soft.      Tenderness: There is no abdominal tenderness. There is no guarding or rebound.   Musculoskeletal:      Right shoulder: No deformity or tenderness. Normal range of motion.      Left shoulder: No deformity or tenderness. Normal range of motion.      Right upper arm: No deformity or tenderness.      Left upper arm: No deformity or tenderness.      Right elbow: No deformity. Normal range of motion. No tenderness.      Left elbow: No deformity. Normal range of motion. No tenderness.      Right forearm: No deformity or tenderness.      Left forearm: No deformity or tenderness.      Right wrist: No deformity or tenderness. Normal range of motion.      Left wrist: No deformity or tenderness. Normal range of motion.      Right hand: No deformity or tenderness. Normal range of motion. Normal strength. Normal sensation.      Left hand: No deformity or tenderness. Normal range of motion. Normal strength. Normal sensation.      Cervical back: Normal range of motion. No deformity (no step off), rigidity or tenderness. No muscular tenderness.      Thoracic back: Bony tenderness present. No deformity (no step off).      Lumbar back: Tenderness (R paraspinal > L) and bony tenderness present. No deformity (no step off).      Right hip: No deformity or tenderness. Normal range of motion.      Left hip: No deformity or tenderness. Normal range  of motion.      Right upper leg: No deformity or tenderness.      Left upper leg: No deformity or tenderness.      Right knee: No deformity.  Normal range of motion. No tenderness.      Left knee: No deformity. Normal range of motion. No tenderness.      Right lower leg: No deformity. No edema.      Left lower leg: No deformity. No edema.      Right ankle: No deformity. No tenderness. Normal range of motion.      Left ankle: No deformity. No tenderness. Normal range of motion.      Right foot: Normal range of motion. No deformity or tenderness.      Left foot: Normal range of motion. No deformity or tenderness.      Comments: No chest wall TTP or crepitus   Skin:     General: Skin is warm.      Findings: No rash.   Neurological:      General: No focal deficit present.      Mental Status: She is alert and oriented to person, place, and time.      GCS: GCS eye subscore is 4. GCS verbal subscore is 5. GCS motor subscore is 6.      Cranial Nerves: Cranial nerves are intact.      Sensory: Sensation is intact.      Motor: Motor function is intact.   Psychiatric:         Mood and Affect: Mood normal.          Vital signs:  Temp:  [97.8 F (36.6 C)-98.7 F (37.1 C)] 98 F (36.7 C)  Heart Rate:  [73-83] 77  Resp Rate:  [16-22] 17  BP: (133-182)/(78-112) 153/93    List of injuries by system:   Neuro: L1 fracture and small T9-11 fractures    Problem list:     Active Hospital Problems    Diagnosis    Closed wedge compression fracture of L1 vertebra, initial encounter    Pain, acute due to trauma    Fall        Verify problem list is appropriately updated: Yes    Consulting services:   Neurosurgery    Confirm consulting services have been notified: Yes    Assessment and plan:   Neuro: Acute pain due to trauma, L1 fracture and small T9-11 fractures.  - TLSO when OOB, f/u with NSGY 6 wks w/ repeat spine xrays  - Sch tylenol, gaba, lido, + advil PRN flex, oxy  Pulm: SORA  - IS, Pulm toilet  CV: HDS, intermittent high blood pressure with pain yesterday  - Monitor vital signs  Endo:   - Monitor BG PRN  GI:  NAI  - Regular diet  Bowel regimen: pericolace  Heme/ID:  AF  - Monitor for s/s of infection  DVT px: SCDs, Lovenox BID  Renal:  - Monitor UOP  Foley: N  Neuromuscular: No other injuries at this time  - PT/OT   Psych:  - Supportive  - Continue home venlafaxine and PRN clonazepam  Wounds:   - LWC PRN    Dispo: Pending PT/OT eval, anticipate d/c today with pain control    Signed by Jeanelle Malling Carole Doner 04/16/20 5:48 AM    Trauma surgeon attestation:

## 2020-04-16 NOTE — Progress Notes (Addendum)
Neuro: A/Ox4   Respiratory: breathing unlabored, chest rise symmetrical, 2000cc noted to incentive spirometer   Cardiac: S1/S2 auscultated   Skin: intact, scattered tattoos   Foley/drains/lines: IV in place, clean/dry/intact, saline locked   Mobility: TLSO when out of bed    Fall Risk: moderate, fall mat in place, bed alarm on   Diet:regular     Shift Note:   -elevated BP attributed to severe pain, resident aware   -patient ambulates with +1 assist   -PT/OT consult this shift   -11AM: patient reporting urine retention, unable to void on bedside commode. Bladder scanner revealed 675cc. Order for straight cath placed, patient tolerated procedure well. Next due to void at 6:45PM.   -patient unable to void by 6:45PM time, bladder scan revealed >800cc. Per Resident, perform straight cath with Foley, if >500cc of urine drained out leave Foley in. Resident plans to place orders for this.     Plan: control pain over 1-2 days. Team will determine if patient is candidate for surgery.

## 2020-04-16 NOTE — Progress Notes (Signed)
A/o x4, vss afebrile. C/o 7-9 pain to back.  Spoke to resident, no adjustments made to meds.  Brace in room for when oob.  Safety measures in place.  Will monitor.

## 2020-04-16 NOTE — Progress Notes (Signed)
04/16/20 1525   CM Review   Acknowledgment of Outpatient/Observation Observation letter given

## 2020-04-16 NOTE — Discharge Instr - AVS First Page (Addendum)
[facility] - Neurosurgery   [address]  [place], [place] [address]  Phone: [phone] and Fax: [phone]        Discharge Instructions    FOLLOW-UP:  You will need a follow-up in 6 weeks.    IMAGING:  You will need a repeat x-rays in 6 weeks, 1-2 days prior to your appointment.  Call the neurosurgery office for the xray scan order.  Please Call [facility] [phone] or [facility] to schedule your imaging [phone].    ACTIVITY:  - Wear your TLSO when out of the bed. Do not stand without brace on.  - You may apply and remove at edge of bed.  - You may remove your brace in the shower while in a seated position. Do not stand without the brace. Avoid twisting or bending your back.  Increase activity slowly; do not rapidly increase activity just because you feel good in the morning  If you feel lightheaded or fatigued after increasing activity, rest and decrease the amount you are doing to build activity slowly  Do not drive until you are cleared by your physician and insurance company. You may not drive if you have had a seizure.  Do not lift items over 5 lbs   Avoid activity that causes you to hold your breath and strain; for example lifting, moving heavy objects, straining during bowel movements.     SMOKING:  Smoking delays the healing process; we request that you avoid smoking if possible     EATING AND DRINKING:  You may resume a normal diet following your procedure. Please avoid alcoholic beverages while taking pain medication.    WORK:  Do not return to work until you have been advised to do so by staff. Generally we recommend taking 4 weeks off of work for recovery, but each individual situation will be reviewed independently.    HOUSEWORK:  Avoid vacuuming or laundry until you have followed up in the clinic. The bending and lifting motion can place pressure on your back and neck    PROBLEMS or CONCERNS:  Please feel free to call the office at  any time for problems, concerns, or questions you may have. We are always available to help you or answer a question. In an emergency, please call 911 or go to the nearest Emergency Department.      TRAUMA ACUTE CARE SURGERY DISCHARGE INSTRUCTIONS    Your primary management team during your stay was the Trauma Acute Care Surgery [facility]) team.  Our office/mailing address is:    Trauma Acute Care Services  [address], [address]  [place], [place]  [address]  Phone: [phone]   FAX: [phone]    Follow Up Information:  Not all patients need to follow up with our trauma surgeons.  If you are recommended to do so, please call [phone] within 48 hours of your discharge from the hospital, acute rehab facility, or skilled nursing facility.  Our trauma clinic is held on Mondays.    Do you need to make an appointment to be seen in the trauma clinic?: No. Please call with questions or concerns.    Diet:  Regular diet    Activity: Activity as tolerated with TLSO brace worn while out of the bed, head of the bed >30, or sitting upright. Do not stand without wearing your brace. You may remove briefly while showering in a seating position, avoid bending/twisting. Reapply brace prior to standing.  Wound Care: None    Additional special instructions upon discharge:    - You may place your back brace on while sitting on the edge of the bed.    - Wear a t shirt underneath your back brace.   - Monitor skin under the brace for redness or skin irritation  - Do not sleep in your back brace   - No driving while in brace until medically cleared by Neurosurgery  - F/u with neurosurgery as directed in 6 weeks.  Have an X-ray done prior to that follow-up  - You do not need to follow-up with the trauma team.  Call with any questions or concerns.  - Follow-up with the transitional clinic or find a primary care physician for management of your overall health and to assist with physical therapy set up    Wounds/Surgical  Incisions:  You will be given specific instructions on how to care for your wound.  In general, dressings may be removed 48 hours after your operation or discharge and you may shower.  Do not soak your incisions.  If the wound should become red, warm or starts draining fluid, or you if you begin to have a fever or increased pain at the site, please contact our office.      Forms requiring a providers signature:  If you have insurance forms, Family and Medical Leave (FMLA) forms, or worker's compensation forms that need to be filled out by your provider, please mail or fax the forms to our office at the mailing address listed above.    Pain Medication:  DO NOT drive or operate machinery while taking pain medication  Avoid drinking alcohol while taking pain medicine    The trauma service can manage your general postoperative or post-trauma pain. If your pain is from injuries or an operation that was handled or performed by another surgery team or consultant, such as Orthopedics, Neurosurgery, or Spine surgery, we kindly request that you call that surgeon's office to address your pain needs. If you have follow-up with the trauma service, please call the trauma office.  Please plan accordingly as refills can take up to 48-72 hours.    It is important to take your medications on time and never take more than the prescribed amount  Take with food to avoid an upset stomach  Medications take time to work, up to 20-30 minutes to take effect    If the pain lessens, try taking your pain medication less often  Eliminate a dose of pain medication at a time of day when you experience less pain    Initiate use of Tylenol as your pain decreases, until you no longer need to take any medication for pain     Constipation is a common side effect of pain medications  You should be taking a stool softener (ex. Colace) as long as you are on any narcotic pain medication.    Eat lots of fruits and vegetables and keep hydrated   Over the  counter laxatives and enemas are available at your local pharmacy       For emotional support resources after your traumatic injury, please contact the [facility] Survivors Network Coordinator, [place], at [phone] or [email or web address] .[facility] Discharge Information     Your doctor has ordered Physical Therapy in-home service(s) for you while you recuperate at home, to assist you in the transition from hospital to home.      The agency that you or  your representative chose to provide the service:  Name of Home Health Agency Placement: [facility]]  [phone]    [employer]:   ]  Equipment Ordered: Rolling walker delivered to patient's hospital room.  BSC to be delivered to patient's home if approved by insurance.    The above services were set up by:  Jason Fila  [facility] Liaison)   Phone  [phone]    IF YOU HAVE NOT HEARD FROM YOUR HOME YOUR HOME HEALTH AGENCY WITHIN 24-48 HOURS AFTER DISCHARGE PLEASE CALL YOUR AGENCY TO ARRANGE A TIME FOR YOUR FIRST VISIT. FOR ANY SCHEDULING CONCERNS OR QUESTIONS RELATED TO HOME HEALTH, SUCH AS TIME OR DATE PLEASE CONTACT YOUR HOME HEALTH AGENCY AT THE NUMBER LISTED ABOVE.

## 2020-04-17 ENCOUNTER — Encounter (INDEPENDENT_AMBULATORY_CARE_PROVIDER_SITE_OTHER): Payer: Self-pay

## 2020-04-17 DIAGNOSIS — W010XXA Fall on same level from slipping, tripping and stumbling without subsequent striking against object, initial encounter: Secondary | ICD-10-CM

## 2020-04-17 DIAGNOSIS — E785 Hyperlipidemia, unspecified: Secondary | ICD-10-CM | POA: Insufficient documentation

## 2020-04-17 LAB — ANTI-XA, LMWH: Anti-Xa, LMWH: 0.37 U/mL

## 2020-04-17 MED ORDER — OXYCODONE HCL 5 MG PO TABS
5.0000 mg | ORAL_TABLET | Freq: Four times a day (QID) | ORAL | 0 refills | Status: DC | PRN
Start: 2020-04-17 — End: 2020-05-04

## 2020-04-17 MED ORDER — CYCLOBENZAPRINE HCL 10 MG PO TABS
5.0000 mg | ORAL_TABLET | Freq: Three times a day (TID) | ORAL | 0 refills | Status: DC | PRN
Start: 2020-04-17 — End: 2020-04-17

## 2020-04-17 MED ORDER — IBUPROFEN 800 MG PO TABS
800.0000 mg | ORAL_TABLET | Freq: Four times a day (QID) | ORAL | Status: DC | PRN
Start: 2020-04-17 — End: 2020-04-27

## 2020-04-17 MED ORDER — IBUPROFEN 400 MG PO TABS
800.0000 mg | ORAL_TABLET | Freq: Four times a day (QID) | ORAL | Status: DC
Start: 2020-04-17 — End: 2020-04-17
  Administered 2020-04-17 (×2): 800 mg via ORAL
  Filled 2020-04-17 (×2): qty 2

## 2020-04-17 MED ORDER — ACETAMINOPHEN 325 MG PO TABS
650.0000 mg | ORAL_TABLET | Freq: Four times a day (QID) | ORAL | Status: DC | PRN
Start: 2020-04-17 — End: 2020-07-06

## 2020-04-17 MED ORDER — SENNOSIDES-DOCUSATE SODIUM 8.6-50 MG PO TABS
1.0000 | ORAL_TABLET | Freq: Every evening | ORAL | Status: DC
Start: 2020-04-17 — End: 2020-05-04

## 2020-04-17 MED ORDER — CYCLOBENZAPRINE HCL 5 MG PO TABS
5.0000 mg | ORAL_TABLET | Freq: Three times a day (TID) | ORAL | 0 refills | Status: DC | PRN
Start: 2020-04-17 — End: 2020-05-04

## 2020-04-17 MED ORDER — OXYCODONE HCL 5 MG PO TABS
5.0000 mg | ORAL_TABLET | Freq: Four times a day (QID) | ORAL | 0 refills | Status: DC | PRN
Start: 2020-04-17 — End: 2020-04-17

## 2020-04-17 MED ORDER — GABAPENTIN 100 MG PO CAPS
200.00 mg | ORAL_CAPSULE | Freq: Three times a day (TID) | ORAL | 0 refills | Status: DC
Start: 2020-04-17 — End: 2020-05-08

## 2020-04-17 NOTE — Progress Notes (Addendum)
Home Health Referral          Referral from (Case Manager) for home health care upon discharge.    By [employer], the patient has the right to freely choose a home care provider.  Arrangements have been made with:     A company of the patients choosing. We have supplied the patient with a listing of providers in your area who asked to be included and participate in Medicare.   [facility], formerly [facility], a home care agency that provides adult home care services and participates in Medicare   The preferred provider of your insurance company. Choosing a home care provider other than your insurance company's preferred provider may affect your insurance coverage.      Home Health Discharge Information     Your doctor has ordered Physical Therapy in-home service(s) for you while you recuperate at home, to assist you in the transition from hospital to home.      The agency that you or your representative chose to provide the service:  Name of Home Health Agency Placement: [facility]]  [phone]    [employer]:   ]  Equipment Ordered: Rolling walker delivered to patient's hospital room.  BSC to be delivered to patient's home if approved by insurance.    The above services were set up by:  Jason Fila  [facility] Liaison)   Phone  [phone]    IF YOU HAVE NOT HEARD FROM YOUR HOME YOUR HOME HEALTH AGENCY WITHIN 24-48 HOURS AFTER DISCHARGE PLEASE CALL YOUR AGENCY TO ARRANGE A TIME FOR YOUR FIRST VISIT. FOR ANY SCHEDULING CONCERNS OR QUESTIONS RELATED TO HOME HEALTH, SUCH AS TIME OR DATE PLEASE CONTACT YOUR HOME HEALTH AGENCY AT THE NUMBER LISTED ABOVE.    Additional comments:  Patient in agreement with HHS PT.    HOME HEALTH REFERRAL    PATIENT"S DEMOGRAPHICS:        Name: Victoria Woodward    Discharge Address: [address]  [place] [address]      Primary Telephone Number:  [phone], patient's cell  Secondary Telephone Number:  NA  Emergency Contact and Number: Extended Emergency Contact Information  Primary Emergency Contact: Pirozzi,NEKAYA  Mobile Phone: [phone]  Relation: Daughter        Ordering Physician: Vernell Leep, MD     Following Physician: Dr. Lianne Bushy     PCP: PCP None, MD, None    Agreeable to Follow: Yes  Date/Time of Call: email sent    Language/Communication Barrier:  No    Primary Diagnosis and Reason for Services:     Closed wedge compression fracture of L1 vertebra, initial encounter    Pain, acute due to trauma    Fall     Questions Relating to COVID-19:    1. Have you been out of the country in the past 2 weeks? no  2. Do you have any upper respiratory symptoms (cough, SOB, fever)?  no  3. Have you been exposed to anyone with COVID-19 virus? no    Discharge Date: 04/17/2020  Referral Source (PACC/Hospital/Unit): Jason Fila, RN  Referral Date: 04/17/20    Home Health face-to-face (FTF) Encounter (Order [id number])  Consult  Date: 04/17/2020 Department: [facility] CCW Ground Unit Ordering/Authorizing: Vernell Leep, MD   Order Information    Order Date/Time Release Date/Time Start Date/Time End Date/Time   04/17/20 03:28 PM None 04/17/20 03:26 PM 04/17/20  03:26 PM   Order Details    Frequency Duration Priority Order Class   Once 1 occurrence Routine Hospital Performed   Standing Order Information    Remaining Occurrences Interval Last Released     0/1 Once 04/17/2020           Provider Information    Ordering User Ordering Provider Authorizing Provider   Jason Fila, RN Vernell Leep, MD Vernell Leep, MD   Attending Provider(s) Admitting Provider PCP   Arlana Lindau, MD; Michel Santee, MD; Vernell Leep, MD Vernell Leep, MD Pcp, None, MD   Verbal Order Info    Action Created on Order Mode Entered by Responsible Provider Signed by Signed on   Ordering 04/17/20 1528 Telephone with Mardene Celeste, RN Vernell Leep, MD     Comments    Home PT  required for gait and balance training, strengthening, mobility, fall prevention, and ADL training.     Diagnosis:   Gait training   Closed wedge compression fracture of L1 vertebra, initial encounter   Pain, acute due to trauma   Fall         Order Questions    Question Answer Comment   Date I saw the patient face-to-face: 04/17/2020    Evidence this patient is homebound because: C. Decreased endurance, strength, ROM, cadence, safety/judgment during mobility    Medical conditions that necessitate Home Health care: B. Functional impairment due to recent hospitalization/procedure/treatment    Per clinical findings, following services are medically necessary: PT    Clinical findings that support the need for Physical Therapy. PT will A. Evaluate and treat functional impairment and improve mobility     C. Educate on weight bearing status, stair/gait training, balance & coordination     D. Provide services to help restore function, mobility, and releive pain     E. Educate on functional mobility; bed, chair, sit, stand and transfer activities     F. Perform home safety assessment & develop safe in home exercise program     G. Implement activities to improve stance time, cadence & step length     H. Educate on the safe use of assistive device/ durable medical equipment    Other (please specify) Dr. Lianne Bushy to follow in community          Process Instructions    Please select Home Care Services medically necessary.     Based on the above findings, I certify that this patient is confined to the home and needs intermittent skilled nursing care, physical therapry and / or speech therapy or continues to need occupational therapy. The patient is under my care, and I have initiated the establishment of the plan of care. This patient will be followed by a physician who will periodically review the plan of care.    Collection Information    Consult Order Info    ID Description Priority Start Date Start Time    [id number] Home Health face-to-face (FTF) Encounter Routine 04/17/2020 3:26 PM   Provider Specialty Referred to   ______________________________________ _____________________________________   Verbal Order Info    Action Created on Order Mode Entered by Responsible Provider Signed by Signed on   Ordering 04/17/20 1528 Telephone with Mardene Celeste, RN Vernell Leep, MD     Patient Information    Patient Name   Victoria Woodward, Victoria Woodward Legal Sex   Female DOB   05/23/1981   Additional Information    Associated Reports External References   Priority  and Order Details [employer]

## 2020-04-17 NOTE — Progress Note - Problem Oriented Charting Notewrit (Signed)
Discharge plan  Home with [facility] services    Referral sent to TCM for post discharge and HHC services follow up via Epic. RN, [facility] aware.    Saron Tweed Johnson-Addo, MSW  SW Case Manager  Case Manager Department  [phone]

## 2020-04-17 NOTE — OT Progress Note (Signed)
[facility]   Occupational Therapy Treatment     Patient: Victoria Woodward    MRN#: [id number]   Unit: [facility] CCW GROUND UNIT  Bed: FG40/FG40.01      Post Acute Care Therapy Recommendations:   Discharge Recommendations: Home with supervision     Milestones to be reached to achieve recommendation: None  Anticipate achievement in n/a sessions    DME Recommended for Discharge: Reacher, Long-handled sponge, BSC, Shower chair      Therapy discharge recommendations may change with patient status.  Please refer to most recent note for up-to-date recommendations.    Assessment:   Significant Findings: Significant pain 9/10    Pt agreeable to participation in OT tx. Pt received in bed, completed bed mobility with SBA, donned TLSO EOB with min A, verbalized understanding of donning TLSO and spinal precautions, completed grooming activities in standing with SBA. Functional mobility with RW and SBA for HH distance, pt required increased time. Pt is limited by pain but able to complete functional tasks safely. D/C OT inpatient services at this time. Patient left without needs and call bell within reach. RN notified of session outcome.    Patient will benefit from continued inpatient OT services to increase independence with ADL/IADL activities.  Treatment Activities: Self Care    Educated the patient to role of occupational therapy, plan of care, goals of therapy and safety with mobility and ADLs, energy conservation techniques, pursed lip breathing, discharge instructions, home safety.    Plan:    OT Frequency Recommended: therapy discontinued     Continue plan of care.       Precautions and Contraindications:   Falls  TLSO EOB  Spine precautions    Updated Medical Status/Imaging/Labs:  Reviewed    Subjective: "I'm in 9/10 pain"   Patient's medical condition is appropriate for Occupational Therapy intervention at this time.  Patient is agreeable to participation in the therapy session.    Pain:    Scale: 9/10  Location: Lumbar region  Intervention: Heat pack, repositioning    Objective:   Patient received in bed with telemetry monitors, foley fall mat, bed alarm, no SCDs in place .  Vital signs monitored and stable throughout session.  Pt wore mask during therapy session:No     PPE worn during session: procedural mask, goggles and gloves  Tech present: No  PPE worn by tech: N/A    Cognition   A&Ox4    Functional Mobility  Rolling:  Supervision  Supine to Sit: Supervision  Sit to Stand: SBA with RW  Transfers: SBA with RW    PMP - Progressive Mobility Protocol   PMP Activity: Step 6 - Walks in Room       Balance  Static Sitting: Good  Dynamic Sitting: Fair  Static Standing: Good  Dynamic Standing: Fair-    Self Care and Home Management  Eating: indp hand to mouth  Grooming: SBA for washing face, brushing teeth in supported sitting  Bathing: Set up for sponge bath in chair  UE Dressing: Set up in supported sitting  LE Dressing: Set up and increased time for socks  Toileting: SBA      Participation: Good  Endurance: Fair-    Patient left with call bell within reach, all needs met, SCDs on, fall mat in place, bed alarm off, chair alarm on and all questions answered. RN notified of session outcome and patient response.     Goals:  Time For Goal Achievement: 1  visit  ADL Goals  Patient will dress lower body: Supervision, Goal met  Mobility and Transfer Goals  Pt will transfer bed to toilet: Supervision, Goal met                         Ihor Austin, OTR/L  Pager #[phone]      Time of Treatment  OT Received On: 04/17/20  Start Time: 0800  Stop Time: 0830  Time Calculation (min): 30 min    Treatment # 1 of 1 visit

## 2020-04-17 NOTE — Progress Notes (Addendum)
Name of DME company: [employer]   Phone: [phone]  Fax: [phone]    Note:    Adult RW delivered to patient at bedside.    04/20/20 12:29 PM    RW receipt and documentation sent to DME company.

## 2020-04-17 NOTE — Progress Notes (Signed)
[facility] for [employer] (previously Transitional Services Clinic):     Received a referral to schedule a follow up appointment with the [facility] for [employer].  Appointment scheduled for Tuesday 04/21/20 at 9:00am with Dr. Lanell Matar at [place] location.      Clinic address is as follows:    [address] [place], [address]      Please notify patient to arrive 15 minutes early to the appointment and bring the following materials with them:    Insurance card (if insured) and photo ID  Medications in their original bottles  Glucometer/blood sugar log (if diabetic)  Weight log (if heart failure)  Proof of income (to enroll in medication assistance programs-first two pages of signed 1040 tax forms or last 2 months of pay stubs)      Terressa Koyanagi  [facility] for  [facility] Reg Rep II  T [phone]  F [phone]

## 2020-04-17 NOTE — Progress Notes (Signed)
Evaluated patient at bedside who reports that her pain is well controlled with the current regimen when still.  Notes increases in pain when moving around.  States that she is ready to go home with some concerns of pain control especially with being mobile.  Pt inquired about percocet for pain control, explained that percocet was tylenol and oxycodone, which she was getting, just separate to allow for increase in frequency of oxycodone if needed.  Reassured patient that pain would improve day by day and that though moving may cause pain that immobility would cause more pain overall.  Patient expressed understanding and felt comfortable going home with current regimen.    Of note, patient voided spontaneously after foley was pulled this afternoon.  Tolerating PO.  Will work with PT at home.     Elvina Mattes, MD  Trauma

## 2020-04-17 NOTE — Progress Notes (Signed)
Neuro: A/Ox4; no reports of numbness or tingling  CV: no tele  Resp: RA  GU: voided post foley removal  GI: tolerating diet; no c/o n/v  Integ: intact  Musc: ambulates x1 assist w/ walker - difficulty due to pain  Isolation: none  Pain: c/o continuous severe pain to low back - admin PRN and scheduled medications with little to no relief  Access: PIV removed prior to d/c  Safety: high fall risk - fall mat in place, call bell w/in reach, bed alarm on  Interpreter: none needed    NOTE:  Medications admin per orders  Patients pain unrelieved from available medications and heat packs, continuously rated pain 8-11/10 - patient agreeable with plan to discharge home w/ [facility] and follow up    Discharge instructions given, patient verbalized understanding and signed AVS  IV removed  2 prescriptions delivered by pharmacy - gabapentin not filled due to patients recent fill on 5/27  TLSO brace on patient - [employer] handout given to patient for reference  Patient took wheelchair to lobby for d/c home

## 2020-04-17 NOTE — Progress Notes (Signed)
Foley catheter removed  Patient tolerated it well  Educated on need to void in 6 hours and process if unable to void  Will continue to monitor

## 2020-04-17 NOTE — PT Progress Note (Signed)
[facility]   Physical Therapy Treatment  Patient:  Victoria Woodward MRN#:  [id number]  Unit: [facility] CCW GROUND UNIT  Bed: FG40/FG40.01    Post Acute Care Therapy Recommendations:   Therapy Discharge Recommendations: Home with home health PT, Home with supervision     Milestones to be reached to achieve recommendation: None - still with significant pain but able to complete stairs    DME Recommended for Discharge: Front wheel walker, BSC, Shower chair    If Home with home health PT, Home with supervision recommended discharge disposition is not available, patient will need SNF.    Therapy discharge recommendations may change with patient status.  Please refer to most recent note for up-to-date recommendations.    Assessment:   Significant Findings: Significant pain limiting endurance    Patient presents with improved stair tolerance, completing with CGA this session, but continues to show decreased functional mobility, impaired gait, decreased endurance, and decreased balance. Pt would continue to benefit from PT to address these deficits and increase functional independence.    Treatment Activities: Gait training    Educated the patient to role of physical therapy, plan of care, goals of therapy and safety with mobility and ADLs, spine precautions.    Plan:   PT Frequency: 4-5x/wk    Continue plan of care.       Precautions and Contraindications:   Falls  Spinal precautions  TLSO when OOB - ok to don/doff at EOB    Updated Medical Status/Imaging/Labs:   Reviewed    Subjective:    Pt reports she is nervous about being able to get around at home.  Patient's medical condition is appropriate for Physical Therapy intervention at this time. Nursing clears pt for PT. Patient is agreeable to participation in the therapy session.    Pain:   Scale: 7-10/10  Location: back  Intervention: positioned for comfort    Objective:   Patient is in bed with indwelling urinary catheter and TLSO in place. Pt  wore mask during therapy session:Yes    Cognition  Alert and oriented throughout session   Follows commands without difficulty    Functional Mobility  Rolling: supervision  Sit to Supine: min A for B LE  Sit to Stand: min A  Stand to Sit: min A    Ambulation  Level of Assistance required: stand by assist   PMP - Progressive Mobility Protocol   PMP Activity: Step 7 - Walks out of Room  Distance Walked (ft) (Step 6,7): 100 Feet   Pattern: no LOB, significant pain requiring one standing rest break and one seated rest break  Device Used for Ambulation: front wheel walker   Weightbearing Status: no restrictions  Stair Management: CGA  Number of Stairs: 3  Device Used for Stairs: two hands on railing  Pattern on Stairs: no LOB but significant pain    Balance  Static Sitting: Good  Dynamic Sitting: Fair+  Static Standing: Fair with front wheel walker   Dynamic Standing: Fair with front wheel walker     Patient Participation: Good  Patient Endurance: Fair    Patient left with call bell within reach, all needs met, SCDs in place, fall mat in place, and bed alarm in place and all questions answered. RN notified of session outcome and patient response.     Goals:  Goals  Goal Formulation: With patient  Time for Goal Acheivement: 2 visits  Pt Will Go Supine To Sit: modified independent  Pt  Will Transfer Bed/Chair: independent(or with LRD)  Pt Will Ambulate: > 200 feet, with rolling walker, modified independent(or no device)  Pt Will Go Up / Down Stairs: 1 flight, with supervision  Other Goal: (don/doff TLSO brace independently )    PPE worn during session: procedural mask, goggles and gloves    Tech present: None  PPE worn by tech: N/A      Dolphus Jenny, PT, DPT   Pager number: [phone]    Time of Treatment:  PT Received On: 04/17/20  Start Time: 0160  Stop Time: 1011  Time Calculation (min): 26 min  Treatment # 1 out of 2 visits

## 2020-04-17 NOTE — Progress Notes (Signed)
A&Ox4, VSS. Hypertensive when pain increases. Severe pain to lower back. Ambulates w/ 1 assist.  TSLO brace when OOB  Regular diet, tolerating well. No BM this shift  Straight cathed on previous shift, was DTV at 1845, unable to. Straight cath w/ foley and kept foley in d/t post void amount at 800cc.  Plan for pain control for 1-2 days  WCTM    Note: Pt expressed that most of her pain medications are not relieving her pain enough to be able to move out of bed. She states that dilaudid has helped the most but wishes it could help more w/ a possible increase in the dose.  Called resident to increase dilaudid dose, however resident explained that she is on multiple pain medications and it will be unsafe to increase dose.

## 2020-04-17 NOTE — Final Progress Note (DC Note for stay less than 48 (Signed)
ACUTE CARE SURGERY / TRAUMA DAILY PROGRESS NOTE     Date/Time: 04/17/20 4:40 PM  Patient Name: Victoria Woodward  Primary Care Physician: Pcp, None, MD  Hospital Day: 0    Post-op Day:      Assessment/Plan:     The patient has the following active problems:  Active Hospital Problems    Diagnosis    Closed wedge compression fracture of L1 vertebra, initial encounter    Pain, acute due to trauma    Fall        Plan by systems:  Neuro: Acute pain due to trauma, L1 fracture and small T9-11 fractures.  - TLSO when OOB, f/u with NSGY 6 wks w/ repeat spine xrays  - Sch tylenol, gaba, lido, toradol PRN flex, oxy, dilaudid  Pulm:SORA  - IS, Pulm toilet  CV: HDS, intermittent high blood pressure  - Monitor vital signs  Endo:   - Monitor BG PRN  GI:  NAI  - Regular diet  Bowel regimen: pericolace  Heme/ID: AF  - Monitor for s/s of infection  DVT px: SCDs, Lovenox BID  Renal:  Foley placed yesterday PM for retention, aUOP  - Monitor UOP  Foley: Y  Neuromuscular: No other injuries at this time  - PT/OT  Psych:  - Supportive  - Continue home venlafaxine and PRN clonazepam  Wounds:  - LWC PRN    Dispo: HwS, anticipate d/c today with pain control    Interval History:   Victoria Woodward is a 39 y.o. female who presents to the hospital after fall while playing with dog while playing with her dog, sustaining L1 fracture and small T9-11 fractures.    Significant overnight events include NAEO  Slept well overnight.  Pain more improved.  No numbness or weakness.  Tolerating PO.  Would like to work with PT again today before d/c    Allergies:     Allergies   Allergen Reactions    Morphine Itching       Medications:     Scheduled Medications:   Current Facility-Administered Medications   Medication Dose Route Frequency    acetaminophen  650 mg Oral 4 times per day    enoxaparin  0.5 mg/kg Subcutaneous Q12H    gabapentin  400 mg Oral Q8H SCH    ibuprofen  800 mg Oral 4 times per day    lidocaine  1 patch Transdermal  Q24H    senna-docusate  1 tablet Oral QHS    venlafaxine  37.5 mg Oral Q12H     Infusion Medications:     PRN Medications:   clonazePAM, cyclobenzaprine, oxyCODONE **OR** oxyCODONE      Labs:     Recent Labs   Lab 04/15/20  0428   WBC 7.93   RBC 4.74   Hgb 13.9   Hematocrit 42.4   Platelets 216   Glucose 86   BUN 11.2   Creatinine 0.8   Calcium 9.0   Sodium 142   Potassium 4.2   Chloride 104   CO2 23       Rads:   Radiological Procedure reviewed.    No results found.    Physical Exam:     Vital Signs:  Vitals:    04/17/20 1505   BP: 145/90   Pulse: 95   Resp: 17   Temp: 98 F (36.7 C)   SpO2: 97%      Ideal body weight: 52.4 kg (115 lb 7.7 oz)  Adjusted ideal body weight:  76.2 kg (168 lb 0.9 oz)  Body mass index is 43.75 kg/m.     I/O:  Intake and Output Summary (Last 24 hours) at Date Time    Intake/Output Summary (Last 24 hours) at 04/17/2020 1640  Last data filed at 04/17/2020 1610  Gross per 24 hour   Intake 350 ml   Output 1800 ml   Net -1450 ml        Vent Settings:       Nutrition:   Orders Placed This Encounter   Procedures    Diet regular       Physical Exam:  Physical Exam  Vitals reviewed.   Constitutional:       General: She is not in acute distress.     Appearance: Normal appearance. She is obese. She is not ill-appearing or toxic-appearing.   HENT:      Head: Normocephalic.      Right Ear: External ear normal.      Left Ear: External ear normal.      Mouth/Throat:      Mouth: Mucous membranes are moist.      Pharynx: No oropharyngeal exudate.   Eyes:      General: No scleral icterus.     Extraocular Movements: Extraocular movements intact.      Conjunctiva/sclera: Conjunctivae normal.      Pupils: Pupils are equal, round, and reactive to light.   Cardiovascular:      Rate and Rhythm: Normal rate and regular rhythm.      Pulses: Normal pulses.   Pulmonary:      Effort: Pulmonary effort is normal. No respiratory distress.      Breath sounds: Normal breath sounds. No wheezing.   Chest:      Chest wall:  No tenderness.   Abdominal:      General: There is no distension.      Palpations: Abdomen is soft.      Tenderness: There is no abdominal tenderness. There is no guarding or rebound.   Genitourinary:     Comments: Foley in place  Musculoskeletal:         General: No deformity. Normal range of motion.      Cervical back: Normal range of motion and neck supple. No rigidity. No muscular tenderness.      Thoracic back: Tenderness (R > L paraspinal TTP) and bony tenderness present.      Right lower leg: No edema.      Left lower leg: No edema.   Skin:     General: Skin is warm.      Findings: No rash.   Neurological:      General: No focal deficit present.      Mental Status: She is alert and oriented to person, place, and time.      Sensory: No sensory deficit.      Motor: No weakness.   Psychiatric:         Mood and Affect: Mood normal.       Discharge Medications:        Medication List      START taking these medications    acetaminophen 325 MG tablet  Commonly known as: TYLENOL  Take 2 tablets (650 mg total) by mouth every 6 (six) hours as needed for Pain     cyclobenzaprine 5 MG tablet  Commonly known as: FLEXERIL  Take 1 tablet (5 mg total) by mouth 3 (three) times daily as needed (pain)     ibuprofen  800 MG tablet  Commonly known as: ADVIL  Take 1 tablet (800 mg total) by mouth every 6 (six) hours as needed for Pain     oxyCODONE 5 MG immediate release tablet  Commonly known as: ROXICODONE  Take 1 tablet (5 mg total) by mouth every 6 (six) hours as needed for Pain     senna-docusate 8.6-50 MG per tablet  Commonly known as: PERICOLACE  Take 1 tablet by mouth nightly Take up to two times per day when taking narcotics        CHANGE how you take these medications    gabapentin 100 MG capsule  Commonly known as: NEURONTIN  Take 2 capsules (200 mg total) by mouth every 8 (eight) hours  What changed:    how much to take   when to take this        CONTINUE taking these medications    ALPRAZolam 0.5 MG tablet  Commonly  known as: XANAX     clonazePAM 1 MG tablet  Commonly known as: KlonoPIN     escitalopram 10 MG tablet  Commonly known as: LEXAPRO     ondansetron 4 MG tablet  Commonly known as: ZOFRAN           Where to Get Your Medications      These medications were sent to [facility]  [address] [place] [address]    Hours: Monday - Friday 8AM to 8PM, Saturday - Sunday 9A to 5PM Phone: [phone]    cyclobenzaprine 5 MG tablet   gabapentin 100 MG capsule   oxyCODONE 5 MG immediate release tablet     Information about where to get these medications is not yet available    Ask your nurse or doctor about these medications   acetaminophen 325 MG tablet   ibuprofen 800 MG tablet   senna-docusate 8.6-50 MG per tablet         Disposition:   Home    Follow Up:   Marlaine Hind, MD  [address]  [place] [address]  [phone]    Schedule an appointment as soon as possible for a visit in 6 week(s)  Please call as soon as possible to schedule an appointment with Neurosurgery within 6 weeks with repeat L-spine xrays completed 1-3 days prior.  1. Call to schedule Neurosurgery appointment. Ask clinic to order xrays  2. Call to schedule xrays (see above)    [facility]  [address]  [address]  [place] [address]  [phone]  Follow up  You do not need to follow up with trauma clinic. Please call with questions or concerns.    [facility]  [address]  [place] [address]  [phone]  Call in 2 days  As needed if no contact from home health physical therapist    [facility]  [address]  [place] [address]  [phone]  Follow up  Follow-up with the transitional clinic or find a primary care physician for management of your overal health and to assist with physical therapy set up      Signed by: Jeanelle Malling Dayan Kreis    At the time of discharge the patient  was given instructions detailing discharge care and time (30 minutes) was taken to answer questions.    Attending Attestation:

## 2020-04-17 NOTE — Progress Notes (Addendum)
FWW and BSC commode ordered, DME tech notified.  FWW to be delivered to patient's hospital room before discharge and [facility] to be delivered to patient's home if approved by insurance.    6/4 1520:  Patient recommended for [facility] PT.  Patient reports not having a PCP, CM Comfort Johnson, notified and stated she will coordinate TCM appt.    Jason Fila

## 2020-04-17 NOTE — UM Notes (Signed)
OBS CSR for April 17, 2020  Unit: Medicine/ short stay    39 year old female admitted to [facility]BS with L1 spine fracture for pain control and PT/OT  Neurosurgery recommends TLSO brace when OOB.    PT/OT recommending home with supervision  Foley placed yesterday for retention,    Significant overnight events include NAEO  Slept well overnight.  Pain more improved.  No numbness or weakness.  Tolerating PO.  Would like to work with PT again today before d/c    BP 140/90    Pulse 78    Temp 97.9 F (36.6 C) (Oral)    Resp 17    Ht 1.6 m (5' 2.99")    Wt 112 kg (246 lb 14.6 oz)    SpO2 97%    BMI 43.75 kg/m       Current Facility-Administered Medications   Medication Dose Route Frequency    acetaminophen  650 mg Oral 4 times per day    enoxaparin  0.5 mg/kg Subcutaneous Q12H    gabapentin  400 mg Oral Q8H SCH    ketorolac  15 mg Intravenous 4 times per day    lidocaine  1 patch Transdermal Q24H    senna-docusate  1 tablet Oral QHS    venlafaxine  37.5 mg Oral Q12H       Prn meds: as of 10:20 am  Flexeril 10 mg po X 1  Dilaudid 0.2 mg IV X 4  Oxycodone 10 mg po X 1    Plan:  IS, pulmonary toilet  Regular diet  Foley care  PT/OT  Lovenox BID  Pain control - prn oxycodone  TLSO brace when OOB     Dispo:HwS, anticipate d/c todaywith pain control    Pollyann Kennedy, RN, BSN   UR Case Manager   [phone] voice mail only  Case Management Department   [facility]   [address]   [place], [place] [address]  [email or web address]    For Immediate assistance:  Case Management Office:  Ph: [phone]  Fax: [phone]  Please use fax number to provide authorization for hospital services or to request additional information

## 2020-04-17 NOTE — Progress Notes (Signed)
ACUTE CARE SURGERY / TRAUMA DAILY PROGRESS NOTE     Date/Time: 04/17/20 6:05 AM  Patient Name: Victoria Woodward  Primary Care Physician: Pcp, None, MD  Hospital Day: 0    Post-op Day:      Assessment/Plan:     The patient has the following active problems:  Active Hospital Problems    Diagnosis    Closed wedge compression fracture of L1 vertebra, initial encounter    Pain, acute due to trauma    Fall        Plan by systems:  Neuro: Acute pain due to trauma, L1 fracture and small T9-11 fractures.  - TLSO when OOB, f/u with NSGY 6 wks w/ repeat spine xrays  - Sch tylenol, gaba, lido, toradol PRN flex, oxy, dilaudid  Pulm:SORA  - IS, Pulm toilet  CV: HDS, intermittent high blood pressure  - Monitor vital signs  Endo:   - Monitor BG PRN  GI:  NAI  - Regular diet  Bowel regimen: pericolace  Heme/ID: AF  - Monitor for s/s of infection  DVT px: SCDs, Lovenox BID  Renal:  Foley placed yesterday PM for retention, aUOP  - Monitor UOP  Foley: Y  Neuromuscular: No other injuries at this time  - PT/OT  Psych:  - Supportive  - Continue home venlafaxine and PRN clonazepam  Wounds:  - LWC PRN    Dispo: HwS, anticipate d/c today with pain control    Interval History:   Victoria Woodward is a 39 y.o. female who presents to the hospital after fall while playing with dog while playing with her dog, sustaining L1 fracture and small T9-11 fractures.    Significant overnight events include NAEO  Slept well overnight.  Pain more improved.  No numbness or weakness.  Tolerating PO.  Would like to work with PT again today before d/c    Allergies:     Allergies   Allergen Reactions    Morphine Itching       Medications:     Scheduled Medications:   Current Facility-Administered Medications   Medication Dose Route Frequency    acetaminophen  650 mg Oral 4 times per day    enoxaparin  0.5 mg/kg Subcutaneous Q12H    gabapentin  400 mg Oral Q8H SCH    ketorolac  15 mg Intravenous 4 times per day    lidocaine  1 patch  Transdermal Q24H    senna-docusate  1 tablet Oral QHS    venlafaxine  37.5 mg Oral Q12H     Infusion Medications:     PRN Medications:   clonazePAM, cyclobenzaprine, HYDROmorphone, oxyCODONE **OR** oxyCODONE      Labs:     Recent Labs   Lab 04/15/20  0428   WBC 7.93   RBC 4.74   Hgb 13.9   Hematocrit 42.4   Platelets 216   Glucose 86   BUN 11.2   Creatinine 0.8   Calcium 9.0   Sodium 142   Potassium 4.2   Chloride 104   CO2 23       Rads:   Radiological Procedure reviewed.    No results found.    Physical Exam:     Vital Signs:  Vitals:    04/17/20 0410   BP: 127/88   Pulse: 70   Resp: 16   Temp: 98.4 F (36.9 C)   SpO2: 100%      Ideal body weight: 52.4 kg (115 lb 7.7 oz)  Adjusted ideal body  weight: 76.2 kg (168 lb 0.9 oz)  Body mass index is 43.75 kg/m.     I/O:  Intake and Output Summary (Last 24 hours) at Date Time    Intake/Output Summary (Last 24 hours) at 04/17/2020 0605  Last data filed at 04/17/2020 0000  Gross per 24 hour   Intake 700 ml   Output 750 ml   Net -50 ml        Vent Settings:       Nutrition:   Orders Placed This Encounter   Procedures    Diet regular       Physical Exam:  Physical Exam  Vitals reviewed.   Constitutional:       General: She is not in acute distress.     Appearance: Normal appearance. She is obese. She is not ill-appearing or toxic-appearing.   HENT:      Head: Normocephalic.      Right Ear: External ear normal.      Left Ear: External ear normal.      Mouth/Throat:      Mouth: Mucous membranes are moist.      Pharynx: No oropharyngeal exudate.   Eyes:      General: No scleral icterus.     Extraocular Movements: Extraocular movements intact.      Conjunctiva/sclera: Conjunctivae normal.      Pupils: Pupils are equal, round, and reactive to light.   Cardiovascular:      Rate and Rhythm: Normal rate and regular rhythm.      Pulses: Normal pulses.   Pulmonary:      Effort: Pulmonary effort is normal. No respiratory distress.      Breath sounds: Normal breath sounds. No wheezing.    Chest:      Chest wall: No tenderness.   Abdominal:      General: There is no distension.      Palpations: Abdomen is soft.      Tenderness: There is no abdominal tenderness. There is no guarding or rebound.   Genitourinary:     Comments: Foley in place  Musculoskeletal:         General: No deformity. Normal range of motion.      Cervical back: Normal range of motion and neck supple. No rigidity. No muscular tenderness.      Thoracic back: Tenderness (R > L paraspinal TTP) and bony tenderness present.      Right lower leg: No edema.      Left lower leg: No edema.   Skin:     General: Skin is warm.      Findings: No rash.   Neurological:      General: No focal deficit present.      Mental Status: She is alert and oriented to person, place, and time.      Sensory: No sensory deficit.      Motor: No weakness.   Psychiatric:         Mood and Affect: Mood normal.          Attending Attestation:

## 2020-04-17 NOTE — Progress Notes (Signed)
D/C HOME TODAY WITH HOME HEALTH NEEDS  [facility] APPOINTMENT SCHEDULED  FAMILY TO PROVIDE TRANSPORT  BEDSIDE NURSE AWARE     04/17/20 1656   Discharge Disposition   Patient preference/choice provided? Yes   Physical Discharge Disposition Home, Home Health   Receiving facility, unit and room number: na   Nursing report phone number: na   Facility fax number: na   Mode of Transportation Car   Patient/Family/POA notified of transfer plan Yes   Patient agreeable to discharge plan/expected d/c date? Yes   Family/POA agreeable to discharge plan/expected d/c date? Yes   Bedside nurse notified of transport plan? Yes   CM Interventions   Follow up appointment scheduled? Yes  (TCM)   Is this a Medicare focused or COVID patient? No   Is this appointment within 48-72 hours? No   Referral made for home health RN visit? No, Other (comment)   Multidisciplinary rounds/family meeting before d/c? Yes   Medicare Checklist   Patient received 1st IMM Letter? n/a     Victoria Woodward, MSW  SW Case Manager  Case Manager Department  [phone]

## 2020-04-21 ENCOUNTER — Ambulatory Visit (INDEPENDENT_AMBULATORY_CARE_PROVIDER_SITE_OTHER): Payer: BLUE CROSS/BLUE SHIELD | Admitting: Hospitalist

## 2020-04-27 ENCOUNTER — Encounter (INDEPENDENT_AMBULATORY_CARE_PROVIDER_SITE_OTHER): Payer: Self-pay | Admitting: Family

## 2020-04-27 ENCOUNTER — Ambulatory Visit (INDEPENDENT_AMBULATORY_CARE_PROVIDER_SITE_OTHER): Payer: BLUE CROSS/BLUE SHIELD | Admitting: Family

## 2020-04-27 ENCOUNTER — Encounter (INDEPENDENT_AMBULATORY_CARE_PROVIDER_SITE_OTHER): Payer: Self-pay

## 2020-04-27 VITALS — BP 147/94 | HR 108 | Temp 97.3°F | Resp 16 | Wt 246.2 lb

## 2020-04-27 DIAGNOSIS — S32010A Wedge compression fracture of first lumbar vertebra, initial encounter for closed fracture: Secondary | ICD-10-CM

## 2020-04-27 DIAGNOSIS — F329 Major depressive disorder, single episode, unspecified: Secondary | ICD-10-CM

## 2020-04-27 DIAGNOSIS — G8911 Acute pain due to trauma: Secondary | ICD-10-CM

## 2020-04-27 DIAGNOSIS — F4323 Adjustment disorder with mixed anxiety and depressed mood: Secondary | ICD-10-CM

## 2020-04-27 DIAGNOSIS — W19XXXS Unspecified fall, sequela: Secondary | ICD-10-CM

## 2020-04-27 DIAGNOSIS — F32A Depression, unspecified: Secondary | ICD-10-CM

## 2020-04-27 MED ORDER — IBUPROFEN 800 MG PO TABS
800.0000 mg | ORAL_TABLET | Freq: Four times a day (QID) | ORAL | 0 refills | Status: DC | PRN
Start: 2020-04-27 — End: 2020-05-19

## 2020-04-27 NOTE — Patient Instructions (Addendum)
Victoria Hind, MD  [address]  [place] [address]  [phone]    Schedule appointment with Neurosurgery within 6 weeks with repeat L-spine xrays completed 1-3 days prior.    Call to schedule Neurosurgery appointment. Ask clinic to order xrays    Call to schedule xrays

## 2020-04-27 NOTE — Progress Notes (Signed)
History of Present Illness:   This patient is a 39 y.o. female who presented to the ED after fall while playing with her dog, sustaining L1 fracture and small T9-11 fractures.    Patient presents to [facility] for a follow up of her recent hospitalization. Patient tearful during visit expresses anxiety denies depression. Pyschiatrist Dr. Trish Fountain. No longer on Lexapro discontinued by phychiatric. Prescribed Venlafaxine 37.5 mg QD, which patient reports that is not helping. Advised to discuss with psychiatrist. Endorses intermittent headaches which are not new. Currently denies palpitations, nausea, vomiting, diarrhea, orthopnea, PND, dysuria. No dizziness, confusion, focal weakness or numbness. No speech or swallowing problems. No bowel or bladder incontinence. No fever or chills.    BP 147/94, HR 108    Past Medical History:   Diagnosis Date    Anxiety     Depression      History reviewed. No pertinent surgical history.  History reviewed. No pertinent family history.  Social History     Tobacco Use    Smoking status: Current Every Day Smoker     Types: Cigarettes    Smokeless tobacco: Never Used   Substance Use Topics    Alcohol use: Yes     Alcohol/week: 1.0 standard drinks     Types: 1 Standard drinks or equivalent per week    Drug use: Never       Allergies:     Allergies   Allergen Reactions    Morphine Itching       Medications:     Current Outpatient Medications:     acetaminophen (TYLENOL) 325 MG tablet, Take 2 tablets (650 mg total) by mouth every 6 (six) hours as needed for Pain, Disp: , Rfl:     ALPRAZolam (XANAX) 0.5 MG tablet, Take 0.5 mg by mouth nightly as needed, Disp: , Rfl:     clonazePAM (KlonoPIN) 1 MG tablet, Take 1 mg by mouth daily  , Disp: , Rfl:     cyclobenzaprine (FLEXERIL) 5 MG tablet, Take 1 tablet (5 mg total) by mouth 3 (three) times daily as needed (pain), Disp: 30 tablet, Rfl: 0    escitalopram (LEXAPRO) 10 MG tablet, Take 10 mg by mouth daily, Disp: , Rfl:      gabapentin (NEURONTIN) 100 MG capsule, Take 2 capsules (200 mg total) by mouth every 8 (eight) hours, Disp: 42 capsule, Rfl: 0    ibuprofen (ADVIL) 800 MG tablet, Take 1 tablet (800 mg total) by mouth every 6 (six) hours as needed for Pain, Disp: , Rfl:     ondansetron (ZOFRAN) 4 MG tablet, Take 4 mg by mouth every 8 (eight) hours as needed for Nausea, Disp: , Rfl:     oxyCODONE (ROXICODONE) 5 MG immediate release tablet, Take 1 tablet (5 mg total) by mouth every 6 (six) hours as needed for Pain, Disp: 30 tablet, Rfl: 0    senna-docusate (PERICOLACE) 8.6-50 MG per tablet, Take 1 tablet by mouth nightly Take up to two times per day when taking narcotics, Disp:  , Rfl:     Physical Exam:   There were no vitals taken for this visit.  Wt Readings from Last 3 Encounters:   04/16/20 112 kg (246 lb 14.6 oz)       Physical Exam  General: awake, alert, oriented x 3  Cardiovascular: S1, S2, regular rate and rhythm, no murmurs, rubs, or gallops  Lungs: clear to auscultation bilaterally without wheezing, rhonchi, or rales  Abdomen: soft, non tender  Extremities: no edema  Skin: no rashes or lesions noted    Diagnostics:     Lab Results   Component Value Date    WBC 7.93 04/15/2020    HGB 13.9 04/15/2020    HCT 42.4 04/15/2020    PLT 216 04/15/2020    ALT 20 04/15/2020    AST 20 04/15/2020    NA 142 04/15/2020    K 4.2 04/15/2020    CL 104 04/15/2020    CREAT 0.8 04/15/2020    BUN 11.2 04/15/2020    CO2 23 04/15/2020    GLU 86 04/15/2020       XR Lumbar Spine AP And Lateral    Result Date: 04/15/2020  No significant interval change. [name], MD  04/15/2020 1:01 PM    CT Thoracic Spine WO Contrast    Result Date: 04/15/2020  1. Suspected tiny fractures involving the T9, T10, and T11 vertebral bodies without height loss. No canal narrowing. 2. No other fracture in the thoracic spine. 3. Critical results given via telephone to Vanessa Kick PA  on 04/15/2020 at 2:19 PM [name], MD  04/15/2020 2:26 PM    CT L- Spine without  Contrast    Result Date: 04/15/2020   Acute L1 vertebral body compression fracture. Mild loss of height. No retropulsion. COMMUNICATION: These urgent results were discussed with Dr. Arlana Lindau on 04/15/2020 4:01 AM. [name]  04/15/2020 4:09 AM    XR Chest AP Portable    Result Date: 04/15/2020  No acute cardioparenchymal process. Charlott Rakes, MD  04/15/2020 1:12 PM      Active Problems:     Patient Active Problem List   Diagnosis    Closed wedge compression fracture of L1 vertebra, initial encounter    Pain, acute due to trauma    Fall    Hyperlipidemia    Obese    Adjustment reaction with anxiety and depression    GERD (gastroesophageal reflux disease)       Assessment/Plan:   39 y.o. female     Aleja was seen today for follow-up.    Diagnoses and all orders for this visit:    Closed wedge compression fracture of L1 vertebra, initial encounter    Pain, acute due to trauma    Adjustment reaction with anxiety and depression    Fall, sequela      -Schedule appointment with Neurosurgery within 6 weeks with repeat L-spine xrays completed 1-3 days prior  -Call to schedule Neurosurgery appointment. Ask clinic to order xrays  -Call to schedule xrays  -Continue PT/OT    Other orders  - ibuprofen (ADVIL) 800 MG tablet; Take 1 tablet (800 mg total) by mouth every 6 (six) hours as needed for Pain  Dispense: 30 tablet; Refill: 0  - Referral to Occupational Therapy: [place]      Patient with no PCP will refer to [facility] to establish PCP. Patient encouraged to follow up with psychiatrist Dr. Trish Fountain.     Follow up: 2 weeks  Discharged from [facility]?: Yes  Medical Home Referred to/Referred Back to: [facility]

## 2020-04-27 NOTE — Progress Notes (Signed)
Completed DMV-Disabled Parking Application. Provider signed application. Patient informed to take to [facility] for processing.    Provided patient with after visit summary, reviewed medications and dosing instructions. Patient is without questions or concerns    Patient informed to call and schedule follow up with Neurosurgery.     Provided IMG-primary care informational sheet. Patient informed to call and schedule follow up.

## 2020-04-29 NOTE — Op Note (Signed)
NON-Operative Treatment of Spinal fracture in Orthosis  04/15/20   Victoria Woodward   MR [id number]  02-Apr-1981  Surgeon:   1. Rosemarie Beath MD  Pre-treatment Diagnosis:   1. L1 compression fracture  Post-treatment Diagnosis:  1. Same  Procedure Performed:  22310 : closed treatment of vertebral body fracture with brace    Indications for treatment of spinal fracture:  Victoria Woodward is a 39 yo female who presents today with complaints of LBP after falling while dog walking Further workup revealed L1  fracture. Their signs, symptoms and radiographic evidence was consistent with a spinal fracture.  Again the risk and benefits were discussed the extensive details with the (patient/family) in extensive detail. The risks and potential complications with bracing was explained and follow up was very important part of the process. I explained the potential for progression of the injury that was sustained. There is the possibility of chronic pain syndrome to develop from this significant fracture that may be triggered at a later date.  Treatment  I was asked to consult on the patient by the ER trauma team, and after careful evaluation of the patient's clinical and imaging exams had an extensive discussion with the patient and family about the diagnosis. There was no motor or sensory deficit, we decided to place the patient in a brace and get upright x-rays. The patient was measured and sized appropriately for the brace by the [employer]. Further upright imaging studies were ordered while in the brace showed no progression or change in angulation, compression or retropulsion of the fracture and the patient remained neurologically intact throughout hospital stay through discharge. The patient tolerated being upright in brace with minimal discomfort controlled well with pain meds.  PLAN:  At this time the patient is doing well.  1. The patient will continue a brace at all times.  2. Will follow up in clinic in two-four weeks with the  new upright x-rays.  3. If any changes in symptoms or neuro exam or worsening of pain, the patient knows to contact us immediately and return to the emergency room.  Rosemarie Beath MD

## 2020-04-30 ENCOUNTER — Encounter (INDEPENDENT_AMBULATORY_CARE_PROVIDER_SITE_OTHER): Payer: Self-pay

## 2020-04-30 NOTE — Progress Notes (Signed)
Patient called c/o continued back pain, especially with PT, making it difficult to participate.  For pain she is taking ibuprofen 800 mg q6hr and gabapentin 200 mg q8hr.  Loralee Pacas, NP, informed and per her instructions, continue above and add the oxycodone as needed.  Patient informed and voiced understanding.  She has appointment 05/04/20 to establish primary care.  Instructed to access urgent care if pain unmanageable.

## 2020-05-01 ENCOUNTER — Encounter (INDEPENDENT_AMBULATORY_CARE_PROVIDER_SITE_OTHER): Payer: Self-pay | Admitting: Internal Medicine

## 2020-05-04 ENCOUNTER — Encounter (INDEPENDENT_AMBULATORY_CARE_PROVIDER_SITE_OTHER): Payer: Self-pay | Admitting: Internal Medicine

## 2020-05-04 ENCOUNTER — Ambulatory Visit (INDEPENDENT_AMBULATORY_CARE_PROVIDER_SITE_OTHER): Payer: BLUE CROSS/BLUE SHIELD | Admitting: Internal Medicine

## 2020-05-04 VITALS — BP 120/80 | HR 88 | Temp 97.7°F | Ht 63.0 in | Wt 242.0 lb

## 2020-05-04 DIAGNOSIS — S32010A Wedge compression fracture of first lumbar vertebra, initial encounter for closed fracture: Secondary | ICD-10-CM

## 2020-05-04 NOTE — Progress Notes (Signed)
[facility] INTERNAL MEDICINE - [facility] [place] [employer]                  Date of Exam: 05/04/2020 3:22 PM        Patient ID: Victoria Woodward is a 39 y.o. female.        Chief Complaint:    Chief Complaint   Patient presents with    NP     REFERRAL             HPI:    HPI   Pt needs PCP since recently moved to the area from [place].  Pt was admitted to [facility] June 2-4 for L1 compression fx s/p fall. Neurosurgeon wants repeat xrays before f/u. Pt is receiving home PT. Pt stopped oxycodone and flexeril since they did not improve back pain. Denies weakness, numbness, B/B incontinence, fever, chills, sweats.          Problem List:    Patient Active Problem List   Diagnosis    Closed wedge compression fracture of L1 vertebra, initial encounter    Pain, acute due to trauma    Fall    Obese    Adjustment reaction with anxiety and depression             Current Meds:    Outpatient Medications Marked as Taking for the 05/04/20 encounter (Office Visit) with Renaldo Reel, MD   Medication Sig Dispense Refill    acetaminophen (TYLENOL) 325 MG tablet Take 2 tablets (650 mg total) by mouth every 6 (six) hours as needed for Pain      clonazePAM (KlonoPIN) 1 MG tablet Take 1 mg by mouth daily         gabapentin (NEURONTIN) 100 MG capsule Take 2 capsules (200 mg total) by mouth HS 42 capsule 0    ibuprofen (ADVIL) 800 MG tablet Take 1 tablet (800 mg total) by mouth every 6 (six) hours as needed for Pain 30 tablet 0    venlafaxine (EFFEXOR-XR) 37.5 MG 24 hr capsule Take 37.5 mg by mouth daily            Allergies:    Allergies   Allergen Reactions    Morphine Itching              Social History:    Social History     Tobacco Use    Smoking status: Current Every Day Smoker     Types: Cigarettes    Smokeless tobacco: Never Used   Vaping Use    Vaping Use: Never used   Substance Use Topics    Alcohol use: Not Currently     Alcohol/week: 1.0 standard drinks     Types: 1 Standard drinks or equivalent per week    Drug use:  Never           The following sections were reviewed this encounter by the provider:   Allergies              Vital Signs:    Vitals:    05/04/20 1403   BP: 120/80   Pulse: 88   Temp: 97.7 F (36.5 C)   Weight: 109.8 kg (242 lb)   Height: 1.6 m (5\' 3" )             ROS:    See HPI.           Physical Exam:    Physical Exam  Constitutional:       Appearance: Normal appearance.  HENT:      Head: Normocephalic and atraumatic.   Cardiovascular:      Rate and Rhythm: Normal rate and regular rhythm.      Pulses: Normal pulses.      Heart sounds: Normal heart sounds.   Pulmonary:      Effort: Pulmonary effort is normal.      Breath sounds: Normal breath sounds.   Musculoskeletal:      Cervical back: Neck supple.   Lymphadenopathy:      Cervical: No cervical adenopathy.   Neurological:      Mental Status: She is alert.      Comments: Pt is in back brace.              Assessment/Plan:    1. Closed compression fracture of body of L1 vertebra  - incr gabapentin to 200 mg bid. If still in pain in 1 week, incr to 200 mg tid.  - XR Lumbar Spine 4+ Views; Future  - pt has f/u with neurosurgeon.             Follow-up:    Return in about 2 months (around 07/04/2020) for physical.         Renaldo Reel, MD

## 2020-05-06 ENCOUNTER — Observation Stay
Admission: EM | Admit: 2020-05-06 | Discharge: 2020-05-08 | Disposition: A | Discharge status: Home / Self Care | Payer: BLUE CROSS/BLUE SHIELD | Attending: Internal Medicine | Admitting: Internal Medicine

## 2020-05-06 ENCOUNTER — Observation Stay: Payer: BLUE CROSS/BLUE SHIELD

## 2020-05-06 DIAGNOSIS — Z6841 Body Mass Index (BMI) 40.0 and over, adult: Secondary | ICD-10-CM | POA: Insufficient documentation

## 2020-05-06 DIAGNOSIS — S32010A Wedge compression fracture of first lumbar vertebra, initial encounter for closed fracture: Secondary | ICD-10-CM | POA: Insufficient documentation

## 2020-05-06 DIAGNOSIS — F17210 Nicotine dependence, cigarettes, uncomplicated: Secondary | ICD-10-CM | POA: Insufficient documentation

## 2020-05-06 DIAGNOSIS — S22089A Unspecified fracture of T11-T12 vertebra, initial encounter for closed fracture: Secondary | ICD-10-CM | POA: Insufficient documentation

## 2020-05-06 DIAGNOSIS — S22079A Unspecified fracture of T9-T10 vertebra, initial encounter for closed fracture: Principal | ICD-10-CM | POA: Insufficient documentation

## 2020-05-06 DIAGNOSIS — M549 Dorsalgia, unspecified: Secondary | ICD-10-CM | POA: Diagnosis present

## 2020-05-06 DIAGNOSIS — W19XXXA Unspecified fall, initial encounter: Secondary | ICD-10-CM | POA: Insufficient documentation

## 2020-05-06 DIAGNOSIS — E669 Obesity, unspecified: Secondary | ICD-10-CM | POA: Insufficient documentation

## 2020-05-06 DIAGNOSIS — R519 Headache, unspecified: Secondary | ICD-10-CM | POA: Insufficient documentation

## 2020-05-06 DIAGNOSIS — I10 Essential (primary) hypertension: Secondary | ICD-10-CM | POA: Diagnosis present

## 2020-05-06 HISTORY — DX: Other fracture of unspecified lower leg, initial encounter for closed fracture: S82.899A

## 2020-05-06 HISTORY — DX: Personal history of other (healed) physical injury and trauma: Z87.828

## 2020-05-06 HISTORY — DX: Displaced fracture of neck of unspecified metacarpal bone, initial encounter for closed fracture: S62.339A

## 2020-05-06 HISTORY — DX: Deep endometriosis of ovary, unspecified ovary: N80.129

## 2020-05-06 MED ORDER — PROCHLORPERAZINE EDISYLATE 10 MG/2ML IJ SOLN
10.00 mg | Freq: Four times a day (QID) | INTRAMUSCULAR | Status: DC | PRN
Start: 2020-05-06 — End: 2020-05-08
  Administered 2020-05-07: 20:00:00 10 mg via INTRAVENOUS
  Filled 2020-05-06: qty 2

## 2020-05-06 MED ORDER — SODIUM CHLORIDE 0.9 % IV SOLN
12.50 mg | Freq: Once | INTRAVENOUS | Status: AC
Start: 2020-05-06 — End: 2020-05-06
  Administered 2020-05-06: 18:00:00 12.5 mg via INTRAVENOUS
  Filled 2020-05-06: qty 1

## 2020-05-06 MED ORDER — HYDROMORPHONE HCL 2 MG PO TABS
2.00 mg | ORAL_TABLET | Freq: Once | ORAL | Status: AC
Start: 2020-05-06 — End: 2020-05-06
  Administered 2020-05-06: 16:00:00 2 mg via ORAL
  Filled 2020-05-06: qty 1

## 2020-05-06 MED ORDER — ENOXAPARIN SODIUM 40 MG/0.4ML SC SOLN
40.00 mg | Freq: Every day | SUBCUTANEOUS | Status: DC
Start: 2020-05-07 — End: 2020-05-07

## 2020-05-06 MED ORDER — SODIUM CHLORIDE 0.9 % IV BOLUS
1000.00 mL | Freq: Once | INTRAVENOUS | Status: AC
Start: 2020-05-06 — End: 2020-05-06
  Administered 2020-05-06: 18:00:00 1000 mL via INTRAVENOUS

## 2020-05-06 MED ORDER — NALOXONE HCL 0.4 MG/ML IJ SOLN (WRAP)
0.20 mg | INTRAMUSCULAR | Status: DC | PRN
Start: 2020-05-06 — End: 2020-05-08

## 2020-05-06 MED ORDER — BISACODYL 5 MG PO TBEC
10.00 mg | DELAYED_RELEASE_TABLET | Freq: Every day | ORAL | Status: DC | PRN
Start: 2020-05-06 — End: 2020-05-08
  Filled 2020-05-06: qty 2

## 2020-05-06 MED ORDER — DIPHENHYDRAMINE HCL 50 MG/ML IJ SOLN
25.00 mg | Freq: Once | INTRAMUSCULAR | Status: AC
Start: 2020-05-06 — End: 2020-05-06
  Administered 2020-05-06: 18:00:00 25 mg via INTRAVENOUS
  Filled 2020-05-06: qty 1

## 2020-05-06 MED ORDER — CALCIUM CARBONATE ANTACID 500 MG PO CHEW
1000.00 mg | CHEWABLE_TABLET | Freq: Four times a day (QID) | ORAL | Status: DC | PRN
Start: 2020-05-06 — End: 2020-05-08

## 2020-05-06 MED ORDER — FENTANYL CITRATE (PF) 50 MCG/ML IJ SOLN (WRAP)
100.00 ug | Freq: Once | INTRAMUSCULAR | Status: AC
Start: 2020-05-06 — End: 2020-05-06
  Administered 2020-05-06: 20:00:00 100 ug via INTRAVENOUS
  Filled 2020-05-06: qty 2

## 2020-05-06 MED ORDER — HYDROMORPHONE HCL 0.5 MG/0.5 ML IJ SOLN
0.40 mg | INTRAMUSCULAR | Status: DC | PRN
Start: 2020-05-06 — End: 2020-05-08
  Administered 2020-05-07 (×6): 0.4 mg via INTRAVENOUS
  Filled 2020-05-06 (×6): qty 1

## 2020-05-06 MED ORDER — CYCLOBENZAPRINE HCL 10 MG PO TABS
10.0000 mg | ORAL_TABLET | Freq: Three times a day (TID) | ORAL | Status: DC | PRN
Start: 2020-05-06 — End: 2020-05-07
  Administered 2020-05-07: 02:00:00 10 mg via ORAL
  Filled 2020-05-06: qty 1

## 2020-05-06 MED ORDER — CLONAZEPAM 0.5 MG PO TABS
1.0000 mg | ORAL_TABLET | Freq: Every day | ORAL | Status: DC
Start: 2020-05-07 — End: 2020-05-08
  Administered 2020-05-07 – 2020-05-08 (×2): 1 mg via ORAL
  Filled 2020-05-06 (×2): qty 2

## 2020-05-06 MED ORDER — VENLAFAXINE HCL ER 37.5 MG PO CP24
37.50 mg | ORAL_CAPSULE | Freq: Every day | ORAL | Status: DC
Start: 2020-05-07 — End: 2020-05-08
  Administered 2020-05-07 – 2020-05-08 (×2): 37.5 mg via ORAL
  Filled 2020-05-06 (×3): qty 1

## 2020-05-06 MED ORDER — ACETAMINOPHEN 650 MG RE SUPP
650.00 mg | Freq: Four times a day (QID) | RECTAL | Status: DC | PRN
Start: 2020-05-06 — End: 2020-05-08

## 2020-05-06 MED ORDER — GABAPENTIN 100 MG PO CAPS
200.00 mg | ORAL_CAPSULE | Freq: Three times a day (TID) | ORAL | Status: DC
Start: 2020-05-06 — End: 2020-05-08
  Administered 2020-05-07 – 2020-05-08 (×5): 200 mg via ORAL
  Filled 2020-05-06 (×5): qty 2

## 2020-05-06 MED ORDER — HYDRALAZINE HCL 20 MG/ML IJ SOLN
10.00 mg | INTRAMUSCULAR | Status: DC | PRN
Start: 2020-05-06 — End: 2020-05-08

## 2020-05-06 MED ORDER — ONDANSETRON HCL 4 MG/2ML IJ SOLN
4.00 mg | Freq: Four times a day (QID) | INTRAMUSCULAR | Status: DC | PRN
Start: 2020-05-06 — End: 2020-05-08
  Administered 2020-05-07 – 2020-05-08 (×2): 4 mg via INTRAVENOUS
  Filled 2020-05-06: qty 2

## 2020-05-06 MED ORDER — KETOROLAC TROMETHAMINE 30 MG/ML IJ SOLN
15.00 mg | Freq: Four times a day (QID) | INTRAMUSCULAR | Status: DC | PRN
Start: 2020-05-06 — End: 2020-05-08
  Administered 2020-05-07 – 2020-05-08 (×3): 15 mg via INTRAVENOUS
  Filled 2020-05-06 (×3): qty 1

## 2020-05-06 MED ORDER — FENTANYL CITRATE (PF) 50 MCG/ML IJ SOLN (WRAP)
100.00 ug | Freq: Once | INTRAMUSCULAR | Status: AC
Start: 2020-05-06 — End: 2020-05-06
  Administered 2020-05-06: 100 ug via INTRAVENOUS
  Filled 2020-05-06: qty 2

## 2020-05-06 MED ORDER — KETOROLAC TROMETHAMINE 30 MG/ML IJ SOLN
15.00 mg | Freq: Once | INTRAMUSCULAR | Status: AC
Start: 2020-05-06 — End: 2020-05-06
  Administered 2020-05-06: 18:00:00 15 mg via INTRAVENOUS
  Filled 2020-05-06: qty 1

## 2020-05-06 MED ORDER — ACETAMINOPHEN 325 MG PO TABS
650.0000 mg | ORAL_TABLET | Freq: Four times a day (QID) | ORAL | Status: DC | PRN
Start: 2020-05-06 — End: 2020-05-08
  Administered 2020-05-08: 650 mg via ORAL
  Filled 2020-05-06: qty 2

## 2020-05-06 MED ORDER — HYDROMORPHONE HCL 2 MG PO TABS
4.0000 mg | ORAL_TABLET | ORAL | Status: DC | PRN
Start: 2020-05-06 — End: 2020-05-08
  Administered 2020-05-07 – 2020-05-08 (×6): 4 mg via ORAL
  Filled 2020-05-06 (×6): qty 2

## 2020-05-06 MED ORDER — BUTALBITAL-APAP-CAFFEINE 50-325-40 MG PO TABS
1.0000 | ORAL_TABLET | Freq: Four times a day (QID) | ORAL | Status: DC | PRN
Start: 2020-05-06 — End: 2020-05-08

## 2020-05-06 MED ORDER — ONDANSETRON 4 MG PO TBDP
4.00 mg | ORAL_TABLET | Freq: Four times a day (QID) | ORAL | Status: DC | PRN
Start: 2020-05-06 — End: 2020-05-08

## 2020-05-06 MED ORDER — SENNOSIDES-DOCUSATE SODIUM 8.6-50 MG PO TABS
2.0000 | ORAL_TABLET | Freq: Two times a day (BID) | ORAL | Status: DC | PRN
Start: 2020-05-06 — End: 2020-05-08

## 2020-05-06 NOTE — ED Notes (Signed)
[facility] HOSPITAL EMERGENCY DEPT  ED NURSING NOTE FOR THE RECEIVING INPATIENT NURSE   ED NURSE Cait/Christine   SPECTRALINK [id number]   ED CHARGE RN Tawanna Cooler   ADMISSION INFORMATION   Victoria Woodward is a 39 y.o. female admitted with an ED diagnosis of:    1. Closed compression fracture of body of L1 vertebra    2. Closed fracture of ninth thoracic vertebra, unspecified fracture morphology, initial encounter    3. Closed fracture of tenth thoracic vertebra, unspecified fracture morphology, initial encounter    4. Closed fracture of eleventh thoracic vertebra, unspecified fracture morphology, initial encounter         Isolation: None   Allergies: Morphine   Holding Orders confirmed? N/A   Belongings Documented? Yes   Home medications sent to pharmacy confirmed? N/A   NURSING CARE   Patient Comes From:   Mental Status: Home/Family Care  alert and oriented   ADL: Needs assistance with ADLs   Ambulation: Walks with a walker due to current back injury   Pertinent Information  and Safety Concerns: Currently in back brace and walking with a walker     CT / NIH   CT Head ordered on this patient?  N/A   NIH/Dysphagia assessment done prior to admission? N/A   VITAL SIGNS (at the time of this note)      Vitals:    05/06/20 2229   BP: (!) 172/114   Pulse: 68   Resp: 19   Temp: 98.2 F (36.8 C)   SpO2: 100%

## 2020-05-06 NOTE — ED Provider Notes (Signed)
[facility] EMERGENCY DEPARTMENT H&P      Visit date: 05/06/2020      CLINICAL SUMMARY           Diagnosis:    .     Final diagnoses:   Closed compression fracture of body of L1 vertebra   Closed fracture of ninth thoracic vertebra, unspecified fracture morphology, initial encounter   Closed fracture of tenth thoracic vertebra, unspecified fracture morphology, initial encounter   Closed fracture of eleventh thoracic vertebra, unspecified fracture morphology, initial encounter         MDM Notes:    Prior records reviewed  The patient presents with worsening back pain in the setting of known fractures of several vertebral bodies. She has been wearing the TLSO brace and denies recurrent injury. She has had no new symptoms aside from pain  Review of records show that her baseline BPs are normally in the 120-140s range and she carries no diagnosis of HTN. I believe that her elevated BP today is due to pain. Because we were unable to manage the patient's pain in the ER, patient was admitted for further eval and management.            Disposition:         Observation Admit      ED Disposition     ED Disposition Condition Date/Time Comment    Observation  Wed May 06, 2020 10:22 PM Admitting Physician: Emeline Darling [[id number]]   Service:: Neurology [[id number]]   Estimated Length of Stay: < 2 midnights   Tentative Discharge Plan?: Home or Self Care [1]   Does patient need telemetry?: Yes   Telemetry type (separate Telemetry order is also required):: Medical telemetry                        CLINICAL INFORMATION        HPI:      Chief Complaint: Hypertension  .    Victoria Woodward is a 39 y.o. female who presents with a chief complaint of hypertension.    Pt has a home health aid due to recent fall/spinal fractures. Aid checked her BP today and noticed it was high. No hx of hypertension. Recently seen by PCP where BP was normal in the office. Pt thinks BP could be elevated due to worsening back pain.  Describes pain as 8/10. Pain regimen at home not working well. Pt also notes mild headache.    Denies: weakness/issues with limbs, bowel/bladder incontinence, CP, SOB    History obtained from: Patient          ROS:    Review of Systems   Musculoskeletal: Positive for back pain.   Neurological: Positive for headaches. Negative for weakness.   All other systems reviewed and are negative.          Physical Exam:      Pulse 76   BP 132/81   Resp 16   SpO2 98 %   Temp 98.5 F (36.9 C)    Constitutional: Vital signs reviewed. Wearing back brace.  Head: Normocephalic, atraumatic  Eyes: No conjunctival injection. No discharge. No scleral icterus.  ENT: Mucous membranes moist.  Neck: Normal range of motion. Supple. Non-tender.  Respiratory/Chest: Clear to auscultation. No respiratory distress.   Cardiovascular: Regular rate and rhythm. No murmur appreciated.   Abdomen: Normal bowel sounds. Soft and non-tender. No guarding. No masses or hepatosplenomegaly.  Genitourinary: Not examined.  Upper Extremity: No edema.  No cyanosis. No gross deformities.  Lower Extremity: No edema. No cyanosis. No gross deformities.  Neurological: No focal motor deficits by observation. Speech normal. Alert and oriented.  Skin: Warm and well-perfused, dry. No rash.  Psychiatric: Normal affect. Normal concentration.            PAST HISTORY        Primary Care Provider: Renaldo Reel, MD        PMH/PSH:    .     Past Medical History:   Diagnosis Date    Ankle fracture     left    Anxiety     Boxer's fracture     right    Depression     Endometrioma     Hx of back injury        She has a past surgical history that includes Hysterectomy (2006); Cesarean section (2003); LIFT, ARM (COSMETIC) (Bilateral); ENDOMETRIOSIS SURGERY; and Vertebroplasty/Kyphoplasty (N/A, 05/08/2020).      Social/Family History:      She reports that she has been smoking cigarettes. She has never used smokeless tobacco. She reports previous alcohol use of about 1.0  standard drinks of alcohol per week. She reports that she does not use drugs.    Family History   Problem Relation Age of Onset    Dermatomyositis Mother     Diabetes Father     Heart disease Father     Hyperlipidemia Father     Dementia Father     Diabetes Sister     Heart disease Brother     Cancer Paternal Aunt     Colon cancer Maternal Grandmother          Listed Medications on Arrival:    .     Home Medications     Med List Status: Complete Set By: Elmore Guise, RN at 05/07/2020  1:57 AM                acetaminophen (TYLENOL) 325 MG tablet     Take 2 tablets (650 mg total) by mouth every 6 (six) hours as needed for Pain     clonazePAM (KlonoPIN) 1 MG tablet     Take 1 mg by mouth daily        ibuprofen (ADVIL) 800 MG tablet     Take 1 tablet (800 mg total) by mouth every 6 (six) hours as needed for Pain     venlafaxine (EFFEXOR-XR) 37.5 MG 24 hr capsule     Take 37.5 mg by mouth daily                   Allergies: She is allergic to morphine.            VISIT INFORMATION        Clinical Course in the ED:                 Medications Given in the ED:    .     ED Medication Orders (From admission, onward)    Start Ordered     Status Ordering Provider    05/07/20 0900 05/06/20 2336    Daily     Route: Oral  Ordered Dose: 1 mg     Discontinued NGUYEN, HUY D    05/07/20 0900 05/06/20 2336    Daily     Route: Oral  Ordered Dose: 37.5 mg     Discontinued NGUYEN, HUY D    05/07/20 0900 05/06/20 2336  Daily     Route: Subcutaneous  Ordered Dose: 40 mg     Discontinued NGUYEN, HUY D    05/06/20 2337 05/06/20 2336    Every 8 hours scheduled     Route: Oral  Ordered Dose: 200 mg     Discontinued NGUYEN, HUY D    05/06/20 2336 05/06/20 2336    Every 6 hours PRN     Route: Oral  Ordered Dose: 4 mg     Discontinued NGUYEN, HUY D    05/06/20 2336 05/06/20 2336    Every 6 hours PRN     Route: Intravenous  Ordered Dose: 4 mg     Discontinued NGUYEN, HUY D    05/06/20 2336 05/06/20 2336    Every 6 hours PRN      Route: Oral  Ordered Dose: 650 mg     Discontinued NGUYEN, HUY D    05/06/20 2336 05/06/20 2336    Every 6 hours PRN     Route: Rectal  Ordered Dose: 650 mg     Discontinued NGUYEN, HUY D    05/06/20 2336 05/06/20 2336    2 times daily PRN     Route: Oral  Ordered Dose: 2 tablet     Discontinued NGUYEN, HUY D    05/06/20 2336 05/06/20 2336    Every 6 hours PRN     Route: Intravenous  Ordered Dose: 10 mg     Discontinued NGUYEN, HUY D    05/06/20 2336 05/06/20 2336    Every 6 hours PRN     Route: Oral  Ordered Dose: 1,000 mg     Discontinued NGUYEN, HUY D    05/06/20 2336 05/06/20 2336    Daily PRN     Route: Oral  Ordered Dose: 10 mg     Discontinued NGUYEN, HUY D    05/06/20 2336 05/06/20 2336    Every 4 hours PRN     Route: Intravenous  Ordered Dose: 0.4 mg     Discontinued NGUYEN, HUY D    05/06/20 2336 05/06/20 2336    3 times daily PRN     Route: Oral  Ordered Dose: 10 mg     Discontinued NGUYEN, HUY D    05/06/20 2336 05/06/20 2336    As needed     Route: Intravenous  Ordered Dose: 0.2 mg     Discontinued NGUYEN, HUY D    05/06/20 2336 05/06/20 2336    Every 4 hours PRN     Route: Oral  Ordered Dose: 4 mg     Discontinued NGUYEN, HUY D    05/06/20 2336 05/06/20 2336    Every 6 hours PRN     Route: Intravenous  Ordered Dose: 15 mg     Discontinued NGUYEN, HUY D    05/06/20 2336 05/06/20 2336    Every 2 hours PRN     Route: Intravenous  Ordered Dose: 10 mg     Discontinued NGUYEN, HUY D    05/06/20 2336 05/06/20 2336    Every 6 hours PRN     Route: Oral  Ordered Dose: 1 tablet     Discontinued NGUYEN, HUY D    05/06/20 2334 05/06/20 2333  fentaNYL (PF) (SUBLIMAZE) injection 100 mcg  Once     Route: Intravenous  Ordered Dose: 100 mcg     Last MAR action: Given Erynn Vaca A    05/06/20 1952 05/06/20 1951  fentaNYL (PF) (SUBLIMAZE) injection 100 mcg  Once  Route: Intravenous  Ordered Dose: 100 mcg     Last MAR action: Given Tali Coster A    05/06/20 1744 05/06/20 1743  sodium chloride 0.9 % bolus 1,000  mL  Once     Route: Intravenous  Ordered Dose: 1,000 mL     Last MAR action: New Bag Davari Lopes A    05/06/20 1744 05/06/20 1743  ketorolac (TORADOL) injection 15 mg  Once     Route: Intravenous  Ordered Dose: 15 mg     Last MAR action: Given Lerlene Treadwell A    05/06/20 1744 05/06/20 1743  diphenhydrAMINE (BENADRYL) injection 25 mg  Once     Route: Intravenous  Ordered Dose: 25 mg     Last MAR action: Given Guillermina Shaft A    05/06/20 1744 05/06/20 1743  promethazine (PHENERGAN) 12.5 mg in sodium chloride 0.9 % 50 mL IVPB  Once     Route: Intravenous  Ordered Dose: 12.5 mg     Last MAR action: Given Lynna Zamorano A    05/06/20 1618 05/06/20 1617  HYDROmorphone (DILAUDID) tablet 2 mg  Once     Route: Oral  Ordered Dose: 2 mg     Last MAR action: Given Kymani Laursen A            Procedures:      Procedures      Interpretations:                     RESULTS        Lab Results:      Results     ** No results found for the last 24 hours. **              Radiology Results:      Vertebroplasty/Kyphoplasty   Final Result      L1 vertebroplasty as above.      END OF IMPRESSION.      [name]    05/08/2020 4:00 PM      CT Thoracic Spine WO Contrast   Final Result    CT examination of the thoracic and lumbar spine again shows   a subacute fracture of the L1 vertebral body with increasing loss of   height. No definite acute fractures are seen. The examination shows no   definite injuries in the thoracic spine. The previously described subtle   injuries of the lower thoracic vertebrae are no longer appreciated.         [name], MD    05/07/2020 2:25 AM      CT Lumbar Spine WO Contrast   Final Result    CT examination of the thoracic and lumbar spine again shows   a subacute fracture of the L1 vertebral body with increasing loss of   height. No definite acute fractures are seen. The examination shows no   definite injuries in the thoracic spine. The previously described subtle   injuries of the lower thoracic  vertebrae are no longer appreciated.         [name], MD    05/07/2020 2:25 AM                  Scribe Attestation:      I was acting as a Neurosurgeon for Britt Boozer, MD on [employer], [place]    I am the first provider for this patient and I personally performed the services documented. Maaghul, Charlynne Pander  is scribing for  me on [place] RENEE. This note and the patient instructions accurately reflect work and decisions made by me.  Marnell Mcdaniel, Reita Chard, MD            Britt Boozer, MD  05/12/20 [phone]

## 2020-05-06 NOTE — H&P (Signed)
[facility] HOSPITALIST ADMISSION HISTORY AND PHYSICAL EXAM    Date Time: 05/07/20 1:17 AM  Patient Name: Victoria Woodward  Attending Physician: Gildardo Griffes, DO  Primary Care Physician: Renaldo Reel, MD    CC: hypertension, intractable back pain, headache      History of Presenting Illness:   Victoria Woodward is a 39 y.o. female hx endometriosis s/p removal and partial hysterectomy, depression, anxiety, admit 6/2-6/4 for fall and L1 fracture + ?small T9-T11 fractures s/p TLSO brace who presents to the hospital with hypertension, intractable back pain. She was discharged on oxycodone, gabapentin, cyclobenzaprine, ibuprofen, and clonazepam. She continued to have pain. She saw her PMD who increased her gabapentin. Today she was sent in by her home aid due to high blood pressure. Peak ER BP was 187/118. She stated uncontrolled back pain. She denies new leg weakness and has been using her TLSO brace and was able to return back to work. She states that in the past Percocet, Dilaudid, and fentanyl have been effective for pain. She also had a migraine in the ER. In the ER she was given PO Dilaudid, Toradol, IV fentanyl, Benadryl, Phenergan but still had uncontrolled pain. These symptoms are sudden onset, moderate intensity, without alleviating factors.    Past Medical History:     Past Medical History:   Diagnosis Date    Ankle fracture     left    Anxiety     Boxer's fracture     right    Depression     Endometrioma     Hx of back injury        Past Surgical History:     Past Surgical History:   Procedure Laterality Date    CESAREAN SECTION  2003    ENDOMETRIOSIS SURGERY      had endometrioma removed from abdominal wall with mesh placement    HYSTERECTOMY  2006    LIFT, ARM (COSMETIC) Bilateral     Breast       Family History:     Family History   Problem Relation Age of Onset    Dermatomyositis Mother     Diabetes Father     Heart disease Father     Hyperlipidemia Father     Dementia Father      Diabetes Sister     Heart disease Brother     Cancer Paternal Aunt     Colon cancer Maternal Grandmother        Social History:     Social History     Socioeconomic History    Marital status: Widowed     Spouse name: Not on file    Number of children: Not on file    Years of education: Not on file    Highest education level: Not on file   Occupational History    Occupation: Firefighter   Tobacco Use    Smoking status: Current Every Day Smoker     Types: Cigarettes    Smokeless tobacco: Never Used   Vaping Use    Vaping Use: Never used   Substance and Sexual Activity    Alcohol use: Not Currently     Alcohol/week: 1.0 standard drinks     Types: 1 Standard drinks or equivalent per week    Drug use: Never    Sexual activity: Yes   Other Topics Concern    Not on file   Social History Narrative    Caffeine- none, Nutrition- Keto, Exercise, not currently, Medical POA-  No     Social Determinants of Psychologist, prison and probation services Strain:     Difficulty of Paying Living Expenses:    Food Insecurity:     Worried About Programme researcher, broadcasting/film/video in the Last Year:     Barista in the Last Year:    Transportation Needs:     Freight forwarder (Medical):     Lack of Transportation (Non-Medical):    Physical Activity:     Days of Exercise per Week:     Minutes of Exercise per Session:    Stress:     Feeling of Stress :    Social Connections:     Frequency of Communication with Friends and Family:     Frequency of Social Gatherings with Friends and Family:     Attends Religious Services:     Active Member of Clubs or Organizations:     Attends Banker Meetings:     Marital Status:    Intimate Partner Violence:     Fear of Current or Ex-Partner:     Emotionally Abused:     Physically Abused:     Sexually Abused:        Allergies:     Allergies   Allergen Reactions    Morphine Itching       Medications:     Prior to Admission medications    Medication Sig Start Date End Date Taking?  Authorizing Provider   acetaminophen (TYLENOL) 325 MG tablet Take 2 tablets (650 mg total) by mouth every 6 (six) hours as needed for Pain 04/17/20   Turissini, Jeanelle Malling, MD   clonazePAM (KlonoPIN) 1 MG tablet Take 1 mg by mouth daily       [provider]   gabapentin (NEURONTIN) 100 MG capsule Take 2 capsules (200 mg total) by mouth every 8 (eight) hours 04/17/20   Lavinia Sharps, MD   ibuprofen (ADVIL) 800 MG tablet Take 1 tablet (800 mg total) by mouth every 6 (six) hours as needed for Pain 04/27/20   Antonietta Barcelona, FNP   venlafaxine (EFFEXOR-XR) 37.5 MG 24 hr capsule Take 37.5 mg by mouth daily    [provider]       Review of Systems:   All other systems were reviewed and are negative except: as above    Physical Exam:     Vitals:    05/07/20 0328   BP: (!) 165/98   Pulse:    Resp:    Temp:    SpO2:        Intake and Output Summary (Last 24 hours) at Date Time  No intake or output data in the 24 hours ending 05/07/20 0647    General: awake, alert, oriented x 3; mild distress.  HEENT: perrla, eomi, sclera anicteric  oropharynx clear without lesions, mucous membranes moist  Neck: supple, no lymphadenopathy, no thyromegaly, no JVD, no carotid bruits  Cardiovascular: regular rate and rhythm, no murmurs, rubs or gallops  Lungs: clear to auscultation bilaterally, without wheezing, rhonchi, or rales  Abdomen: soft, non-tender, non-distended; no palpable masses, no hepatosplenomegaly, normoactive bowel sounds, no rebound or guarding  Extremities: no clubbing, cyanosis, or edema  Neuro: cranial nerves grossly intact, strength 5/5 in upper and lower extremities except bilateral knee extension 3/5 (limited by pain), sensation intact, follows commands  Skin: no rashes or lesions noted      Labs:     Results  Procedure Component Value Units Date/Time    Comprehensive metabolic panel [[id number]]  (Abnormal) Collected: 05/07/20 0326    Specimen: Blood Updated: 05/07/20 0404     Glucose 107 mg/dL      BUN  40.9 mg/dL      Creatinine 0.9 mg/dL      Sodium 811 mEq/L      Potassium 3.6 mEq/L      Chloride 105 mEq/L      CO2 25 mEq/L      Calcium 8.6 mg/dL      Protein, Total 6.7 g/dL      Albumin 3.6 g/dL      AST (SGOT) 14 U/L      ALT 14 U/L      Alkaline Phosphatase 90 U/L      Bilirubin, Total 0.6 mg/dL      Globulin 3.1 g/dL      Albumin/Globulin Ratio 1.2     Anion Gap 7.0    GFR [[id number]] Collected: 05/07/20 0326     Updated: 05/07/20 0404     EGFR >60.0    APTT [[id number]] Collected: 05/07/20 0326     Updated: 05/07/20 0353     PTT 34 sec     Prothrombin time/INR [[id number]] Collected: 05/07/20 0326    Specimen: Blood Updated: 05/07/20 0353     PT 12.3 sec      PT INR 1.1    CBC and differential [[id number]] Collected: 05/07/20 0326    Specimen: Blood Updated: 05/07/20 0350     WBC 5.56 x10 3/uL      Hgb 12.5 g/dL      Hematocrit 27.2 %      Platelets 234 x10 3/uL      RBC 4.21 x10 6/uL      MCV 90.5 fL      MCH 29.7 pg      MCHC 32.8 g/dL      RDW 13 %      MPV 11.0 fL      Neutrophils 25.2 %      Lymphocytes Automated 56.3 %      Monocytes 11.7 %      Eosinophils Automated 5.9 %      Basophils Automated 0.7 %      Immature Granulocytes 0.2 %      Nucleated RBC 0.0 /100 WBC      Neutrophils Absolute 1.40 x10 3/uL      Lymphocytes Absolute Automated 3.13 x10 3/uL      Monocytes Absolute Automated 0.65 x10 3/uL      Eosinophils Absolute Automated 0.33 x10 3/uL      Basophils Absolute Automated 0.04 x10 3/uL      Immature Granulocytes Absolute 0.01 x10 3/uL      Absolute NRBC 0.00 x10 3/uL           Radiology Results (24 Hour)     Procedure Component Value Units Date/Time    CT Lumbar Spine WO Contrast [[id number]] Collected: 05/07/20 0109    Order Status: Completed Updated: 05/07/20 0227    Narrative:      HISTORY: intractable low back pain, prior L1 and suspected T9-T11  fractures on CT 31/27. 39 year old female with suspected lower thoracic  vertebral injuries on recent study. Back pain.    COMPARISON: CT thoracic  and lumbar spine 04/15/2020.    CT THORACIC SPINE WO CONTRAST, CT LUMBAR SPINE WO CONTRAST    TECHNIQUE: CT of the thoracic and lumbar spine was performed: Axial  spiral imaging  of the spine was performed. Sagittal and coronal MPR  images of the spine were created from the axial  data set of the body CT  study. The following dose reduction techniques were utilized: automated  exposure control and/or adjustment of the mA and/or kV according to  patient's size, and the use of iterative reconstruction technique.    FINDINGS: Worsening subacute compression fracture of the L1 vertebral  body is again seen now with about 25% loss of height. The previously  described subtle injuries of T9, T10 and T11 vertebral bodies are not  identified on today's study. No other definite compression fractures are  seen. No new fractures are seen. The injury at L1 does not involve the  posterior elements. The lumbar spine shows no other definite acute  injury. The thoracic spine shows no apparent injury.     The paraspinal soft tissues are unremarkable. The paraspinal soft  tissues show no hemorrhage. Spinal alignment is normal. There is no  evidence of a jumped facet. The curvature is within normal limits.    No significant spinal degenerative changes are identified. The disc  spaces are preserved. The spinal canal does not appear stenotic. There  is no evidence of disc widening or endplate erosions.    The sacroiliac joints are unremarkable. No neoplastic appearing bone  lesions are identified. Overall bone mineralization is normal.      Impression:       CT examination of the thoracic and lumbar spine again shows  a subacute fracture of the L1 vertebral body with increasing loss of  height. No definite acute fractures are seen. The examination shows no  definite injuries in the thoracic spine. The previously described subtle  injuries of the lower thoracic vertebrae are no longer appreciated.      [name], MD   05/07/2020 2:25 AM    CT  Thoracic Spine WO Contrast [[id number]] Collected: 05/07/20 0109    Order Status: Completed Updated: 05/07/20 0227    Narrative:      HISTORY: intractable low back pain, prior L1 and suspected T9-T11  fractures on CT 41/53. 39 year old female with suspected lower thoracic  vertebral injuries on recent study. Back pain.    COMPARISON: CT thoracic and lumbar spine 04/15/2020.    CT THORACIC SPINE WO CONTRAST, CT LUMBAR SPINE WO CONTRAST    TECHNIQUE: CT of the thoracic and lumbar spine was performed: Axial  spiral imaging of the spine was performed. Sagittal and coronal MPR  images of the spine were created from the axial  data set of the body CT  study. The following dose reduction techniques were utilized: automated  exposure control and/or adjustment of the mA and/or kV according to  patient's size, and the use of iterative reconstruction technique.    FINDINGS: Worsening subacute compression fracture of the L1 vertebral  body is again seen now with about 25% loss of height. The previously  described subtle injuries of T9, T10 and T11 vertebral bodies are not  identified on today's study. No other definite compression fractures are  seen. No new fractures are seen. The injury at L1 does not involve the  posterior elements. The lumbar spine shows no other definite acute  injury. The thoracic spine shows no apparent injury.     The paraspinal soft tissues are unremarkable. The paraspinal soft  tissues show no hemorrhage. Spinal alignment is normal. There is no  evidence of a jumped facet. The curvature is within normal limits.  No significant spinal degenerative changes are identified. The disc  spaces are preserved. The spinal canal does not appear stenotic. There  is no evidence of disc widening or endplate erosions.    The sacroiliac joints are unremarkable. No neoplastic appearing bone  lesions are identified. Overall bone mineralization is normal.      Impression:       CT examination of the thoracic and lumbar  spine again shows  a subacute fracture of the L1 vertebral body with increasing loss of  height. No definite acute fractures are seen. The examination shows no  definite injuries in the thoracic spine. The previously described subtle  injuries of the lower thoracic vertebrae are no longer appreciated.      [name], MD   05/07/2020 2:25 AM            Assessment:     Patient Active Problem List   Diagnosis    Closed wedge compression fracture of L1 vertebra, initial encounter    Pain, acute due to trauma    Fall    Obese    Adjustment reaction with anxiety and depression    Closed compression fracture of body of L1 vertebra       39 y.o. female hx endometriosis s/p removal and partial hysterectomy, depression, anxiety, admit 6/2-6/4 for fall and L1 fracture + ?small T9-T11 fractures s/p TLSO brace who presents with hypertension, intractable back pain, headache.    Plan:   Intractable back pain - Caution with IV opiates but will adjust medications to achieve pain control. CT thoracic and lumbar spine. PT. Neuro checks. Consider vertebroplasty if unable to control pain.     Headache - History of migraines. Analgesia.     HTN - Denies history of HTN. Hydralazine prn SBP>180. Pain could be increasing blood pressure. Start amlodipine 5mg  daily.     Hx endometriosis s/p removal and partial hysterectomy, depression, anxiety, admit 6/2-6/4 for fall and L1 fracture + ?small T9-T11 fractures s/p TLSO brace - Noted.    DVT/GI prophylaxis - Lovenox, SCDs.     Status/Rationale: Observation spine ward.     Signed by: Emeline Darling, MD   ZO:[id number], Nita Sells, MD

## 2020-05-07 ENCOUNTER — Encounter: Payer: Self-pay | Admitting: Hospitalist

## 2020-05-07 DIAGNOSIS — R519 Headache, unspecified: Secondary | ICD-10-CM

## 2020-05-07 DIAGNOSIS — I10 Essential (primary) hypertension: Secondary | ICD-10-CM | POA: Diagnosis present

## 2020-05-07 DIAGNOSIS — Z9889 Other specified postprocedural states: Secondary | ICD-10-CM

## 2020-05-07 DIAGNOSIS — M549 Dorsalgia, unspecified: Secondary | ICD-10-CM

## 2020-05-07 DIAGNOSIS — S32010A Wedge compression fracture of first lumbar vertebra, initial encounter for closed fracture: Secondary | ICD-10-CM

## 2020-05-07 LAB — COMPREHENSIVE METABOLIC PANEL
ALT: 14 U/L (ref 0–55)
AST (SGOT): 14 U/L (ref 5–34)
Albumin/Globulin Ratio: 1.2 (ref 0.9–2.2)
Albumin: 3.6 g/dL (ref 3.5–5.0)
Alkaline Phosphatase: 90 U/L (ref 37–106)
Anion Gap: 7 (ref 5.0–15.0)
BUN: 13 mg/dL (ref 7.0–19.0)
Bilirubin, Total: 0.6 mg/dL (ref 0.2–1.2)
CO2: 25 mEq/L (ref 22–29)
Calcium: 8.6 mg/dL (ref 8.5–10.5)
Chloride: 105 mEq/L (ref 100–111)
Creatinine: 0.9 mg/dL (ref 0.6–1.0)
Globulin: 3.1 g/dL (ref 2.0–3.6)
Glucose: 107 mg/dL — ABNORMAL HIGH (ref 70–100)
Potassium: 3.6 mEq/L (ref 3.5–5.1)
Protein, Total: 6.7 g/dL (ref 6.0–8.3)
Sodium: 137 mEq/L (ref 136–145)

## 2020-05-07 LAB — CBC AND DIFFERENTIAL
Absolute NRBC: 0 10*3/uL (ref 0.00–0.00)
Basophils Absolute Automated: 0.04 10*3/uL (ref 0.00–0.08)
Basophils Automated: 0.7 %
Eosinophils Absolute Automated: 0.33 10*3/uL (ref 0.00–0.44)
Eosinophils Automated: 5.9 %
Hematocrit: 38.1 % (ref 34.7–43.7)
Hgb: 12.5 g/dL (ref 11.4–14.8)
Immature Granulocytes Absolute: 0.01 10*3/uL (ref 0.00–0.07)
Immature Granulocytes: 0.2 %
Lymphocytes Absolute Automated: 3.13 10*3/uL (ref 0.42–3.22)
Lymphocytes Automated: 56.3 %
MCH: 29.7 pg (ref 25.1–33.5)
MCHC: 32.8 g/dL (ref 31.5–35.8)
MCV: 90.5 fL (ref 78.0–96.0)
MPV: 11 fL (ref 8.9–12.5)
Monocytes Absolute Automated: 0.65 10*3/uL (ref 0.21–0.85)
Monocytes: 11.7 %
Neutrophils Absolute: 1.4 10*3/uL (ref 1.10–6.33)
Neutrophils: 25.2 %
Nucleated RBC: 0 /100 WBC (ref 0.0–0.0)
Platelets: 234 10*3/uL (ref 142–346)
RBC: 4.21 10*6/uL (ref 3.90–5.10)
RDW: 13 % (ref 11–15)
WBC: 5.56 10*3/uL (ref 3.10–9.50)

## 2020-05-07 LAB — ECG 12-LEAD
Atrial Rate: 69 {beats}/min
P Axis: 43 degrees
P-R Interval: 184 ms
Q-T Interval: 416 ms
QRS Duration: 98 ms
QTC Calculation (Bezet): 445 ms
R Axis: 48 degrees
T Axis: 18 degrees
Ventricular Rate: 69 {beats}/min

## 2020-05-07 LAB — PT/INR
PT INR: 1.1 (ref 0.9–1.1)
PT: 12.3 s (ref 10.1–12.9)

## 2020-05-07 LAB — APTT: PTT: 34 s (ref 27–39)

## 2020-05-07 LAB — GFR: EGFR: >60

## 2020-05-07 MED ORDER — AMLODIPINE BESYLATE 5 MG PO TABS
5.0000 mg | ORAL_TABLET | Freq: Every day | ORAL | Status: DC
Start: 2020-05-07 — End: 2020-05-08
  Administered 2020-05-07: 08:00:00 5 mg via ORAL
  Filled 2020-05-07: qty 1

## 2020-05-07 MED ORDER — ENOXAPARIN SODIUM 60 MG/0.6ML SC SOLN
0.50 mg/kg | SUBCUTANEOUS | Status: DC
Start: 2020-05-07 — End: 2020-05-08
  Administered 2020-05-07: 08:00:00 50 mg via SUBCUTANEOUS
  Filled 2020-05-07 (×3): qty 0.6

## 2020-05-07 MED ORDER — METHOCARBAMOL 500 MG PO TABS
1000.0000 mg | ORAL_TABLET | Freq: Three times a day (TID) | ORAL | Status: DC | PRN
Start: 2020-05-07 — End: 2020-05-08
  Administered 2020-05-08: 12:00:00 1000 mg via ORAL
  Filled 2020-05-07 (×4): qty 2

## 2020-05-07 NOTE — Consults (Signed)
INTERVENTIONAL NEURORADIOLOGY CONSULTATION NOTE      Name:  Victoria Woodward, Victoria Woodward    MR#:    [id number]   DOB:  [birth year]     Hospital Day:  0   Attending:  Gildardo Griffes, DO     Date/Time: 05/07/20 1:25 PM  ______________________________________________________________________    Diagnosis/Procedure(s):  L1 compression fracture    Assessment/Plan:  Acute L1 compression fracture.   Severe pain is not improving with medical management.  Recommend L1 kyphoplasty or vertebroplasty procedure.  Patient understands risks and benefits and wants procedure done.  Will schedule for tomorrow 6/25 with Dr. Jimmie Molly.    Active Problems:  Active Hospital Problems    Diagnosis    Intractable back pain    Headache    Hypertension    Closed compression fracture of body of L1 vertebra     ______________________________________________________________________    Advanced Directive:   full code    Chief Complaint:  Severe low back pain    History of Present Illness:  Acute severe low back pain after fall 3 week ago.  CT lumbar spine shows progressive compression fracture of L1, acute.  Suspected thoracic fractures were not seen on most recent CT.  Medical management and bracing have not resulted in significant pain relief.    Past Medical/Surgical History:  Past Medical History:   Diagnosis Date    Ankle fracture     left    Anxiety     Boxer's fracture     right    Depression     Endometrioma     Hx of back injury       Past Surgical History:   Procedure Laterality Date    CESAREAN SECTION  2003    ENDOMETRIOSIS SURGERY      had endometrioma removed from abdominal wall with mesh placement    HYSTERECTOMY  2006    LIFT, ARM (COSMETIC) Bilateral     Breast        Family History:  Family History   Problem Relation Age of Onset    Dermatomyositis Mother     Diabetes Father     Heart disease Father     Hyperlipidemia Father     Dementia Father     Diabetes Sister     Heart disease Brother     Cancer Paternal Aunt      Colon cancer Maternal Grandmother        Allergies:  Allergies   Allergen Reactions    Morphine Itching       Home Medications:  Prior to Admission medications    Medication Sig Start Date End Date Taking? Authorizing Provider   clonazePAM (KlonoPIN) 1 MG tablet Take 1 mg by mouth daily      Yes [provider]   gabapentin (NEURONTIN) 100 MG capsule Take 2 capsules (200 mg total) by mouth every 8 (eight) hours 04/17/20  Yes Turissini, Jeanelle Malling, MD   ibuprofen (ADVIL) 800 MG tablet Take 1 tablet (800 mg total) by mouth every 6 (six) hours as needed for Pain 04/27/20  Yes Zaiter, Rhona Leavens, FNP   venlafaxine (EFFEXOR-XR) 37.5 MG 24 hr capsule Take 37.5 mg by mouth daily   Yes [provider]   acetaminophen (TYLENOL) 325 MG tablet Take 2 tablets (650 mg total) by mouth every 6 (six) hours as needed for Pain 04/17/20   Lavinia Sharps, MD       Social History:   Social History     Socioeconomic  History    Marital status: Widowed     Spouse name: None    Number of children: None    Years of education: None    Highest education level: None   Occupational History    Occupation: Firefighter   Tobacco Use    Smoking status: Current Every Day Smoker     Types: Cigarettes    Smokeless tobacco: Never Used   Haematologist Use: Never used   Substance and Sexual Activity    Alcohol use: Not Currently     Alcohol/week: 1.0 standard drinks     Types: 1 Standard drinks or equivalent per week    Drug use: Never    Sexual activity: Yes   Other Topics Concern    None   Social History Narrative    Caffeine- none, Nutrition- Keto, Exercise, not currently, Medical POA- No     Social Determinants of Psychologist, prison and probation services Strain:     Difficulty of Paying Living Expenses:    Food Insecurity:     Worried About Programme researcher, broadcasting/film/video in the Last Year:     Barista in the Last Year:    Transportation Needs:     Freight forwarder (Medical):     Lack of Transportation (Non-Medical):    Physical  Activity:     Days of Exercise per Week:     Minutes of Exercise per Session:    Stress:     Feeling of Stress :    Social Connections:     Frequency of Communication with Friends and Family:     Frequency of Social Gatherings with Friends and Family:     Attends Religious Services:     Active Member of Clubs or Organizations:     Attends Engineer, structural:     Marital Status:    Intimate Partner Violence:     Fear of Current or Ex-Partner:     Emotionally Abused:     Physically Abused:     Sexually Abused:         Review of Systems:  10 point review of systems was performed - pertinent positives and negatives are as per the HPI.  Otherwise, all other systems are negative     Physical Exam:  Vitals:    05/07/20 1200   BP: 142/84   Pulse: 75   Resp: 18   Temp: 97.8 F (36.6 C)   SpO2: 96%      General:  Pleasant and in mild pain distress.  CV: Regular rate and rhythm  Lungs: Clear  Abdomen: soft, nondistended, good bowel sounds  Musculoskeletal: point tenderness at lower back region  Skin: Normal - no swelling or discoloration  Neuro:     Mental Status: Awake and alert     Orientation: Oriented to Name/Place/Year     Cranial Nerves: Not tested     Motor: 5/5 throughout     Sensory: Intact     Reflexes: Normal    Radiological Studies:  CT LS shows L1 compression fx.    Laboratory Results:  Normal PT and INR  ______________________________________________________________________    Signed by: Emeline Gins, MD  Interventional Neuroradiology

## 2020-05-07 NOTE — PT Eval Note (Signed)
[facility]   Physical Therapy Cancellation Note      Patient:  Victoria Woodward MRN#:  [id number]  Unit:  [facility] ORIG BLDG 5 EAST Room/Bed:  FO567/FO567.01    05/07/2020  Time: 0902      Pt not seen for physical therapy secondary to pt with imaging showing fracture progression despite brace wearing.  Will await further clarification of activity orders.  Nursing aware.    Riccardo Dubin, MSPT, pager # [phone], 05/07/2020, 9:03 AM

## 2020-05-07 NOTE — UM Notes (Signed)
Admit Date: 05/06/20  Status: Observation  Unit Location: Medsurg    HPI:   39 y.o female with complaints of intractable back pain, headache, and has hypertension found to have:  Closed compression fracture of body of L1 vertebra   Closed fracture of ninth thoracic vertebra, unspecified fracture morphology, initial encounter   Closed fracture of tenth thoracic vertebra, unspecified fracture morphology, initial encounter   Closed fracture of eleventh thoracic vertebra, unspecified fracture morphology, initial encounter     VS: 98.5, 76, 11-60, 187/118, 94%    IMAGING:   CT Lumbar and thoracic spine - a subacute fracture of the L1 vertebral body with increasing loss of height.    MEDS:   IV benadryl once  IV fentanyl x2  IV dilaudid x1  IV toradol x1  NaCL IV bolus once    PLAN:   CT thoracic and lumbar spine.   PT  Neuro checks   Consider vertebroplasty if unable to control pain  Blood pressure management        Osa Craver, RN   Utilization Review Nurse (PRN)  [facility]  Main Office: [phone]  Main fax: [phone]

## 2020-05-07 NOTE — Plan of Care (Signed)
Problem: Safety  Goal: Patient will be free from injury during hospitalization  Outcome: Progressing  Flowsheets (Taken 05/07/2020 [phone])  Patient will be free from injury during hospitalization:   Assess patient's risk for falls and implement fall prevention plan of care per policy   Provide and maintain safe environment   Use appropriate transfer methods   Hourly rounding   Ensure appropriate safety devices are available at the bedside   Include patient/ family/ care giver in decisions related to safety  Goal: Patient will be free from infection during hospitalization  Outcome: Progressing  Flowsheets (Taken 05/07/2020 0412)  Free from Infection during hospitalization:   Assess and monitor for signs and symptoms of infection   Monitor lab/diagnostic results   Encourage patient and family to use good hand hygiene technique     Problem: Pain  Goal: Pain at adequate level as identified by patient  Outcome: Progressing  Flowsheets (Taken 05/07/2020 0412)  Pain at adequate level as identified by patient:   Identify patient comfort function goal   Assess for risk of opioid induced respiratory depression, including snoring/sleep apnea. Alert healthcare team of risk factors identified.   Assess pain on admission, during daily assessment and/or before any "as needed" intervention(s)   Reassess pain within 30-60 minutes of any procedure/intervention, per Pain Assessment, Intervention, Reassessment (AIR) Cycle   Evaluate if patient comfort function goal is met   Offer non-pharmacological pain management interventions   Include patient/patient care companion in decisions related to pain management as needed     Problem: Side Effects from Pain Analgesia  Goal: Patient will experience minimal side effects of analgesic therapy  Outcome: Progressing  Flowsheets (Taken 05/07/2020 0412)  Patient will experience minimal side effects of analgesic therapy:   Monitor/assess patient's respiratory status (RR depth, effort, breath sounds)    Assess for changes in cognitive function   Prevent/manage side effects per LIP orders (i.e. nausea, vomiting, pruritus, constipation, urinary retention, etc.)     Problem: Discharge Barriers  Goal: Patient will be discharged home or other facility with appropriate resources  Outcome: Progressing  Flowsheets (Taken 05/07/2020 0412)  Discharge to home or other facility with appropriate resources:   Provide appropriate patient education   Initiate discharge planning     Problem: Psychosocial and Spiritual Needs  Goal: Demonstrates ability to cope with hospitalization/illness  Outcome: Progressing  Flowsheets (Taken 05/07/2020 0412)  Demonstrates ability to cope with hospitalizations/illness:   Encourage verbalization of feelings/concerns/expectations   Provide quiet environment   Encourage patient to set small goals for self   Encourage participation in diversional activity   Include patient/ patient care companion in decisions     Problem: Moderate/High Fall Risk Score >5  Goal: Patient will remain free of falls  Outcome: Progressing  Flowsheets (Taken 05/07/2020 0217)  High (Greater than 13):   HIGH-Bed alarm on at all times while patient in bed   HIGH-Utilize chair pad alarm for patient while in the chair   HIGH-Pharmacy to initiate evaluation and intervention per protocol   HIGH-Initiate use of floor mats as appropriate

## 2020-05-07 NOTE — Progress Notes (Signed)
05/07/20 1302   CM Review   Acknowledgment of Outpatient/Observation Observation letter given

## 2020-05-08 ENCOUNTER — Encounter
Admission: EM | Disposition: A | Discharge status: Home / Self Care | Payer: Self-pay | Source: Home / Self Care | Attending: Emergency Medicine

## 2020-05-08 ENCOUNTER — Observation Stay: Payer: BLUE CROSS/BLUE SHIELD | Admitting: Anesthesiology

## 2020-05-08 ENCOUNTER — Encounter: Payer: Self-pay | Admitting: Diagnostic Radiology

## 2020-05-08 HISTORY — PX: VERTEBROPLASTY/KYPHOPLASTY: IMG2684

## 2020-05-08 SURGERY — VERTEBROPLASTY/KYPHOPLASTY
Anesthesia: Anesthesia General

## 2020-05-08 MED ORDER — ONDANSETRON HCL 4 MG/2ML IJ SOLN
INTRAMUSCULAR | Status: AC
Start: 2020-05-08 — End: ?
  Filled 2020-05-08: qty 2

## 2020-05-08 MED ORDER — AMLODIPINE BESYLATE 10 MG PO TABS
10.0000 mg | ORAL_TABLET | Freq: Every day | ORAL | 0 refills | Status: DC
Start: 2020-05-08 — End: 2020-07-06

## 2020-05-08 MED ORDER — FENTANYL CITRATE (PF) 50 MCG/ML IJ SOLN (WRAP)
25.0000 ug | INTRAMUSCULAR | Status: AC | PRN
Start: 2020-05-08 — End: 2020-05-08
  Administered 2020-05-08 (×4): 25 ug via INTRAVENOUS

## 2020-05-08 MED ORDER — FENTANYL CITRATE (PF) 50 MCG/ML IJ SOLN (WRAP)
INTRAMUSCULAR | Status: AC
Start: 2020-05-08 — End: ?
  Filled 2020-05-08: qty 2

## 2020-05-08 MED ORDER — BUPIVACAINE HCL (PF) 0.5 % IJ SOLN
INTRAMUSCULAR | Status: AC
Start: 2020-05-08 — End: ?
  Filled 2020-05-08: qty 10

## 2020-05-08 MED ORDER — SODIUM CHLORIDE 0.9 % IV SOLN
INTRAVENOUS | Status: DC | PRN
Start: 2020-05-08 — End: 2020-05-08

## 2020-05-08 MED ORDER — MIDAZOLAM HCL 1 MG/ML IJ SOLN (WRAP)
INTRAMUSCULAR | Status: DC | PRN
Start: 2020-05-08 — End: 2020-05-08
  Administered 2020-05-08: 2 mg via INTRAVENOUS

## 2020-05-08 MED ORDER — MIDAZOLAM HCL 1 MG/ML IJ SOLN (WRAP)
INTRAMUSCULAR | Status: AC
Start: 2020-05-08 — End: ?
  Filled 2020-05-08: qty 2

## 2020-05-08 MED ORDER — ETOMIDATE 2 MG/ML IV SOLN
INTRAVENOUS | Status: AC
Start: 2020-05-08 — End: ?
  Filled 2020-05-08: qty 20

## 2020-05-08 MED ORDER — HYDROMORPHONE HCL 0.5 MG/0.5 ML IJ SOLN
0.5000 mg | INTRAMUSCULAR | Status: DC | PRN
Start: 2020-05-08 — End: 2020-05-08
  Administered 2020-05-08: 0.5 mg via INTRAVENOUS

## 2020-05-08 MED ORDER — LISINOPRIL 20 MG PO TABS
20.0000 mg | ORAL_TABLET | Freq: Every day | ORAL | 0 refills | Status: DC
Start: 2020-05-09 — End: 2020-07-06

## 2020-05-08 MED ORDER — ONDANSETRON HCL 4 MG/2ML IJ SOLN
4.0000 mg | Freq: Once | INTRAMUSCULAR | Status: DC | PRN
Start: 2020-05-08 — End: 2020-05-08

## 2020-05-08 MED ORDER — GLYCOPYRROLATE 0.2 MG/ML IJ SOLN (WRAP)
INTRAMUSCULAR | Status: DC | PRN
Start: 2020-05-08 — End: 2020-05-08
  Administered 2020-05-08: .2 mg via INTRAVENOUS

## 2020-05-08 MED ORDER — PROPOFOL INFUSION 10 MG/ML
INTRAVENOUS | Status: DC | PRN
Start: 2020-05-08 — End: 2020-05-08
  Administered 2020-05-08: 200 mg via INTRAVENOUS

## 2020-05-08 MED ORDER — LIDOCAINE HCL (PF) 1 % IJ SOLN
INTRAMUSCULAR | Status: DC | PRN
Start: 2020-05-08 — End: 2020-05-08
  Administered 2020-05-08: 100 mg via INTRAVENOUS

## 2020-05-08 MED ORDER — LIDOCAINE HCL (PF) 2 % IJ SOLN
INTRAMUSCULAR | Status: AC
Start: 2020-05-08 — End: ?
  Filled 2020-05-08: qty 5

## 2020-05-08 MED ORDER — GABAPENTIN 300 MG PO CAPS
300.00 mg | ORAL_CAPSULE | Freq: Three times a day (TID) | ORAL | Status: DC
Start: 2020-05-08 — End: 2020-05-08
  Administered 2020-05-08: 18:00:00 300 mg via ORAL
  Filled 2020-05-08: qty 1

## 2020-05-08 MED ORDER — PROPOFOL INFUSION 10 MG/ML
INTRAVENOUS | Status: DC | PRN
Start: 2020-05-08 — End: 2020-05-08
  Administered 2020-05-08: 14:00:00 30 ug/kg/min via INTRAVENOUS

## 2020-05-08 MED ORDER — MEPERIDINE HCL 25 MG/ML IJ SOLN
25.0000 mg | INTRAMUSCULAR | Status: DC | PRN
Start: 2020-05-08 — End: 2020-05-08

## 2020-05-08 MED ORDER — GLYCOPYRROLATE 0.2 MG/ML IJ SOLN (WRAP)
INTRAMUSCULAR | Status: AC
Start: 2020-05-08 — End: ?
  Filled 2020-05-08: qty 1

## 2020-05-08 MED ORDER — METHOCARBAMOL 500 MG PO TABS
1000.0000 mg | ORAL_TABLET | Freq: Three times a day (TID) | ORAL | 0 refills | Status: DC | PRN
Start: 2020-05-08 — End: 2020-07-06

## 2020-05-08 MED ORDER — BUTALBITAL-APAP-CAFFEINE 50-325-40 MG PO TABS
1.0000 | ORAL_TABLET | Freq: Four times a day (QID) | ORAL | 0 refills | Status: DC | PRN
Start: 2020-05-08 — End: 2022-03-28

## 2020-05-08 MED ORDER — PROPOFOL 10 MG/ML IV EMUL (WRAP)
INTRAVENOUS | Status: AC
Start: 2020-05-08 — End: ?
  Filled 2020-05-08: qty 100

## 2020-05-08 MED ORDER — GABAPENTIN 300 MG PO CAPS: 300.0000 mg | ORAL_CAPSULE | Freq: Three times a day (TID) | ORAL | 0 refills | Status: DC

## 2020-05-08 MED ORDER — HYDROMORPHONE HCL 4 MG PO TABS
4.0000 mg | ORAL_TABLET | Freq: Four times a day (QID) | ORAL | 0 refills | Status: DC | PRN
Start: 2020-05-08 — End: 2020-05-19

## 2020-05-08 MED ORDER — OXYCODONE HCL 5 MG PO TABS
5.0000 mg | ORAL_TABLET | Freq: Once | ORAL | Status: DC | PRN
Start: 2020-05-08 — End: 2020-05-08

## 2020-05-08 MED ORDER — SUCCINYLCHOLINE CHLORIDE 20 MG/ML IJ SOLN
INTRAMUSCULAR | Status: AC
Start: 2020-05-08 — End: ?
  Filled 2020-05-08: qty 5

## 2020-05-08 MED ORDER — EPHEDRINE SULFATE 50 MG/ML IJ/IV SOLN (WRAP)
Status: AC
Start: 2020-05-08 — End: ?
  Filled 2020-05-08: qty 1

## 2020-05-08 MED ORDER — AMLODIPINE BESYLATE 5 MG PO TABS
10.0000 mg | ORAL_TABLET | Freq: Every day | ORAL | Status: DC
Start: 2020-05-08 — End: 2020-05-08
  Administered 2020-05-08: 09:00:00 10 mg via ORAL
  Filled 2020-05-08: qty 2

## 2020-05-08 MED ORDER — AMMONIA AROMATIC IN INHA
1.0000 | Freq: Once | RESPIRATORY_TRACT | Status: DC | PRN
Start: 2020-05-08 — End: 2020-05-08

## 2020-05-08 MED ORDER — LISINOPRIL 20 MG PO TABS
20.0000 mg | ORAL_TABLET | Freq: Every day | ORAL | Status: DC
Start: 2020-05-08 — End: 2020-05-08
  Administered 2020-05-08: 09:00:00 20 mg via ORAL
  Filled 2020-05-08: qty 1

## 2020-05-08 MED ORDER — SUCCINYLCHOLINE CHLORIDE 20 MG/ML IJ SOLN
INTRAMUSCULAR | Status: DC | PRN
Start: 2020-05-08 — End: 2020-05-08
  Administered 2020-05-08: 100 mg via INTRAVENOUS

## 2020-05-08 MED ORDER — FENTANYL CITRATE (PF) 50 MCG/ML IJ SOLN (WRAP)
INTRAMUSCULAR | Status: DC | PRN
Start: 2020-05-08 — End: 2020-05-08
  Administered 2020-05-08: 100 ug via INTRAVENOUS

## 2020-05-08 SURGICAL SUPPLY — 9 items
KIT CEMENT MIX DELIVERY VERTAPLEX 0507586000 (Kits) ×1 IMPLANT
KIT PCD PRECISION 11G W/CMNT (Cement) ×1 IMPLANT
KIT VERTAPLEX MIX + DELIVERY (Kits) ×2
NDLE BONE BIOPSY 11GX15 (Needles) ×1
NEEDLE BIOPSY OD11 GA L15 CM OSTEO-SITE (Needles) ×1
NEEDLE BIOPSY OD11 GA L15 CM OSTEO-SITE STAINLESS STEEL SHARP DIAMOND (Needles) ×1 IMPLANT
SYSTEM BONE CEMENT L5 IN KIT 1/2 DOSE (Cement) ×1 IMPLANT
SYSTEM BONE CEMENT L5 IN KIT 1/2 DOSE INTRODUCTION NEEDLE MIXER (Cement) ×1 IMPLANT
VERTAPLEX HV 20 GRAM TWIN PACK (1/2 DOSE) (Cement & Matls.) ×2 IMPLANT

## 2020-05-08 NOTE — Nursing Progress Note (Signed)
Telephone report given to [place], RN in [place] original building.

## 2020-05-08 NOTE — Nursing Progress Note (Signed)
Dr. Jimmie Molly at bedside assessing patient.

## 2020-05-08 NOTE — Transfer of Care (Signed)
Anesthesia Transfer of Care Note    Patient: Victoria Woodward    Procedures performed: Procedure(s):  VERTEBROPLASTY/KYPHOPLASTY    Anesthesia type: General ETT    Patient location:Phase I PACU    Last vitals:   Vitals:    05/08/20 1146   BP: (!) 179/102   Pulse: 72   Resp: 18   Temp: 36.9 C (98.5 F)   SpO2: 96%       Post pain: Patient not complaining of pain, continue current therapy      Mental Status:awake    Respiratory Function: tolerating face mask    Cardiovascular: stable    Nausea/Vomiting: patient not complaining of nausea or vomiting    Hydration Status: adequate    Post assessment: no apparent anesthetic complications    Signed by: Derrick Ravel  05/08/20 3:29 PM

## 2020-05-08 NOTE — UM Notes (Addendum)
CSR for 05/08/20    Admit Date: 05/06/20  Status: Observation  Unit Location: Medsurg    HPI:   39 y.o female with complaints of intractable back pain, headache, and has hypertension found to have:  Closed compression fracture of body of L1 vertebra   Closed fracture of ninth thoracic vertebra, unspecified fracture morphology, initial encounter   Closed fracture of tenth thoracic vertebra, unspecified fracture morphology, initial encounter   Closed fracture of eleventh thoracic vertebra, unspecified fracture morphology, initial encounter                  VS: 98.5, 79, 18, 179/102, 96%    Operative Procedure:   Procedure(s):  VERTEBROPLASTY/KYPHOPLASTY    MEDS:   IV zofran x1    PLAN:   Vertebroplasty completed today  Awaiting attending plan of care      Osa Craver, RN   Utilization Review Nurse (PRN)  [facility]  Main Office: [phone]  Main fax: [phone]

## 2020-05-08 NOTE — Progress Notes (Signed)
Pt active with [facility]   Notified agency by email Confirmed no orders needed as pt was in obs status less then 48 hrs

## 2020-05-08 NOTE — Discharge Instr - AVS First Page (Addendum)
You do not use to use TLSO brace as per your radiologist (Dr. Jimmie Molly)      Discharge Instructions for Vertebroplasty  You had a vertebroplasty on the bones in your spine (vertebrae). That means a healthcare provider injected surgical cement into the fractured vertebrae of your spine. This was done to help ease back pain caused by fractured vertebrae. The procedure will also help make your spine more stable. Here are some home care instructions for you to follow after the surgery.  Activity  If a brace was prescribed to you, wear it as directed. And only bend within the limits of your brace.  Take short walks. Start by walking for 5 minutes and slowly build up your time and distance.  Don't drive for 2 days after the procedure, or as directed by your healthcare provider. And never drive while you are taking narcotic pain medicine.  Don't do any heavy lifting for 3 months (nothing heavier than 5 pounds or 2.27 kg). After 3 months, you can slowly increase your lifting to normal unless directed otherwise by your provider.    Home care  Take your medicine exactly as directed. Call your healthcare provider if you have side effects.  Remove the small bandages on your cut (incision) after 24 to 48 hours or as directed by your provider. Often there are no stitches to be removed.  Wait 1 to 2 days before showering or taking a bath. And don't swim in a pool or sit in a hot tub until your provider tells you its OK.  Have someone help apply an ice pack to ease the pain around the incisions. Leave the ice pack in place for 20 minutes, then leave it off for 20 minutes. Pain at the incision sites may last for a few days. To make an ice pack, put ice cubes in a plastic bag that seals at the top. Wrap the bag in a clean, thin towel or cloth. Never put ice or an ice pack directly on the skin.  Keep your head raised when lying down for 1 to 2 days after the surgery.    Follow-up care  Follow up with your healthcare provide, or as  advised.  Call 911  Call 911right away if you have:  Chest pain  Shortness of breath  Trouble controlling your bladder or bowels  Trouble walking  When to call your healthcare provider  Call your healthcare provider right away if you have:     Fever of 100.4(38C) or higher, or as directed by your provider  Shaking chills  Severe pain or more redness, swelling, drainage, or warmth around the incision sites  Weakness, numbness, or tingling in your legs     [employer] last reviewed this educational content on 04/14/2018   2000-2021 [employer], [place]. All rights reserved. This information is not intended as a substitute for professional medical care. Always follow your healthcare professional's instructions.

## 2020-05-08 NOTE — Progress Notes (Addendum)
Patient returned from recovery in significant pain. Scheduled gabapentin and prn dilaudid po given. MD notified of pain levels. Will continue to assess for pain, provide meds as needed as ordered, and monitor for side effects.    BP elevated. 181/97. MD made aware. Will recheck when pain better controlled and if still high will give prn hydralazine.     Tolerating sips of clears. Will request a diet order.     oob to stand at bedside.     Safe environment provided. Hourly rounding discussed and done. Call bell, table and phone within reach. Fall prevention plan discussed and interventions in place. Education and emotional support provided. Plan of care and discharge plan discussed.    Will continue to assess patient, and will notify MD for changes outside normal limits.

## 2020-05-08 NOTE — Anesthesia Preprocedure Evaluation (Signed)
Anesthesia Evaluation    AIRWAY    Mallampati: III    TM distance: >3 FB  Neck ROM: full  Mouth Opening:full   CARDIOVASCULAR    cardiovascular exam normal       DENTAL           PULMONARY    pulmonary exam normal     OTHER FINDINGS                  Relevant Problems   NEURO/PSYCH   (+) Headache      CARDIO   (+) Hypertension       PSS Anesthesia Comments: Victoria Woodward 41F  BMI 42  HTN, L1 compression fracture  05/07/20  Hct 38.1  Plt 234        Anesthesia Plan    ASA 3     general                     Detailed anesthesia plan: general endotracheal        Post op pain management: per surgeon    informed consent obtained      pertinent labs reviewed             Signed by: Derrick Ravel 05/08/20 1:36 PM

## 2020-05-08 NOTE — Plan of Care (Signed)
Problem: Pain  Goal: Pain at adequate level as identified by patient  Outcome: Progressing  Flowsheets (Taken 05/08/2020 0137)  Pain at adequate level as identified by patient:   Identify patient comfort function goal   Assess for risk of opioid induced respiratory depression, including snoring/sleep apnea. Alert healthcare team of risk factors identified.   Reassess pain within 30-60 minutes of any procedure/intervention, per Pain Assessment, Intervention, Reassessment (AIR) Cycle   Evaluate if patient comfort function goal is met   Consult/collaborate with Physical Therapy, Occupational Therapy, and/or Speech Therapy   Assess pain on admission, during daily assessment and/or before any "as needed" intervention(s)   Evaluate patient's satisfaction with pain management progress   Offer non-pharmacological pain management interventions   Consult/collaborate with Pain Service   Include patient/patient care companion in decisions related to pain management as needed     Problem: Moderate/High Fall Risk Score >5  Goal: Patient will remain free of falls  Outcome: Progressing  Flowsheets (Taken 05/08/2020 0137)  VH High Risk (Greater than 13):   ALL REQUIRED LOW INTERVENTIONS   ALL REQUIRED MODERATE INTERVENTIONS   A safety companion may be used when deemed appropriate by the Primary RN and Clinical Administrator   BED ALARM WILL BE ACTIVATED WHEN THE PATEINT IS IN BED WITH SIGNAGE "RESET BED ALARM"   RED "HIGH FALL RISK" SIGNAGE   PATIENT IS TO BE SUPERVISED FOR ALL TOILETING ACTIVITIES   A CHAIR PAD ALARM WILL BE USED WHEN PATIENT IS UP SITTING IN A CHAIR   Keep door open for better visibility   Request PT/OT therapy consult order from physician for patients with gait/mobility impairment   Use assistive devices   Use chair-pad alarm device   Include family/significant other in multidisciplinary discussion regarding plan of care as appropriate   Use of floor mat   Use of roll guard   Use of "STOP ask for help" sign      Problem: Impaired Mobility  Goal: Mobility/Activity is maintained at optimal level for patient  Outcome: Progressing  Flowsheets (Taken 05/08/2020 0137)  Mobility/activity is maintained at optimal level for patient:   Increase mobility as tolerated/progressive mobility   Encourage independent activity per ability   Maintain proper body alignment   Plan activities to conserve energy, plan rest periods   Perform active/passive ROM   Reposition patient every 2 hours and as needed unless able to reposition self   Assess for changes in respiratory status, level of consciousness and/or development of fatigue   Consult/collaborate with Physical Therapy and/or Occupational Therapy     Problem: Neurological Deficit  Goal: Neurological status is stable or improving  Outcome: Progressing  Flowsheets (Taken 05/08/2020 0137)  Neurological status is stable or improving:   Monitor/assess/document neurological assessment (Stroke: every 4 hours)   Re-assess NIH Stroke Scale for any change in status   Observe for seizure activity and initiate seizure precautions if indicated   Monitor/assess NIH Stroke Scale   Perform CAM Assessment     Comment: High fall risk, call bell & possessions on bedside table within reach, chair & bed locked at lowest position, 3 bed rails up, non-skid socks on, bed alarm on, fall mat down, continuing to monitor via hourly rounding & will report any changes.

## 2020-05-08 NOTE — Discharge Summary (Signed)
HOSPITALIST DISCHARGE SUMMARY    Date Time: 05/08/20 4:29 PM  Patient Name: Victoria Woodward  Attending Physician: Gildardo Griffes, DO    Date of Admission:   05/06/2020    Date of Discharge:   05/08/2020    Reason for Admission:   Closed fracture of ninth thoracic vertebra, unspecified fracture morphology, initial encounter [S22.079A]  Closed fracture of tenth thoracic vertebra, unspecified fracture morphology, initial encounter [S22.079A]  Closed fracture of eleventh thoracic vertebra, unspecified fracture morphology, initial encounter [S22.089A]  Closed compression fracture of body of L1 vertebra [S32.010A]    Problems:   Lists the present on admission hospital problems  Present on Admission:   Closed compression fracture of body of L1 vertebra   Intractable back pain   Headache   Hypertension      Problem Lists:  Patient Active Problem List   Diagnosis    Closed wedge compression fracture of L1 vertebra, initial encounter    Pain, acute due to trauma    Fall    Obese    Adjustment reaction with anxiety and depression    Closed compression fracture of body of L1 vertebra    Intractable back pain    Headache    Hypertension       Discharge Dx:   Closed fracture of ninth thoracic vertebra, unspecified fracture morphology, initial encounter [S22.079A]  Closed fracture of tenth thoracic vertebra, unspecified fracture morphology, initial encounter [S22.079A]  Closed fracture of eleventh thoracic vertebra, unspecified fracture morphology, initial encounter [S22.089A]  Closed compression fracture of body of L1 vertebra [S32.010A]    Consultations:   Treatment Team: Attending Provider: Gildardo Griffes, DO; Consulting Physician: Gildardo Griffes, DO; Registered Nurse: Elesa Massed, RN    Procedures performed:     Vertebroplasty/Kyphoplasty   Final Result      L1 vertebroplasty as above.      END OF IMPRESSION.      Victoria Woodward    05/08/2020 4:00 PM      CT Thoracic Spine WO Contrast   Final Result     CT examination of the thoracic and lumbar spine again shows   a subacute fracture of the L1 vertebral body with increasing loss of   height. No definite acute fractures are seen. The examination shows no   definite injuries in the thoracic spine. The previously described subtle   injuries of the lower thoracic vertebrae are no longer appreciated.         Victoria Dibble, MD    05/07/2020 2:25 AM      CT Lumbar Spine WO Contrast   Final Result    CT examination of the thoracic and lumbar spine again shows   a subacute fracture of the L1 vertebral body with increasing loss of   height. No definite acute fractures are seen. The examination shows no   definite injuries in the thoracic spine. The previously described subtle   injuries of the lower thoracic vertebrae are no longer appreciated.         Victoria Dibble, MD    05/07/2020 2:25 AM          Presenting history and hospital Course:   39 y.o. female hx endometriosis s/p removal and partial hysterectomy, depression, anxiety, admit 6/2-6/4 for fall and L1 fracture + ?small T9-T11 fractures s/p TLSO brace who presented to the hospital with hypertension, intractable back pain. She was discharged on oxycodone, gabapentin, cyclobenzaprine, ibuprofen, and clonazepam. She continued to have pain. She saw her PMD who increased  her gabapentin. She stated uncontrolled back pain upon presentation here. She denied new leg weakness. She was using her TLSO brace and was able to return back to work. She was seen by Neuro IR and underwent Kyphoplasty for Closed compression fracture of body of L1 vertebra. Patient was assesed in detail with DR. Jimmie Molly after procedure. Patient really wanted to be discharged home as long as she was given pain meds. She was ambulated by RN in the hallway and cleared for discharge home by Dr. Lenore Manner. Her BP was also uncontrolled upon presentation. She was started on BP meds. She was asked to maintain BP log and bring results to her PCP.    Physical exam at  discharge:  Vitals:    05/08/20 1610   BP: (!) 147/92   Pulse: 87   Resp: 19   Temp:    SpO2: 97%     General: awake, alert, oriented x 3; no acute distress.  HEENT: perrla, eomi, sclera anicteric  oropharynx clear without lesions, mucous membranes moist  Neck: supple, no lymphadenopathy, no thyromegaly, no JVD, no carotid bruits  Cardiovascular: regular rate and rhythm, no murmurs, rubs or gallops  Lungs: clear to auscultation bilaterally, without wheezing, rhonchi, or rales  Abdomen: soft, non-tender, non-distended; no palpable masses, no hepatosplenomegaly, normoactive bowel sounds, no rebound or guarding  Extremities: no clubbing, cyanosis, or edema  Neuro: cranial nerves grossly intact, strength 5/5 in upper and lower extremities, sensation intact  Skin: no rashes or lesions noted    Discharge Medications:        Medication List      START taking these medications    amLODIPine 10 MG tablet  Commonly known as: NORVASC  Take 1 tablet (10 mg total) by mouth daily     butalbital-acetaminophen-caffeine 50-325-40 MG per tablet  Commonly known as: FIORICET  Take 1 tablet by mouth every 6 (six) hours as needed for Headaches     HYDROmorphone 4 MG tablet  Commonly known as: DILAUDID  Take 1 tablet (4 mg total) by mouth every 6 (six) hours as needed for Pain     lisinopril 20 MG tablet  Commonly known as: ZESTRIL  Take 1 tablet (20 mg total) by mouth daily  Start taking on: May 09, 2020     methocarbamol 500 MG tablet  Commonly known as: ROBAXIN  Take 2 tablets (1,000 mg total) by mouth every 8 (eight) hours as needed (muscle spasm)        CHANGE how you take these medications    gabapentin 300 MG capsule  Commonly known as: NEURONTIN  Take 1 capsule (300 mg total) by mouth every 8 (eight) hours  What changed:    medication strength   how much to take        CONTINUE taking these medications    acetaminophen 325 MG tablet  Commonly known as: TYLENOL  Take 2 tablets (650 mg total) by mouth every 6 (six) hours as  needed for Pain     clonazePAM 1 MG tablet  Commonly known as: KlonoPIN     ibuprofen 800 MG tablet  Commonly known as: ADVIL  Take 1 tablet (800 mg total) by mouth every 6 (six) hours as needed for Pain     venlafaxine 37.5 MG 24 hr capsule  Commonly known as: EFFEXOR-XR           Where to Get Your Medications      These medications were sent to [id number] - [place],  [place] - [address]  [address] [place] [address]    Phone: [phone]    amLODIPine 10 MG tablet   butalbital-acetaminophen-caffeine 50-325-40 MG per tablet   gabapentin 300 MG capsule   HYDROmorphone 4 MG tablet   lisinopril 20 MG tablet   methocarbamol 500 MG tablet          Discharge Instructions:   Renaldo Reel, MD  [address]  [address] [address]  [phone]    Schedule an appointment as soon as possible for a visit in 2 week(s)      Shon Millet, MD  [address]  [place] [address]  [phone]    Schedule an appointment as soon as possible for a visit in 4 week(s)          TIME SPENT:   On discharge and care coordination is 35 minutes. I have spent a large amount of time explaining everything to the patient and family at bedside, going over all the details of discharge diagnoses and the follow up plan. Patient has been supplied with copies of the laboratory tests as well as radiological studies that were done during this hospitalization.     Signed by: Gildardo Griffes, DO    CC to: Renaldo Reel, MD

## 2020-05-08 NOTE — Anesthesia Postprocedure Evaluation (Addendum)
Anesthesia Post Evaluation    Patient: Victoria Woodward    Procedure(s):  VERTEBROPLASTY/KYPHOPLASTY    Anesthesia type: general    Last Vitals:   Vitals Value Taken Time   BP 181/89 05/08/20 1542   Temp  05/08/20 1547   Pulse 77 05/08/20 1546   Resp 7 05/08/20 1546   SpO2 100 % 05/08/20 1546   Vitals shown include unvalidated device data.              Anesthesia Post Evaluation:     Patient Evaluated: PACU    Level of Consciousness: awake and alert    Pain Management: adequate    Airway Patency: patent    Anesthetic complications: No      PONV Status: none    Cardiovascular status: acceptable and hypertensive  Respiratory status: acceptable  Hydration status: acceptable  Comments: Discharged home yesterday.        Signed by: Derrick Ravel, 05/08/2020 3:47 PM

## 2020-05-08 NOTE — Progress Notes (Signed)
Assessment Smart phrase for obs and low risk patient     Introduced [employer] and self to patient     Discussed plan of care and possible discharge needs with patient/family/care-giver .    Patient has cognitive ability to participate in care decisions and follow-up care as needed Y    Verified (and corrected if needed) demographics and PCP on facesheet Y    Assessed for lack of insurance or underinsured. Y    Patient is NOT identified as a Medicare focused patient or high risk for failed discharge plan or readmission as detailed in the High Risk Patient Screening policy Y    Plan of care: pending vertebroplasty    Discharge Plan A: home w/ HHC [facility])  Discharge Plan B: SNF/ARF    Expected discharge date: unknown    Barriers to discharge: n/a                Doyne Keel, MSW, LCSW  Social Work Case Manager  PRN Case Management  [phone]

## 2020-05-08 NOTE — H&P (Signed)
NEUROINTERVENTIONAL RADIOLOGY H&P    Date Time: 05/08/20 1:32 PM    PROCEDURALIST COMMENTS BELOW:   VP L1      INDICATIONS:   Procedure(s):  VERTEBROPLASTY/KYPHOPLASTY      PAST MEDICAL HISTORY:     Past Medical History:   Diagnosis Date    Ankle fracture     left    Anxiety     Boxer's fracture     right    Depression     Endometrioma     Hx of back injury        PAST SURGICAL HISTORY     Past Surgical History:   Procedure Laterality Date    CESAREAN SECTION  2003    ENDOMETRIOSIS SURGERY      had endometrioma removed from abdominal wall with mesh placement    HYSTERECTOMY  2006    LIFT, ARM (COSMETIC) Bilateral     Breast         REVIEW OF SYSTEMS REVIEWED:   YES  (x  )      CURRENT MEDICATION REVIEWED   YES  ( x )        ALLERGIES:     Allergies   Allergen Reactions    Morphine Itching         PREVIOUS REACTION TO SEDATION MEDICATIONS     NO (x  YES ( )    PREVIOUS HIGH RADIATION DOSE CASE IN PAST 6 MONTHS?     NO (x )   YES ( )    PHYSICAL EXAM     AIRWAY CLASSIFICATION:    CLASS I   (  )     CLASS II  (  )    CLASS III  ( x )     CLASS IV  (  )    INTUBATED (  )    CARDIAC :   RRR    LUNGS:   CLEAR    ABDOMEN:   SOFT    NEURO:     normal without focal findings      LABS:     Lab Results   Component Value Date/Time    WBC 5.56 05/07/2020 03:26 AM    HCT 38.1 05/07/2020 03:26 AM    INR 1.1 05/07/2020 03:26 AM    PT 12.3 05/07/2020 03:26 AM    PTT 34 05/07/2020 03:26 AM    BUN 13.0 05/07/2020 03:26 AM    CREAT 0.9 05/07/2020 03:26 AM    GLU 107 (H) 05/07/2020 03:26 AM    K 3.6 05/07/2020 03:26 AM             ASA PHYSICAL STATUS   (  )  ASA 1   HEALTHY PATIENT  (  )  ASA 2   MILD SYSTEMIC ILLNESS  (x  )  ASA 3   SYSTEMIC DISEASE, NOT INCAPACITATING  (  )  ASA 4   SEVERE SYSTEMIC DISEASE, DISEASE IS CONSTANT THREAT TO                         LIFE  (  )  ASA 5   MORIBUND CONDITION, NOT EXPECTED TO LIVE >24 HOURS            IRRESPECTIVE OF PROCEDURE  (  )  E           EMERGENCY PROCEDURE       PLANNED  SEDATION:   (  )  NO SEDATION  (  ) MODERATE SEDATION  (x  ) DEEP SEDATION WITH ANESTHESIA      CONCLUSION:   PATIENT HAS BEEN REASSESSED IMMEDIATELY PRIOR TO THE PROCEDURE   AND IS AN APPROPRIATE CANDIDATE FOR THE PLANNED SEDATION AND   PROCEDURE.  RISKS, BENEFITS AND ALTERNATIVES TO THE PLANNED   PROCEDURE HAVE BEEN EXPLAINED TO THE PATIENT OR GUARDIAN, INCLUDING THE RISKS OF RADIATION EXPOSURE AND SEDATION.    (x  )  YES  (  )  EMERGENCY CONSENT     The plan for neurointerventional care was discussed with the patient as well as the risks, benefits and alternatives. The risks include but are not limited to major bleeding, possibly requiring transfusion and/or surgery for remedy, infection, permanent weakness, paralysis, coma, stroke, need for further surgery, failure to improve and even death. The patient understands these risks and wished to proceed with treatment as described. No guarantees were provided and I have been asked to proceed.    Signed by Shon Millet.  Interventional Neuroradiology  Office number: [phone]

## 2020-05-08 NOTE — Sedation Documentation (Signed)
Dr. Jimmie Molly completed L1 vertebroplasty without complication. Patient tolerated procedure well. VS stable. No bleeding or hematoma noted to access site. Dressing applied by IR tech, Aivy. Patient denies pain. Report called to receiving RN. To be transported to radiology recovery via anesthesia.    Sedation and Vital signs per anesthesia flowsheets.

## 2020-05-08 NOTE — Op Note (Signed)
NEUROINTERVENTIONAL RADIOLOGY PROCEDURE NOTE    Date Time: 05/08/20 3:03 PM    Patient Name:   Victoria Woodward    Date of Operation:   05/08/2020    Providers Performing:   Surgeon(s):  Shon Millet, MD      Operative Procedure:   Procedure(s):  VERTEBROPLASTY/KYPHOPLASTY    Preoperative Diagnosis:   Pre-Op Diagnosis Codes:     * Closed compression fracture of body of L1 vertebra [S32.010A]    Postoperative Diagnosis:   * No post-op diagnosis entered *    Anesthesia:   (  ) SEDATION  (  ) LOCAL  ( x ) ANESTHESIA (DEPT OF ANESTHESIOLOGY) )    Estimated Blood Loss:   Minimal       RADIATION DOSE   12.4 min  FLUORO TIME  1193 m Gy    Findings:   L1 vp. Some cement extravasated anteriorly, but stayed localized.  Full report to follow.        Signed by: Shon Millet, MD                                                                              FX IVR

## 2020-05-08 NOTE — Sedation Documentation (Signed)
Patient arrived to IR procedure room for L1 vertebroplasty. Patient AAOX4. Informed consent for procedure signed in chart. Patient transferred with full assist to procedure table to prone position after intubation. Monitors attached. Sedation/intubation per anesthesia. Padded arm boards in place. Draped and prepped in sterile fashion by IR tech, Aivy.     For all vitals and frequent charting requirements, please see anesthesia record.

## 2020-05-08 NOTE — Nursing Progress Note (Signed)
Patient received to recovery area in NAD s/p vertebroplasty/kyphoplasty.  VSS on O2 6L/min face mask.  Lower back puncture sites x2 soft and non tender, dressings intact with sanguinous drainage noted R>L.  Will continue to monitor patient.

## 2020-05-08 NOTE — Progress Notes (Signed)
Patient discharged home. No special discharge needs. IV removed by tech.     Prescriptions esent to pharmacy of choice. Discharge instructions and education given to patient and reviewed. All questions answered and patient verbalized understanding of when to call MD and when to follow up.     Patient left unit via wheelchair transport.

## 2020-05-08 NOTE — Nursing Progress Note (Signed)
Low back pain lessened to 6/10 after pain medication given, Dr. Jimmie Molly notified, states okay to transfer back to inpatient room.  Patient tolerating ice chips.  Transportation arrived for transport back to room 567.

## 2020-05-15 ENCOUNTER — Encounter: Payer: Self-pay | Admitting: Diagnostic Radiology

## 2020-05-15 ENCOUNTER — Telehealth: Payer: Self-pay | Admitting: Diagnostic Radiology

## 2020-05-15 ENCOUNTER — Ambulatory Visit (INDEPENDENT_AMBULATORY_CARE_PROVIDER_SITE_OTHER): Payer: BLUE CROSS/BLUE SHIELD | Admitting: Internal Medicine

## 2020-05-15 NOTE — Telephone Encounter (Signed)
Called an left message for pt to return call s/p vertebroplasty

## 2020-05-15 NOTE — Telephone Encounter (Signed)
Pt returned call stating that she is better but still has pain. She states that her pain was an 11-12 on a scale of 1-10.  Her pain level is now a 5 and feels like it is muscle spasms.it is right at the level of the puncture site. She states that the site is good without signs of infections.  She continues with gabapentin, robaxin, tylenol and dilaudid to manage her discomfort.  I encouraged her to use heat and moist heat (shower) to her back to help with this as well as ice alternatively. She asked for a work note which has been provided

## 2020-05-19 ENCOUNTER — Emergency Department
Admission: EM | Admit: 2020-05-19 | Discharge: 2020-05-20 | Disposition: A | Discharge status: Home / Self Care | Payer: BLUE CROSS/BLUE SHIELD | Attending: Emergency Medicine | Admitting: Emergency Medicine

## 2020-05-19 ENCOUNTER — Emergency Department: Payer: BLUE CROSS/BLUE SHIELD

## 2020-05-19 DIAGNOSIS — M549 Dorsalgia, unspecified: Secondary | ICD-10-CM

## 2020-05-19 DIAGNOSIS — M545 Low back pain: Secondary | ICD-10-CM | POA: Insufficient documentation

## 2020-05-19 DIAGNOSIS — G8918 Other acute postprocedural pain: Secondary | ICD-10-CM | POA: Insufficient documentation

## 2020-05-19 DIAGNOSIS — F17210 Nicotine dependence, cigarettes, uncomplicated: Secondary | ICD-10-CM | POA: Insufficient documentation

## 2020-05-19 MED ORDER — HYDROMORPHONE HCL 1 MG/ML IJ SOLN
1.00 mg | Freq: Once | INTRAMUSCULAR | Status: AC
Start: 2020-05-19 — End: 2020-05-19
  Administered 2020-05-19: 23:00:00 1 mg via INTRAVENOUS
  Filled 2020-05-19: qty 1

## 2020-05-19 MED ORDER — HYDROMORPHONE HCL 4 MG PO TABS
4.0000 mg | ORAL_TABLET | Freq: Four times a day (QID) | ORAL | 0 refills | Status: DC | PRN
Start: 2020-05-19 — End: 2020-07-06

## 2020-05-19 MED ORDER — NALOXONE HCL 4 MG/0.1ML NA LIQD
NASAL | 0 refills | Status: DC
Start: 2020-05-19 — End: 2022-03-28

## 2020-05-19 MED ORDER — IBUPROFEN 600 MG PO TABS
600.0000 mg | ORAL_TABLET | Freq: Four times a day (QID) | ORAL | 0 refills | Status: DC | PRN
Start: 2020-05-19 — End: 2020-07-06

## 2020-05-19 MED ORDER — KETOROLAC TROMETHAMINE 30 MG/ML IJ SOLN
30.00 mg | Freq: Once | INTRAMUSCULAR | Status: AC
Start: 2020-05-19 — End: 2020-05-19
  Administered 2020-05-19: 23:00:00 30 mg via INTRAVENOUS
  Filled 2020-05-19: qty 1

## 2020-05-19 MED ORDER — ACETAMINOPHEN 500 MG PO TABS
1000.0000 mg | ORAL_TABLET | Freq: Once | ORAL | Status: DC
Start: 2020-05-19 — End: 2020-05-19

## 2020-05-19 MED ORDER — LIDOCAINE 5 % EX PTCH
1.00 | MEDICATED_PATCH | Freq: Once | CUTANEOUS | Status: DC
Start: 2020-05-19 — End: 2020-05-19

## 2020-05-19 MED ORDER — DIAZEPAM 5 MG PO TABS
5.0000 mg | ORAL_TABLET | Freq: Once | ORAL | Status: DC
Start: 2020-05-19 — End: 2020-05-19

## 2020-05-19 MED ORDER — IBUPROFEN 600 MG PO TABS
600.0000 mg | ORAL_TABLET | Freq: Once | ORAL | Status: DC
Start: 2020-05-19 — End: 2020-05-19

## 2020-05-19 NOTE — ED Notes (Signed)
I am not the primary provider for this ED visit. I have put in labs, meds, radiology orders only for the purpose of expediting care.       Izetta Dakin, MD  05/19/20 2215

## 2020-05-19 NOTE — Discharge Instructions (Signed)
Dear [name]:    Thank you for choosing the [facility] Emergency Department, the premier emergency department in the [place] area.  I hope your visit today was EXCELLENT. You will receive a survey via text message that will give you the opportunity to provide feedback to your team about your visit. Please do not hesitate to reach out with any questions!    Specific instructions for your visit today:      Back Pain NOS    You have been seen for back pain.    Back pain can happen anywhere from the neck down to the low back. Back pain has many different causes. Some of the more common are: Bone pain, muscle strain, muscle spasm, pain from overuse, and pinched nerves. Other problems can cause what feels like back pain. But the pain is really coming from another organ. A kidney infection can cause lower back pain.    Your doctor did not find any pain over the bones in your back (even though you might have pain in the muscles of the back). This means it is very unlikely that you have a broken bone (fracture) in your back. Your doctor did not think it was necessary to take an x-ray.    The doctor still does not know the exact cause of your pain. Your problem does not seem to be from a dangerous cause. It is OK for you to go home today.    Some things you can try to help your back feel better are:   Apply a warm damp washcloth to the back where you have pain for 20 minutes at a time, at least 4 times per day.   Have someone massage the sore parts of your back.   Don't do any heavy lifting or bending. You can go back to normal daily activities if they don't make the pain worse.   You can use anti-inflammatory pain medicine for your pain. This could be Ibuprofen (Advil or Motrin). You can buy these at most stores. Follow the directions on the package.    This pain may last for the next few days.     Call your doctor or go to the nearest Emergency Department if you your pain does not improve within 4  weeks or your pain is bad enough to seriously limit your normal activities.    YOU SHOULD SEEK MEDICAL ATTENTION IMMEDIATELY, EITHER HERE OR AT THE NEAREST EMERGENCY DEPARTMENT, IF ANY OF THE FOLLOWING OCCURS:   You think the pain is coming from somewhere other than your back. This can include chest pain. This is sometimes from angina (heart pains) or other dangerous causes.   You have shortness of breath, sweating, chest pain (or pressure, heaviness, indigestion, etc).   You have abdominal (belly) pain that goes through to your back.   Your arms and legs tingle or get numb (lose feeling).   Your arms or legs are weak.   You lose control of your bladder or bowels. If this were to happen, it may cause you to wet or soil yourself.   You have problems urinating (peeing).   You have fever (temperature higher than 100.49F / 38C).   Your pain gets worse.   Your symptoms get worse or you have new symptoms or concerns.    If you can't follow up with your doctor, or if at any time you feel you need to be rechecked or seen again, come back here or go to the  nearest emergency department.                 IF YOU DO NOT CONTINUE TO IMPROVE OR YOUR CONDITION WORSENS, PLEASE CONTACT YOUR DOCTOR OR RETURN IMMEDIATELY TO THE EMERGENCY DEPARTMENT.    Sincerely,  Victoria Woodward, Victoria Pates, MD  Attending Emergency Physician  [facility] Emergency Department    ONSITE PHARMACY  Our full service onsite pharmacy is located in the ER waiting room.  Open 7 days a week from 9 am to 9 pm.  We accept all major insurances and prices are competitive with major retailers.  Ask your provider to print your prescriptions down to the pharmacy to speed you on your way home.    OBTAINING A PRIMARY CARE APPOINTMENT    Primary care physicians (PCPs, also known as primary care doctors) are either internists or family medicine doctors. Both types of PCPs focus on health promotion, disease prevention, Woodward education and counseling, and  treatment of acute and chronic medical conditions.    If you need a primary care doctor, please call the below number and ask who is receiving new patients.     [facility]  Telephone:  [phone]  [email or web address]    DOCTOR REFERRALS  Call [phone] (available 24 hours a day, 7 days a week) if you need any further referrals and we can help you find a primary care doctor or specialist.  Also, available online at:  [email or web address]    YOUR CONTACT INFORMATION  Before leaving please check with registration to make sure we have an up-to-date contact number.  You can call registration at [phone] to update your information.  For questions about your hospital bill, please call [phone].  For questions about your Emergency Dept Physician bill please call [phone].      FREE HEALTH SERVICES  If you need help with health or social services, please call 2-1-1 for a free referral to resources in your area.  2-1-1 is a free service connecting people with information on health insurance, free clinics, pregnancy, mental health, dental care, food assistance, housing, and substance abuse counseling.  Also, available online at:  http://www.[facility].org    MEDICAL RECORDS AND TESTS  Certain laboratory test results do not come back the same day, for example urine cultures.   We will contact you if other important findings are noted.  Radiology films are often reviewed again to ensure accuracy.  If there is any discrepancy, we will notify you.      Please call [phone] to pick up a complimentary CD of any radiology studies performed.  If you or your doctor would like to request a copy of your medical records, please call [phone].      ORTHOPEDIC INJURY   Please know that significant injuries can exist even when an initial x-ray is read as normal or negative.  This can occur because some fractures (broken bones) are not initially visible on x-rays.  For this reason,  close outpatient follow-up with your primary care doctor or bone specialist (orthopedist) is required.    MEDICATIONS AND FOLLOWUP  Please be aware that some prescription medications can cause drowsiness.  Use caution when driving or operating machinery.    The examination and treatment you have received in our Emergency Department is provided on an emergency basis, and is not intended to be a substitute for your primary care physician.  It is important that your doctor checks you again and that you report any new or remaining  problems at that time.      24 HOUR PHARMACIES  The nearest 24 hour pharmacy is:    [facility]  [address]  [place], [place] [address]  [phone]      ASSISTANCE WITH INSURANCE    Affordable Care Act  [facility])  Call to start or finish an application, compare plans, enroll or ask a question.  [phone]  TTY: (773)084-2024  Web:  Healthcare.gov    Help Enrolling in [facility]  [employer] [place]  5800281816 (TOLL-FREE)  873-547-4267 (TTY)  Web:  Http://www.coverva.org    Local Help Enrolling in the [facility]  [place] [facility]  563-384-3771 (MAIN)  Email:  health-help@nvfs .org  Web:  BlackjackMyths.is  Address:  [address] [place], [place] [address]    SEDATING MEDICATIONS  Sedating medications include strong pain medications (e.g. narcotics), muscle relaxers, benzodiazepines (used for anxiety and as muscle relaxers), Benadryl/diphenhydramine and other antihistamines for allergic reactions/itching, and other medications.  If you are unsure if you have received a sedating medication, please ask your physician or nurse.  If you received a sedating medication: DO NOT drive a car. DO NOT operate machinery. DO NOT perform jobs where you need to be alert.  DO NOT drink alcoholic beverages while taking this medicine.     If you get dizzy, sit or lie down at the first signs. Be careful going up and down stairs.  Be extra careful to prevent  falls.     Never give this medicine to others.     Keep this medicine out of reach of children.     Do not take or save old medicines. Throw them away when outdated.     Keep all medicines in a cool, dry place. DO NOT keep them in your bathroom medicine cabinet or in a cabinet above the stove.    MEDICATION REFILLS  Please be aware that we cannot refill any prescriptions through the ER. If you need further treatment from what is provided at your ER visit, please follow up with your primary care doctor or your pain management specialist.    FREESTANDING EMERGENCY DEPARTMENTS OF [facility]  Did you know [facility] has two freestanding ERs located just a few miles away?  [facility] [place] and [facility] [place] have short wait times, easy free parking directly in front of the building and top Woodward satisfaction scores - and the same Board Certified Emergency Medicine doctors as [facility].

## 2020-05-19 NOTE — ED Provider Notes (Signed)
[facility] EMERGENCY DEPARTMENT H&P      Visit date: 05/19/2020      CLINICAL SUMMARY           Diagnosis:    .     Final diagnoses:   Other acute back pain         MDM Notes:    Patient presenting with low back pain status post vertebroplasty.  No fever, no concern for infection.  Advised to continue to wear brace for comfort, as needed, and referred to pain management.  No acute neuro deficits or concern for cord compression.  Patient out of pain meds, which is likely why her pain is not controlled at this time.  Given strict return precautions.           Disposition:         Discharge           I was present for the bedside discharge alongside the patient's ED nurse. Course of visit in the ED and discharge instructions were reviewed with patient and they were given the opportunity to ask any questions regarding their care today. Patient and/ or patient's family verbalized understanding of, and comfort with, instructions and plan.          Discharge Prescriptions     Medication Sig Dispense Auth. Provider    HYDROmorphone (DILAUDID) 4 MG tablet Take 1 tablet (4 mg total) by mouth every 6 (six) hours as needed for Pain 12 tablet Phillis Knack, MD    ibuprofen (ADVIL) 600 MG tablet Take 1 tablet (600 mg total) by mouth every 6 (six) hours as needed for Pain 30 tablet Phillis Knack, MD    naloxone [facility]) 4 MG/0.1ML nasal spray 1 spray intranasally. If pt does not respond or relapses into respiratory depression call 911. Give additional doses every 2-3 min. 2 each Phillis Knack, MD                         CLINICAL INFORMATION        HPI:      Chief Complaint: Back Pain and Post-op Problem  .    Liliauna Emmylou Bieker is a 39 y.o. female with PMHx of endometriosis, L1 fracture (06/02) who presents with severe, persistent back pain following vertebroplasty/kyphoplasty of compression fracture of L1 (06/25) by Dr. Jimmie Molly. Pt also c/o occasional dizziness and tingling to fingers. Pt  was taking hydromorphone and ibuprofen (800mg ) for pain management, ran out of medication today. Pt sts she has been experiencing moderate pain since the surgery, but today the pain became more severe and she was unable to ambulate due to the sxs. Per EMS, pt received of fentanyl en route.     Denies paraesthesia, dysuria.      Neurologist: Jilda Roche, MD    History obtained from: Patient          ROS:      Positive and negative ROS elements as per HPI.  All other systems reviewed and negative.      Physical Exam:      Pulse 96   BP (!) 176/97   Resp 16   SpO2 99 %   Temp 98.1 F (36.7 C)    Constitutional: Well appearing. No acute distress.  Head: Normocephalic, atraumatic.  HEENT: PERRL. Neck supple.    CV: HR regular. Extremities symmetric, no edema.   Musculoskeletal: Lower back TTP  Respiratory: CTAB. No respiratory distress. Speaking  full sentences.  Abdomen: Soft. Non-tender, non-distended.   Skin: Dry. Normal temp.   Neuro: Sensation grossly intact. Face symmetric. Clear speech. Moves all extremities equally. No saddle anesthesia.   Psych: Normal affect and thought. Oriented x3               PAST HISTORY        Primary Care Provider: Renaldo Reel, MD        PMH/PSH:    .     Past Medical History:   Diagnosis Date    Ankle fracture     left    Anxiety     Boxer's fracture     right    Depression     Endometrioma     Hx of back injury        She has a past surgical history that includes Hysterectomy (2006); Cesarean section (2003); LIFT, ARM (COSMETIC) (Bilateral); ENDOMETRIOSIS SURGERY; and Vertebroplasty/Kyphoplasty (N/A, 05/08/2020).      Social/Family History:      She reports that she has been smoking cigarettes. She has never used smokeless tobacco. She reports previous alcohol use of about 1.0 standard drinks of alcohol per week. She reports that she does not use drugs.    Family History   Problem Relation Age of Onset    Dermatomyositis Mother     Diabetes Father     Heart  disease Father     Hyperlipidemia Father     Dementia Father     Diabetes Sister     Heart disease Brother     Cancer Paternal Aunt     Colon cancer Maternal Grandmother          Listed Medications on Arrival:    .     Home Medications     Med List Status: In Progress Set By: Etheleen Nicks, RN at 05/19/2020 11:29 PM                acetaminophen (TYLENOL) 325 MG tablet     Take 2 tablets (650 mg total) by mouth every 6 (six) hours as needed for Pain     amLODIPine (NORVASC) 10 MG tablet     Take 1 tablet (10 mg total) by mouth daily     butalbital-acetaminophen-caffeine (FIORICET) 50-325-40 MG per tablet     Take 1 tablet by mouth every 6 (six) hours as needed for Headaches     clonazePAM (KlonoPIN) 1 MG tablet     Take 1 mg by mouth daily        gabapentin (NEURONTIN) 300 MG capsule     Take 1 capsule (300 mg total) by mouth every 8 (eight) hours     lisinopril (ZESTRIL) 20 MG tablet     Take 1 tablet (20 mg total) by mouth daily     methocarbamol (ROBAXIN) 500 MG tablet     Take 2 tablets (1,000 mg total) by mouth every 8 (eight) hours as needed (muscle spasm)     venlafaxine (EFFEXOR-XR) 37.5 MG 24 hr capsule     Take 150 mg by mouth daily                               Allergies: She is allergic to morphine.            VISIT INFORMATION        Clinical Course in the ED:  Medications Given in the ED:    .     ED Medication Orders (From admission, onward)    Start Ordered     Status Ordering Provider    05/20/20 0029 05/20/20 0028  HYDROmorphone (DILAUDID) tablet 4 mg  Once     Route: Oral  Ordered Dose: 4 mg     Last MAR action: Given Abdulrahman Bracey J    05/19/20 2303 05/19/20 2302  HYDROmorphone (DILAUDID) injection 1 mg  Once     Route: Intravenous  Ordered Dose: 1 mg     Last MAR action: Given Henriette Hesser J    05/19/20 2303 05/19/20 2302  ketorolac (TORADOL) injection 30 mg  Once     Route: Intravenous  Ordered Dose: 30 mg     Last MAR action: Given Jessy Cybulski J    05/19/20 2218 05/19/20  2217    Once     Route: Oral  Ordered Dose: 1,000 mg     Discontinued HACISKI, STANISLAW C    05/19/20 2218 05/19/20 2217    Once     Route: Oral  Ordered Dose: 600 mg     Discontinued HACISKI, STANISLAW C    05/19/20 2218 05/19/20 2217    Once     Route: Oral  Ordered Dose: 5 mg     Discontinued HACISKI, STANISLAW C    05/19/20 2218 05/19/20 2217    Once     Route: Transdermal  Ordered Dose: 1 patch     Discontinued HACISKI, STANISLAW C            Procedures:      Procedures      Interpretations:      O2 sat-           saturation: 99 %; Oxygen use: room air; Interpretation: Normal                        RESULTS        Lab Results:      Results     ** No results found for the last 24 hours. **              Radiology Results:      Lumbar Spine AP and Lateral   Final Result         1. L1 vertebroplasty as detailed above. Stable mild height loss of the   L1 compression fracture.      2. Mild degenerative changes as detailed above.      Gaylyn Rong, MD    05/19/2020 10:56 PM                  Scribe Attestation:      I was acting as a Neurosurgeon for Phillis Knack, MD on [facility]  Oneida Arenas    I am the first provider for this patient and I personally performed the services documented. Oneida Arenas is scribing for me on [place] REN[place]. This note accurately reflects work and decisions made by me.  Phillis Knack, MD            Phillis Knack, MD  05/25/20 [phone]

## 2020-05-20 MED ORDER — HYDROMORPHONE HCL 2 MG PO TABS
4.00 mg | ORAL_TABLET | Freq: Once | ORAL | Status: AC
Start: 2020-05-20 — End: 2020-05-20
  Administered 2020-05-20: 01:00:00 4 mg via ORAL
  Filled 2020-05-20: qty 2

## 2020-05-21 ENCOUNTER — Telehealth: Payer: Self-pay | Admitting: Diagnostic Radiology

## 2020-05-21 NOTE — Telephone Encounter (Signed)
Late entry.  Pt called Tuesday afternoon complaining that she was in severe pain from her back.  She states it hurts so bad she cant walk.  I instructed her to go to the ER to be evaluated as no way of telling what is going on without an exam and imaging.  She has agreed to do so. She states she will call back with an update

## 2020-05-22 ENCOUNTER — Ambulatory Visit (INDEPENDENT_AMBULATORY_CARE_PROVIDER_SITE_OTHER): Payer: BLUE CROSS/BLUE SHIELD | Admitting: Internal Medicine

## 2020-05-22 ENCOUNTER — Encounter (INDEPENDENT_AMBULATORY_CARE_PROVIDER_SITE_OTHER): Payer: Self-pay | Admitting: Internal Medicine

## 2020-05-22 VITALS — BP 100/80 | HR 85 | Temp 97.5°F | Ht 63.0 in | Wt 236.0 lb

## 2020-05-22 DIAGNOSIS — G8929 Other chronic pain: Secondary | ICD-10-CM

## 2020-05-22 DIAGNOSIS — M545 Low back pain: Secondary | ICD-10-CM

## 2020-05-22 NOTE — Progress Notes (Signed)
[facility] INTERNAL MEDICINE - AN [place] [facility]NER                  Date of Exam: 05/22/2020 1:06 PM        Patient ID: Victoria Woodward is a 39 y.o. female.        Chief Complaint:    Chief Complaint   Patient presents with    Follow-up     after surgery, ( back ), multiple issues             HPI:    HPI   Pt s/p VERTEBROPLASTY/KYPHOPLASTY  05/08/20 for closed compression fracture of body of L1 vertebra went to the ER 05/19/20 for LBP. Pt received fentanyl 50 mcg in the ambulance. Pt received dilaudid 4 mg iv and toradol 30 mg iv in the ER. Pt was d/c'ed home with dilaudid 4 mg tabs and ibuprofen 600 mg tabs. Pt has appt with pain specialist this afternoon. Pain is not controlled currently. Pt requests handicap placard.          Problem List:    Patient Active Problem List   Diagnosis    Closed wedge compression fracture of L1 vertebra, initial encounter    Pain, acute due to trauma    Fall    Obese    Adjustment reaction with anxiety and depression    Closed compression fracture of body of L1 vertebra    Intractable back pain    Headache    Hypertension             Current Meds:    Outpatient Medications Marked as Taking for the 05/22/20 encounter (Office Visit) with Renaldo Reel, MD   Medication Sig Dispense Refill    acetaminophen (TYLENOL) 325 MG tablet Take 2 tablets (650 mg total) by mouth every 6 (six) hours as needed for Pain      amLODIPine (NORVASC) 10 MG tablet Take 1 tablet (10 mg total) by mouth daily 30 tablet 0    gabapentin (NEURONTIN) 300 MG capsule Take 1 capsule (300 mg total) by mouth every 8 (eight) hours 21 capsule 0    HYDROmorphone (DILAUDID) 4 MG tablet Take 1 tablet (4 mg total) by mouth every 6 (six) hours as needed for Pain 12 tablet 0    ibuprofen (ADVIL) 600 MG tablet Take 1 tablet (600 mg total) by mouth every 6 (six) hours as needed for Pain 30 tablet 0    lisinopril (ZESTRIL) 20 MG tablet Take 1 tablet (20 mg total) by mouth daily 30 tablet 0    naloxone  (NARCAN) 4 MG/0.1ML nasal spray 1 spray intranasally. If pt does not respond or relapses into respiratory depression call 911. Give additional doses every 2-3 min. 2 each 0    venlafaxine (EFFEXOR-XR) 37.5 MG 24 hr capsule Take 150 mg by mouth daily              Allergies:    Allergies   Allergen Reactions    Morphine Itching              Social History:    Social History     Tobacco Use    Smoking status: Current Every Day Smoker     Types: Cigarettes    Smokeless tobacco: Never Used   Vaping Use    Vaping Use: Never used   Substance Use Topics    Alcohol use: Not Currently     Alcohol/week: 1.0 standard drinks     Types: 1  Standard drinks or equivalent per week    Drug use: Never           The following sections were reviewed this encounter by the provider:   Allergies   Meds              Vital Signs:    Vitals:    05/22/20 1145   BP: 100/80   Pulse: 85   Temp: 97.5 F (36.4 C)   SpO2: 96%   Weight: 107 kg (236 lb)   Height: 1.6 m (5\' 3" )             ROS:    See HPI.           Physical Exam:    Physical Exam  Constitutional:       General: She is not in acute distress.     Appearance: Normal appearance. She is not ill-appearing.   HENT:      Head: Normocephalic and atraumatic.   Cardiovascular:      Rate and Rhythm: Normal rate and regular rhythm.      Pulses: Normal pulses.      Heart sounds: Normal heart sounds.   Pulmonary:      Effort: Pulmonary effort is normal.      Breath sounds: Normal breath sounds.   Musculoskeletal:      Cervical back: Neck supple.   Lymphadenopathy:      Cervical: No cervical adenopathy.   Neurological:      Mental Status: She is alert.      Comments: Pt is using walker to ambulate d/t LBP.              Assessment/Plan:    1. Chronic midline low back pain without sciatica  d/t closed compression fracture of body of L1 vertebra> VERTEBROPLASTY/KYPHOPLASTY  05/08/20. Pt was seen in the ER 05/19/20, given pain medicine, and has appt with pain specialist today.   - I completed form  for handicap placard for 6 months.  - agree with referral to pain specialist.             Follow-up:    Return for keep scheduled follow up appointment.         Renaldo Reel, MD

## 2020-05-25 ENCOUNTER — Telehealth: Payer: Self-pay | Admitting: Diagnostic Radiology

## 2020-05-25 NOTE — Telephone Encounter (Signed)
Pt called today asking if short term paperwork could be filled out by Dr Jimmie Molly because pain management whom she was referred and is seeing her does not do the paperwork.  She states that she continues with pain in her lower back with pain down to both buttocks.  She can only stand for 10 min and unable to sit longer than an hour at the most.  She states that she takes ibuprofen, gabapentin, dilaudid and was given a muscle relaxant that she hasn't started taking yet. She will try the muscle relaxant, ibuprofen and heat to see if this helps. She says she has a follow up with pain management in 10 days. She will discuss the paperwork with her PMD, as I informed her we don't typically fill these out for vertebroplasty patients as they go to pain management if the vertebroplasty doesn't work.

## 2020-07-06 ENCOUNTER — Ambulatory Visit (INDEPENDENT_AMBULATORY_CARE_PROVIDER_SITE_OTHER): Payer: BLUE CROSS/BLUE SHIELD | Admitting: Internal Medicine

## 2020-07-06 ENCOUNTER — Encounter (INDEPENDENT_AMBULATORY_CARE_PROVIDER_SITE_OTHER): Payer: Self-pay | Admitting: Internal Medicine

## 2020-07-06 VITALS — BP 126/84 | Temp 98.1°F | Ht 63.75 in | Wt 225.0 lb

## 2020-07-06 DIAGNOSIS — G8929 Other chronic pain: Secondary | ICD-10-CM

## 2020-07-06 DIAGNOSIS — Z1231 Encounter for screening mammogram for malignant neoplasm of breast: Secondary | ICD-10-CM

## 2020-07-06 DIAGNOSIS — M546 Pain in thoracic spine: Secondary | ICD-10-CM

## 2020-07-06 DIAGNOSIS — Z0000 Encounter for general adult medical examination without abnormal findings: Secondary | ICD-10-CM

## 2020-07-06 LAB — COMPREHENSIVE METABOLIC PANEL
ALT: 11 U/L (ref 0–55)
AST (SGOT): 10 U/L (ref 5–34)
Albumin/Globulin Ratio: 1.1 (ref 0.9–2.2)
Albumin: 3.9 g/dL (ref 3.5–5.0)
Alkaline Phosphatase: 100 U/L (ref 37–106)
Anion Gap: 11 (ref 5.0–15.0)
BUN: 15 mg/dL (ref 7.0–19.0)
Bilirubin, Total: 0.3 mg/dL (ref 0.2–1.2)
CO2: 24 mEq/L (ref 21–29)
Calcium: 8.9 mg/dL (ref 8.5–10.5)
Chloride: 105 mEq/L (ref 100–111)
Creatinine: 1.1 mg/dL (ref 0.4–1.5)
Globulin: 3.4 g/dL (ref 2.0–3.7)
Glucose: 89 mg/dL (ref 70–100)
Potassium: 3.9 mEq/L (ref 3.5–5.1)
Protein, Total: 7.3 g/dL (ref 6.0–8.3)
Sodium: 140 mEq/L (ref 136–145)

## 2020-07-06 LAB — HEMOLYSIS INDEX: Hemolysis Index: 3 (ref 0–18)

## 2020-07-06 LAB — TSH: TSH: 0.46 u[IU]/mL (ref 0.35–4.94)

## 2020-07-06 LAB — GFR: EGFR: >60

## 2020-07-06 NOTE — Progress Notes (Signed)
-   Are you currently experiencing fever, cough, or shortness of breath? :: YES (cough since 05/09/2020)    - Are you currently experiencing chills, sore throat, new headache, loss of taste/smell, or body aches not attributable to physical activity, injury, or pre-existing conditions? :: YES (body aches)    - Have you had prolonged close contact with a known COVID-19 patient? :: NO    - Have you recently been tested for COVID-19? :: NO    - Have you been diagnosed with COVID-19? :: NO    - Do you live in a group living residence, such as an assisted living facility, nursing home, shelter, or dormitory? :: NO    - Do you work or Agricultural consultant in a healthcare setting? :: NO

## 2020-07-06 NOTE — Progress Notes (Signed)
[facility] INTERNAL MEDICINE - AN [place] [employer]NER                 Date of Exam: 07/06/2020 9:53 AM        Patient ID: Victoria Woodward is a 39 y.o. female.        Chief Complaint:    Chief Complaint   Patient presents with    Annual Exam               HPI:    HPI   Pt c/o back pain from neck to buttock. Pain was controlled until she ran out of medicines. [facility] in 2 pain medicines for pt last week, but she has not picked up scripts yet.    Visit Type: Health Maintenance Visit  Dental: dentist visit > 1 year ago  Vision: eye exam > 1 year ago  Hearing: normal hearing  Immunization Status: covid-19  Reproductive Health: sexually active  Safety Elements Used: uses seat belts, smoke detectors in household and does not text and drive            Problem List:    Patient Active Problem List   Diagnosis    Fall    Class 2 obesity due to excess calories without serious comorbidity with body mass index (BMI) of 38.0 to 38.9 in adult    Adjustment reaction with anxiety and depression    Closed compression fracture of body of L1 vertebra    Intractable back pain    Headache    Hypertension             Current Meds:    Outpatient Medications Marked as Taking for the 07/06/20 encounter (Office Visit) with Renaldo Reel, MD   Medication Sig Dispense Refill    butalbital-acetaminophen-caffeine (FIORICET) 50-325-40 MG per tablet Take 1 tablet by mouth every 6 (six) hours as needed for Headaches 20 tablet 0    gabapentin (NEURONTIN) 100 MG capsule 100 mg daily        gabapentin (NEURONTIN) 300 MG capsule Take 1 capsule (300 mg total) by mouth every 8 (eight) hours 21 capsule 0    naloxone (NARCAN) 4 MG/0.1ML nasal spray 1 spray intranasally. If pt does not respond or relapses into respiratory depression call 911. Give additional doses every 2-3 min. 2 each 0    venlafaxine (EFFEXOR-XR) 37.5 MG 24 hr capsule Take 150 mg by mouth daily        [DISCONTINUED]  HYDROmorphone (DILAUDID) 4 MG tablet Take 1 tablet (4 mg total) by mouth every 6 (six) hours as needed for Pain 12 tablet 0          Allergies:    Allergies   Allergen Reactions    Morphine Itching             Past Surgical History:    Past Surgical History:   Procedure Laterality Date    CESAREAN SECTION  2003    ENDOMETRIOSIS SURGERY      had endometrioma removed from abdominal wall with mesh placement    HYSTERECTOMY  2006    LIFT, ARM (COSMETIC) Bilateral     Breast    VERTEBROPLASTY/KYPHOPLASTY N/A 05/08/2020    Procedure: VERTEBROPLASTY/KYPHOPLASTY;  Surgeon: Shon Millet, MD;  Location: [facility];  Service: Interventional Radiology;  Laterality: N/A;           Family History:    Family History   Problem Relation Age  of Onset    Dermatomyositis Mother     Diabetes Father     Heart disease Father     Hyperlipidemia Father     Dementia Father     Diabetes Sister     Heart disease Brother     Cancer Paternal Aunt     Colon cancer Maternal Grandmother            Social History:    Social History     Socioeconomic History    Marital status: Widowed     Spouse name: None    Number of children: 3    Years of education: None    Highest education level: None   Occupational History    Occupation: Firefighter     Comment: Full-time   Tobacco Use    Smoking status: Current Every Day Smoker     Types: Cigars    Smokeless tobacco: Never Used    Tobacco comment: 1 "black" per day   Vaping Use    Vaping Use: Never used   Substance and Sexual Activity    Alcohol use: Not Currently    Drug use: Yes     Types: Marijuana     Comment: daily    Sexual activity: Yes     Partners: Male   Other Topics Concern    None   Social History Narrative    Nutrition:  Non-specific diet    Caffeine Intake:  None    Exercise:  None     Social Determinants of Psychologist, prison and probation services Strain:     Difficulty of Paying Living Expenses:    Food Insecurity:     Worried About Programme researcher, broadcasting/film/video in the Last  Year:     Barista in the Last Year:    Transportation Needs:     Freight forwarder (Medical):     Lack of Transportation (Non-Medical):    Physical Activity:     Days of Exercise per Week:     Minutes of Exercise per Session:    Stress:     Feeling of Stress :    Social Connections:     Frequency of Communication with Friends and Family:     Frequency of Social Gatherings with Friends and Family:     Attends Religious Services:     Active Member of Clubs or Organizations:     Attends Banker Meetings:     Marital Status:    Intimate Partner Violence:     Fear of Current or Ex-Partner:     Emotionally Abused:     Physically Abused:     Sexually Abused:            The following sections were reviewed this encounter by the provider:   Allergies   Meds                Vital Signs:    Vitals:    07/06/20 0837   BP: 126/84   BP Site: Left arm   Temp: 98.1 F (36.7 C)   TempSrc: Temporal   Weight: 102.1 kg (225 lb)   Height: 1.619 m (5' 3.75")            ROS:    Review of Systems    General/Constitutional:   Denies Change in appetite, Chills, Fatigue, Fever.   Ophthalmologic:   Denies Blurred vision, Eye Discharge, Eye Pain.   ENT:   Denies Nasal  Discharge, Hoarseness, Ear pain, Sinus pain, Sore throat.   Endocrine:   Denies Polydipsia, Polyuria, Weakness.   Respiratory:   Denies Paroxysmal Nocturnal Dyspnea, Orthopnea, Shortness of breath, Daytime Hypersomnolence, Snoring, Witness Apnea, Wheezing.   Cardiovascular:   Denies Chest pain, Chest pain with exertion, Leg Claudication, Palpitations, Swelling in hands/feet.   Gastrointestinal:   Denies Abdominal pain, Blood in stool, Constipation, Diarrhea, Heartburn, Nausea, Vomiting.   Hematology:   Denies Easy bruising, Easy Bleeding, Swollen glands.   Genitourinary:   Denies Blood in urine, Nocturia, Painful urination.   Musculoskeletal:   Denies Joint pain, Joint stiffness, Swollen joints, Leg cramps, Muscle aches, Weakness in LE,   Weakness in UE.   Skin:   Denies Itching, Rash.   Neurologic:   Denies Balance difficulty, Dizziness, Gait abnormality, Headache, Pre-Syncope, Memory loss, Tingling/Numbness.   Psychiatric:   Denies Anxiety, Depressed mood, Difficulty sleeping.              Physical Exam:    Physical Exam    GENERAL APPEARANCE: alert, in no acute distress, obese, oriented to time, place, and person.   HEAD: normal appearance, atraumatic.   EYES: extraocular movement intact (EOMI), pupils equal, round, reactive to light and accommodation, sclera anicteric, conjunctiva clear.   EARS: tympanic membranes normal bilaterally, external canals normal .   NOSE: normal nasal mucosa, no lesions.   ORAL CAVITY: normal oropharynx, normal lips, mucosa moist, no lesions.   THROAT: normal appearance, clear, no erythema.   NECK/THYROID: neck supple, no carotid bruit, carotid pulse 2+ bilaterally, no cervical lymphadenopathy, no neck mass palpated, no jugular venous distention, no thyromegaly.   LYMPH NODES: no palpable adenopathy.   SKIN: good turgor, no rashes, no suspicious lesions.   HEART: S1, S2 normal, no murmurs/rubs/gallops, regular rate and rhythm.   LUNGS: normal effort / no distress, normal breath sounds, clear to auscultation bilaterally, no wheezes/rales/rhonchi.   ABDOMEN: bowel sounds present, no hepatosplenomegaly, soft, nontender, nondistended.   MUSCULOSKELETAL: full range of motion, no swelling or deformity.   BACK MUSCLES: tender to palpation, no muscles tightness.  EXTREMITIES: no clubbing, cyanosis, or edema.   PERIPHERAL PULSES: 2+ dorsalis pedis, 2+ posterior tibial.   NEUROLOGIC: nonfocal, cranial nerves 2-12 grossly intact, deep tendon reflexes 2+ symmetrical, normal strength, tone and reflexes, sensory exam intact.   PSYCH: cognitive function intact, mood/affect full range, speech clear.             Health Maintenance   Topic Date Due    MAMMOGRAM  Never done    [facility] CARE PLAN LETTER  Never done    COVID-19 Vaccine (1)  Never done    HIGH RISK PNEUMONIA  Never done    INFLUENZA VACCINE  06/14/2020    DEPRESSION SCREENING  07/06/2021    Annual Exam  07/06/2021    PAP SMEAR  Discontinued       Assessment/Plan:    Health Maintenance:  Recommend optimizing low carbohydrate diet efforts and obtaining at least 150 minutes of aerobic exercise per week. Recommend 20-25 grams of dietary fiber daily. Recommend drinking at least 60-80 ounces of water per day. Recommend optimizing low sodium diet measures ( less than 2 grams of sodium in the diet per day ).  Vision screening is due. Dental Screening is due. Gynecology surveillance is due.  Recommend follow-up with gynecology at earliest convenience.    Problem List Items Addressed This Visit     None      Visit Diagnoses  Annual physical exam    -  Primary  Improve diet/limit calories to 1400 daily and start regular exercise in order to lose wt.    Relevant Orders    TSH    Comprehensive metabolic panel    Chronic bilateral thoracic back pain      And LBP s/p fall resulting in L1 vertebral fx [facility]  - pick up medicines from pharmacy and start  - salonpas lidocaine patch prn  - pt has f/u with pain specialist later this month    Relevant Medications    Cont gabapentin (NEURONTIN) 100 MG capsule AM and 300 mg PM      Encounter for screening mammogram for breast cancer        Relevant Orders    Mammo Digital Screening Bilateral W Cad                     Follow-up:    Return in about 3 months (around 10/06/2020) for back pain.         Renaldo Reel, MD

## 2020-12-09 ENCOUNTER — Ambulatory Visit (INDEPENDENT_AMBULATORY_CARE_PROVIDER_SITE_OTHER): Payer: BLUE CROSS/BLUE SHIELD | Admitting: Internal Medicine

## 2020-12-09 ENCOUNTER — Encounter (INDEPENDENT_AMBULATORY_CARE_PROVIDER_SITE_OTHER): Payer: Self-pay | Admitting: Internal Medicine

## 2020-12-09 VITALS — BP 120/80 | HR 72 | Temp 98.2°F | Ht 63.0 in | Wt 204.0 lb

## 2020-12-09 DIAGNOSIS — M545 Low back pain, unspecified: Secondary | ICD-10-CM

## 2020-12-09 DIAGNOSIS — N951 Menopausal and female climacteric states: Secondary | ICD-10-CM

## 2020-12-09 LAB — FOLLICLE STIMULATING HORMONE: Follicle Stimulating Hormone: 63.58

## 2020-12-09 LAB — COMPREHENSIVE METABOLIC PANEL
ALT: 13 U/L (ref 0–55)
AST (SGOT): 14 U/L (ref 5–34)
Albumin/Globulin Ratio: 1.2 (ref 0.9–2.2)
Albumin: 4.1 g/dL (ref 3.5–5.0)
Alkaline Phosphatase: 88 U/L (ref 37–117)
Anion Gap: 8 (ref 5.0–15.0)
BUN: 10 mg/dL (ref 7.0–19.0)
Bilirubin, Total: 0.4 mg/dL (ref 0.2–1.2)
CO2: 27 mEq/L (ref 21–29)
Calcium: 9.5 mg/dL (ref 8.5–10.5)
Chloride: 101 mEq/L (ref 100–111)
Creatinine: 0.8 mg/dL (ref 0.4–1.5)
Globulin: 3.5 g/dL (ref 2.0–3.7)
Glucose: 81 mg/dL (ref 70–100)
Potassium: 4.2 mEq/L (ref 3.5–5.1)
Protein, Total: 7.6 g/dL (ref 6.0–8.3)
Sodium: 136 mEq/L (ref 136–145)

## 2020-12-09 LAB — TSH: TSH: 0.41 u[IU]/mL (ref 0.35–4.94)

## 2020-12-09 LAB — TESTOSTERONE: Testosterone: 40 ng/dL (ref 14–76)

## 2020-12-09 LAB — LUTEINIZING HORMONE: LH: 17.5

## 2020-12-09 LAB — GFR: EGFR: >60

## 2020-12-09 LAB — PROGESTERONE: Progesterone: <0.5 ng/mL

## 2020-12-09 LAB — ESTRADIOL: Estradiol: 26 pg/mL

## 2020-12-09 LAB — HEMOLYSIS INDEX: Hemolysis Index: 3 (ref 0–24)

## 2020-12-09 MED ORDER — NABUMETONE 750 MG PO TABS
750.0000 mg | ORAL_TABLET | Freq: Two times a day (BID) | ORAL | 1 refills | Status: DC
Start: 2020-12-09 — End: 2022-03-28

## 2020-12-09 NOTE — Progress Notes (Signed)
[facility] INTERNAL MEDICINE - AN [place] PARTNER                  Date of Exam: 12/09/2020 4:21 PM        Patient ID: Victoria Woodward is a 40 y.o. female.        Chief Complaint:    Chief Complaint   Patient presents with    Follow-up     back surgery    Night Sweats             HPI:    HPI   Pt c/o low back pain x several weeks. Pt had L1 vertebroplasty 05/08/20. Denies trauma, radiation, weakness, numbness, B/B incontinence. Pt took motrin and tylenol with little relief.    Next, pt c/o intermittent sweating x years which is worse x past 4 weeks. Pt had partial hysterectomy 2006 so does not menstruate.                 Problem List:    Patient Active Problem List   Diagnosis    Fall    Class 2 obesity due to excess calories without serious comorbidity with body mass index (BMI) of 38.0 to 38.9 in adult    Adjustment reaction with anxiety and depression    Closed compression fracture of body of L1 vertebra    Intractable back pain    Headache    Hypertension             Current Meds:    Outpatient Medications Marked as Taking for the 12/09/20 encounter (Office Visit) with Renaldo Reel, MD   Medication Sig Dispense Refill    ALPRAZolam (XANAX) 1 MG tablet       gabapentin (NEURONTIN) 300 MG capsule Take 1 capsule (300 mg total) by mouth every 8 (eight) hours 21 capsule 0    naltrexone (REVIA) 50 MG tablet Take 50 mg by mouth daily      venlafaxine (EFFEXOR-XR) 150 MG 24 hr capsule       ziprasidone (GEODON) 20 MG capsule       [DISCONTINUED] ALPRAZolam (XANAX) 0.5 MG tablet 1 TAB 2 TIMES DAY            Allergies:    Allergies   Allergen Reactions    Morphine Itching              Social History:    Social History     Tobacco Use    Smoking status: Current Every Day Smoker     Types: Cigars    Smokeless tobacco: Never Used    Tobacco comment: 1 "black" per day   Vaping Use    Vaping Use: Never used   Substance Use Topics    Alcohol use: Not Currently    Drug use: Yes     Types:  Marijuana     Comment: daily            The following sections were reviewed this encounter by the provider: Meds     Problems              Vital Signs:    Vitals:    12/09/20 1434   BP: 120/80   Pulse: 72   Temp: 98.2 F (36.8 C)   SpO2: 95%   Weight: 92.5 kg (204 lb)   Height: 1.6 m (5\' 3" )             ROS:    Review of Systems   Constitutional:  Negative for fever.   Respiratory: Negative for cough, chest tightness, shortness of breath and wheezing.    Cardiovascular: Negative for chest pain and palpitations.              Physical Exam:    Physical Exam  Constitutional:       General: She is not in acute distress.     Appearance: Normal appearance.   HENT:      Head: Normocephalic and atraumatic.   Neck:      Vascular: No carotid bruit.   Cardiovascular:      Rate and Rhythm: Normal rate and regular rhythm.      Pulses: Normal pulses.      Heart sounds: Normal heart sounds.   Pulmonary:      Effort: Pulmonary effort is normal. No respiratory distress.      Breath sounds: Normal breath sounds.   Musculoskeletal:      Cervical back: Neck supple.      Comments: +exquisite tenderness to palpation of lumbar spine.   Neurological:      Mental Status: She is alert.      Cranial Nerves: No cranial nerve deficit.      Motor: No weakness.      Coordination: Coordination normal.      Gait: Gait normal.      Comments: Neg straight leg lift.               Assessment/Plan:    1. Acute midline low back pain without sciatica  - proper posture  - heat alt ice locally  - d/c otc NSAID  -lidocaine patch 12 hrs daily to back   X-ray Lumbar Spine AP and Lateral; Future  - nabumetone (RELAFEN) 750 MG tablet; Take 1 tablet (750 mg total) by mouth 2 (two) times daily  Dispense: 60 tablet; Refill: 1  - f/u with surgeon if above does not resolve LBP.    2. Sweats, menopausal  - soy or black cohosh prn  - Comprehensive metabolic panel  - TSH  - Estradiol  - Progesterone  - Follicle stimulating hormone  - Luteinizing hormone  -  Testosterone  - DHEA-sulfate               Follow-up:    No follow-ups on file.         Renaldo Reel, MD

## 2020-12-11 ENCOUNTER — Encounter (INDEPENDENT_AMBULATORY_CARE_PROVIDER_SITE_OTHER): Payer: Self-pay | Admitting: Internal Medicine

## 2020-12-11 LAB — DHEA-SULFATE: DHEA-Sulfate: 237 (ref 45–295)

## 2021-01-13 ENCOUNTER — Telehealth (INDEPENDENT_AMBULATORY_CARE_PROVIDER_SITE_OTHER): Payer: Self-pay | Admitting: Internal Medicine

## 2021-01-13 NOTE — Telephone Encounter (Signed)
Pt called    She's nauseous would like an rx please send to [id number]    [phone]

## 2021-01-13 NOTE — Telephone Encounter (Signed)
Pt states that she mention nausea to you at the last ov when we drew blood for the menopause test

## 2021-01-13 NOTE — Telephone Encounter (Signed)
See below

## 2021-01-13 NOTE — Telephone Encounter (Signed)
Pt must schedule appt for evalution.

## 2021-01-18 ENCOUNTER — Telehealth (INDEPENDENT_AMBULATORY_CARE_PROVIDER_SITE_OTHER): Payer: Self-pay | Admitting: Internal Medicine

## 2021-01-18 ENCOUNTER — Other Ambulatory Visit (INDEPENDENT_AMBULATORY_CARE_PROVIDER_SITE_OTHER): Payer: Self-pay | Admitting: Internal Medicine

## 2021-01-18 DIAGNOSIS — R110 Nausea: Secondary | ICD-10-CM

## 2021-01-18 MED ORDER — ONDANSETRON 4 MG PO TBDP
4.00 mg | ORAL_TABLET | Freq: Three times a day (TID) | ORAL | 0 refills | Status: AC | PRN
Start: 2021-01-18 — End: 2021-02-17

## 2021-01-18 NOTE — Telephone Encounter (Signed)
Pt called    Still waiting to hear back about nausea, please call back    [phone]

## 2021-01-18 NOTE — Telephone Encounter (Signed)
I sent script for zofran to pharmacy.

## 2021-01-18 NOTE — Telephone Encounter (Signed)
Pt aware that she need appt, but pt states that she mention nausea to you when we did bloodwork for menopause

## 2021-01-18 NOTE — Telephone Encounter (Signed)
Lm on pt voicemail

## 2021-02-02 ENCOUNTER — Encounter (INDEPENDENT_AMBULATORY_CARE_PROVIDER_SITE_OTHER): Payer: Self-pay | Admitting: Internal Medicine

## 2021-07-08 ENCOUNTER — Encounter (INDEPENDENT_AMBULATORY_CARE_PROVIDER_SITE_OTHER): Payer: BLUE CROSS/BLUE SHIELD | Admitting: Internal Medicine

## 2021-12-31 ENCOUNTER — Encounter (INDEPENDENT_AMBULATORY_CARE_PROVIDER_SITE_OTHER): Payer: BLUE CROSS/BLUE SHIELD | Admitting: Internal Medicine

## 2022-03-28 ENCOUNTER — Telehealth (INDEPENDENT_AMBULATORY_CARE_PROVIDER_SITE_OTHER): Payer: Self-pay | Admitting: Internal Medicine

## 2022-03-28 ENCOUNTER — Encounter (INDEPENDENT_AMBULATORY_CARE_PROVIDER_SITE_OTHER): Payer: Self-pay | Admitting: Internal Medicine

## 2022-03-28 ENCOUNTER — Telehealth (INDEPENDENT_AMBULATORY_CARE_PROVIDER_SITE_OTHER): Payer: BLUE CROSS/BLUE SHIELD | Admitting: Internal Medicine

## 2022-03-28 ENCOUNTER — Ambulatory Visit (INDEPENDENT_AMBULATORY_CARE_PROVIDER_SITE_OTHER): Payer: BLUE CROSS/BLUE SHIELD | Admitting: Family

## 2022-03-28 DIAGNOSIS — Z6841 Body Mass Index (BMI) 40.0 and over, adult: Secondary | ICD-10-CM

## 2022-03-28 DIAGNOSIS — F329 Major depressive disorder, single episode, unspecified: Secondary | ICD-10-CM

## 2022-03-28 DIAGNOSIS — F419 Anxiety disorder, unspecified: Secondary | ICD-10-CM | POA: Insufficient documentation

## 2022-03-28 NOTE — Progress Notes (Signed)
Telemedicine Documentation Requirements    Originating site (Patient location): home  Distant site (Provider location):clinic  Provider and Title: Campbell Stall MD    Consent obtained: yes   Subjective:      Date: 03/28/2022 3:07 PM   Patient ID: Victoria Woodward is a 41 y.o. female.    Chief Complaint:  No chief complaint on file.      HPI:  HPI   has lost 60 p from 2015 but recently she is not able to lose more weight and would like to have weight loss procedure asking for a referral  .   Pt is seeing psychologist and psychiatrist for depression and ADHD and another diagnosis that she doesn't remember.   Pt asks to remove few medications including Naltrexone  .  Problem List:  Patient Active Problem List   Diagnosis    Fall    Class 2 obesity due to excess calories without serious comorbidity with body mass index (BMI) of 38.0 to 38.9 in adult    Adjustment reaction with anxiety and depression    Closed compression fracture of body of L1 vertebra    Intractable back pain    Headache    Hypertension       Current Medications:  No outpatient medications have been marked as taking for the 03/28/22 encounter (Appointment) with Campbell Stall, MD.         Allergies:  Allergies   Allergen Reactions    Morphine Itching       Past Medical History:  Past Medical History:   Diagnosis Date    Ankle fracture     left    Anxiety     Boxer's fracture     right    Depression     Endometrioma     Hx of back injury            Social History:  Social History     Tobacco Use    Smoking status: Every Day     Types: Cigars    Smokeless tobacco: Never    Tobacco comments:     1 "black" per day   Vaping Use    Vaping status: Never Used   Substance Use Topics    Alcohol use: Not Currently    Drug use: Yes     Types: Marijuana     Comment: daily          The following sections were reviewed this encounter by the provider:        ROS:  Review of Systems     Constitutional: Negative for chills and fever.   Respiratory: Negative for  apnea, SOB,cough, chest tightness and wheezing.    Cardiovascular: Negative for chest pain, palpitations and leg swelling.   Gastrointestinal: Negative for abdominal pain and distension.  See HPI \  Objective:     Vitals:  There were no vitals taken for this visit.    Physical Exam:  Physical Exam  Physical Exam  Constitutional:       General: pt is not in acute distress.     Appearance: Normal appearance. pt is not ill-appearing.   Neurological:      Mental Status: pt  is alert and oriented to person, place, and time.      Assessment/Plan:     1. Class 3 severe obesity due to excess calories without serious comorbidity with body mass index (BMI) of 40.0 to 44.9 in adult  Pt is asking for referral to  have bariatric procedure .She states she has had several diet and her weight has not changed recently.  - Ambulatory Referral to Weight Loss Services; Future    BMI is 41 per pt .      2. Anxiety    3. Major depressive disorder, remission status unspecified, unspecified whether recurrent  Stable the patient is follow up with psychiatrist       Advised pt to RTC for annual physical .   Pt verbalized understanding and agreed with the plan.    Campbell StallShiva Viren Lebeau, MD

## 2022-03-28 NOTE — Telephone Encounter (Signed)
Writer called patient and no answer.  Left message on voicemail.  Patient to call back office.

## 2022-04-07 ENCOUNTER — Other Ambulatory Visit: Payer: Self-pay | Admitting: Family Medicine

## 2022-04-07 DIAGNOSIS — M25519 Pain in unspecified shoulder: Secondary | ICD-10-CM

## 2022-04-07 DIAGNOSIS — M546 Pain in thoracic spine: Secondary | ICD-10-CM

## 2022-04-07 DIAGNOSIS — M47812 Spondylosis without myelopathy or radiculopathy, cervical region: Secondary | ICD-10-CM

## 2022-04-07 DIAGNOSIS — M62838 Other muscle spasm: Secondary | ICD-10-CM

## 2022-04-07 DIAGNOSIS — M542 Cervicalgia: Secondary | ICD-10-CM

## 2022-04-13 ENCOUNTER — Ambulatory Visit (INDEPENDENT_AMBULATORY_CARE_PROVIDER_SITE_OTHER): Payer: BLUE CROSS/BLUE SHIELD | Admitting: Internal Medicine

## 2022-04-13 ENCOUNTER — Encounter (INDEPENDENT_AMBULATORY_CARE_PROVIDER_SITE_OTHER): Payer: Self-pay | Admitting: Internal Medicine

## 2022-04-13 VITALS — BP 133/84 | HR 81 | Temp 98.2°F | Ht 64.17 in | Wt 236.8 lb

## 2022-04-13 DIAGNOSIS — Z0000 Encounter for general adult medical examination without abnormal findings: Secondary | ICD-10-CM

## 2022-04-13 DIAGNOSIS — Z1159 Encounter for screening for other viral diseases: Secondary | ICD-10-CM

## 2022-04-13 DIAGNOSIS — Z6841 Body Mass Index (BMI) 40.0 and over, adult: Secondary | ICD-10-CM

## 2022-04-13 DIAGNOSIS — Z1231 Encounter for screening mammogram for malignant neoplasm of breast: Secondary | ICD-10-CM

## 2022-04-13 LAB — BASIC METABOLIC PANEL
Anion Gap: 10 (ref 5.0–15.0)
BUN: 15 mg/dL (ref 7.0–21.0)
CO2: 25 mEq/L (ref 17–29)
Calcium: 9.1 mg/dL (ref 8.5–10.5)
Chloride: 106 mEq/L (ref 99–111)
Creatinine: 1 mg/dL (ref 0.4–1.0)
Glucose: 91 mg/dL (ref 70–100)
Potassium: 4.7 mEq/L (ref 3.5–5.3)
Sodium: 141 mEq/L (ref 135–145)
eGFR: >60 mL/min/{1.73_m2} (ref >=60)

## 2022-04-13 LAB — CBC
Absolute NRBC: 0 10*3/uL (ref 0.00–0.00)
Hematocrit: 39.2 % (ref 34.7–43.7)
Hgb: 12.9 g/dL (ref 11.4–14.8)
MCH: 29.5 pg (ref 25.1–33.5)
MCHC: 32.9 g/dL (ref 31.5–35.8)
MCV: 89.7 fL (ref 78.0–96.0)
MPV: 10.9 fL (ref 8.9–12.5)
Nucleated RBC: 0 /100 WBC (ref 0.0–0.0)
Platelets: 246 10*3/uL (ref 142–346)
RBC: 4.37 10*6/uL (ref 3.90–5.10)
RDW: 14 % (ref 11–15)
WBC: 6.01 10*3/uL (ref 3.10–9.50)

## 2022-04-13 LAB — HEPATITIS C ANTIBODY: Hepatitis C, AB: NONREACTIVE

## 2022-04-13 LAB — LIPID PANEL
Cholesterol / HDL Ratio: 4.9 Index
Cholesterol: 321 mg/dL — ABNORMAL HIGH (ref 0–199)
HDL: 65 mg/dL (ref 40–9999)
LDL Calculated: 240 mg/dL — ABNORMAL HIGH (ref 0–99)
Triglycerides: 80 mg/dL (ref 34–149)
VLDL Calculated: 16 mg/dL (ref 10–40)

## 2022-04-13 LAB — TSH: TSH: 0.51 u[IU]/mL (ref 0.35–4.94)

## 2022-04-13 LAB — BILIRUBIN, TOTAL AND DIRECT
Bilirubin Direct: 0.1 mg/dL (ref 0.0–0.5)
Bilirubin Indirect: 0.1 mg/dL — ABNORMAL LOW (ref 0.2–1.0)
Bilirubin, Total: 0.2 mg/dL (ref 0.2–1.2)

## 2022-04-13 LAB — HEMOLYSIS INDEX: Hemolysis Index: 24 Index (ref 0–24)

## 2022-04-13 LAB — ALT: ALT: 61 U/L — ABNORMAL HIGH (ref 0–55)

## 2022-04-13 LAB — ALKALINE PHOSPHATASE: Alkaline Phosphatase: 106 U/L (ref 37–117)

## 2022-04-13 LAB — T4, FREE: T4 Free: 0.91 ng/dL (ref 0.69–1.48)

## 2022-04-13 NOTE — Progress Notes (Signed)
Subjective:      Date: 04/13/2022 2:30 PM   Patient ID: Victoria Woodward is a 41 y.o. female.    Chief Complaint:  Chief Complaint   Patient presents with    Annual Exam     Pt is fasting        HPI  Visit Type: Health Maintenance Visit  Work Status: working full-time  Reported Diet:  no  Reported Exercise: none  Dental: regular dental visits twice a year  Vision: glasses and eye exam < 1 year ago  Hearing: normal hearing  Immunization Status: Tdap vaccination due  Reproductive Health: sexually active and History of STD: negative  Prior Screening Tests: pap smear within past 3-5 years and no previous mammogram  General Health Risks: no family history of prostate cancer, family history of colon cancer, and no family history of breast cancer      Problem List:  Patient Active Problem List   Diagnosis    Fall    Class 2 obesity due to excess calories without serious comorbidity with body mass index (BMI) of 38.0 to 38.9 in adult    Adjustment reaction with anxiety and depression    Closed compression fracture of body of L1 vertebra    Intractable back pain    Headache    Hypertension    Anxiety    Major depressive disorder       Current Medications:  Outpatient Medications Marked as Taking for the 04/13/22 encounter (Office Visit) with Campbell Stall, MD   Medication Sig Dispense Refill    ALPRAZolam (XANAX) 1 MG tablet Take 1 tablet (1 mg) by mouth 2 (two) times daily      Amphetamine-Dextroamphetamine (ADDERALL PO) Take 30 mg by mouth 2 (two) times daily      DULoxetine (Cymbalta) 20 MG capsule Take 1 capsule (20 mg) by mouth daily      gabapentin (NEURONTIN) 300 MG capsule Take 1 capsule (300 mg total) by mouth every 8 (eight) hours (Patient taking differently: Take 1 capsule (300 mg) by mouth nightly as needed (insomnia) 3 tablets at night) 21 capsule 0    ziprasidone (GEODON) 20 MG capsule Take 1 capsule (20 mg) by mouth nightly         Allergies:  Allergies   Allergen Reactions    Morphine Itching       Past  Medical History:  Past Medical History:   Diagnosis Date    ADHD     Ankle fracture     left    Anxiety     Anxiety     Boxer's fracture     right    Depression     Endometriosis 2006    s/p mpartial hysterectomy    Hx of back injury     Insomnia        Past Surgical History:  Past Surgical History:   Procedure Laterality Date    CESAREAN SECTION  2003    ENDOMETRIOSIS SURGERY      had endometrioma removed from abdominal wall with mesh placement    HYSTERECTOMY  2006    partial hysterectomy    LIFT, ARM (COSMETIC) Bilateral     Breast    VERTEBROPLASTY/KYPHOPLASTY N/A 05/08/2020    Procedure: VERTEBROPLASTY/KYPHOPLASTY;  Surgeon: Shon Millet, MD;  Location: [facility];  Service: Interventional Radiology;  Laterality: N/A;       Family History:  Family History   Problem Relation Age of Onset    Dermatomyositis Mother 76  Heart failure Mother     Diabetes Father     Heart disease Father         CHF    Hyperlipidemia Father     Dementia Father     Diabetes Sister     Heart disease Brother         VALVE disease    Colon cancer Maternal Grandmother         83    Cancer Paternal Aunt         unkown       Social History:  Social History     Tobacco Use    Smoking status: Former     Types: Cigars     Quit date: 2022     Years since quitting: 1.4    Smokeless tobacco: Never    Tobacco comments:     1 "black" per day   Vaping Use    Vaping status: Never Used   Substance Use Topics    Alcohol use: Not Currently    Drug use: Yes     Types: Marijuana     Comment: daily          The following sections were reviewed this encounter by the provider:   Tobacco  Allergies  Meds  Problems  Med Hx  Surg Hx  Fam Hx         ROS:   General/Constitutional:   Denies Change in appetite. Denies Chills. Denies Fatigue. Denies Fever.   Ophthalmologic:   Denies Blurred vision. Denies Eye Discharge. Denies Eye Pain.   ENT:   Denies Nasal Discharge. Denies Hoarseness. Denies Ear pain. Denies Nosebleed. Denies Sinus pain. Denies  Sore throat.   Endocrine:   Denies Decreased Libido. Denies Polydipsia. Denies Polyuria. Denies Weakness.   Respiratory:   Denies Paroxysmal Nocturnal Dyspnea. Denies Cough. Denies Orthopnea. Denies Shortness of breath. Denies Daytime Hypersomnolence. Denies Snoring. Denies Witness Apnea. Denies Wheezing.   Cardiovascular:   Denies Chest pain. Denies Chest pain with exertion. Denies Leg Claudication. Denies Palpitations. Denies Swelling in hands/feet.   Gastrointestinal:   Denies Abdominal pain. Denies Blood in stool. Denies Constipation. Denies Diarrhea. Denies Heartburn. Denies Nausea. Denies Vomiting.   Hematology:   Denies Easy bruising. Denies Easy Bleeding. Denies Swollen glands.   Genitourinary:   Denies Blood in urine. Denies Nocturia. Denies Painful urination.   Musculoskeletal:   Denies Joint pain. Denies Joint stiffness. Denies Leg cramps. Denies Muscle aches. Denies Weakness in LE. Denies Swollen joints. Denies Weakness in UE.   Skin:   Denies Itching. Denies Change in Mole(s). Denies Rash.   Neurologic:   Denies Balance difficulty. Denies Dizziness. Denies Gait abnormality. Denies Headache. Denies Pre-Syncope. Denies Memory loss. Denies Seizures. Denies Tingling/Numbness.   Psychiatric:   Denies Anxiety. Denies Depressed mood. Denies Difficulty sleeping.     Objective:     Vitals:  BP 133/84 (BP Site: Left arm)   Pulse 81   Temp 98.2 F (36.8 C)   Ht 1.63 m (5' 4.17")   Wt 107.4 kg (236 lb 12.8 oz)   SpO2 96%   BMI 40.43 kg/m   Wt Readings from Last 3 Encounters:   04/13/22 107.4 kg (236 lb 12.8 oz)   12/09/20 92.5 kg (204 lb)   07/06/20 102.1 kg (225 lb)     Temp Readings from Last 3 Encounters:   04/13/22 98.2 F (36.8 C)   12/09/20 98.2 F (36.8 C)   07/06/20 98.1 F (36.7 C) (Temporal)  BP Readings from Last 3 Encounters:   04/13/22 133/84   12/09/20 120/80   07/06/20 126/84     Pulse Readings from Last 3 Encounters:   04/13/22 81   12/09/20 72   05/22/20 85       Examination:    General Examination:   GENERAL APPEARANCE: alert, in no acute distress, well developed, well nourished, oriented to time, place, and person.   HEAD: normal appearance, atraumatic.   EYES: extraocular movement intact (EOMI), pupils equal, round, reactive to light and accommodation, sclera anicteric, conjunctiva clear.   EARS: tympanic membranes normal bilaterally, external canals normal .   ORAL CAVITY: normal oropharynx, normal lips, mucosa moist, no lesions.   THROAT: normal appearance, clear, no erythema.   NECK/THYROID: neck supple, no carotid bruit, carotid pulse 2+ bilaterally, no cervical lymphadenopathy, no neck mass palpated, no jugular venous distention, no thyromegaly.   LYMPH NODES: no palpable adenopathy.   SKIN: good turgor, no rashes, no suspicious lesions.   HEART: S1, S2 normal, no murmurs, rubs, gallops, regular rate and rhythm.   LUNGS: normal effort / no distress, normal breath sounds, clear to auscultation bilaterally, no wheezes, rales, rhonchi.   BREASTS:No palpable masses, no skin changes, no nipple inversion/retraction/discharge, no adenopathy bilaterally .    ABDOMEN: bowel sounds present, no hepatosplenomegaly, soft, nontender, nondistended.   FEMALE GENITOURINARY: deferred to gyn   MUSCULOSKELETAL: full range of motion, no swelling or deformity.   EXTREMITIES: no edema, no clubbing, cyanosis, or edema.   PERIPHERAL PULSES: 2+ dorsalis pedis, 2+ posterior tibial.   NEUROLOGIC: nonfocal, cranial nerves 2-12 grossly intact, deep tendon reflexes 2+ symmetrical, normal strength, tone and reflexes, sensory exam intact.   PSYCH: cognitive function intact, mood/affect full range, speech clear.     Assessment/Plan:           Health Maintenance:  Recommend optimizing low carbohydrate diet efforts and obtaining at least 150 minutes of aerobic exercise per week.Vision screening UTD. Dental Screening UTD. Mammogram screening is due.  Screening mammogram ordered. Gynecology surveillance is due.   Recommend follow-up with gynecology at earliest convenience. HCV screening is due.  HCV Ab ordered.  Get at least 150 minutes of moderate -intensity aerobic activity per week preferably spread throughout the week.     Advised to bring vaccination records , recommended vaccines appropriate for pt's age discussed.    Recommended vaccines :TD every 10 years , Influenza vaccine yearly, covid boosters  .  Pt is f/u with psychiatrist for refills ADHD , anxiety and depression .     Blood pressure goal is <130/80 if your blood pressure numbers are frequently over 130/80 please make a  follow up  Appointment .More information about diet and HTN was provided .advised to decrease salt intake to less than 2 g a day .      1. Annual physical exam    - Bilirubin (Fractionated), Total & Direct  - Alkaline phosphatase  - ALT  - T4, free  - TSH  - Lipid panel  - CBC without differential  - Basic Metabolic Panel  - Referral to Obstetrics/Gynecology (EXTERNAL); Future    2. Need for hepatitis C screening test    - Hepatitis C (HCV) antibody, Total    3. Breast cancer screening by mammogram    - Mammo Digital Screening Bilateral W Cad; Future    4. Class 3 severe obesity without serious comorbidity with body mass index (BMI) of 40.0 to 44.9 in adult, unspecified obesity type    -  Ambulatory Referral to Weight Loss Services; Future  Follow up with test  results .  Pt verbalized understanding and agreed with the plan.    Campbell Stall, MD

## 2022-04-13 NOTE — Progress Notes (Signed)
Have you seen any specialists/other providers since your last visit with [place]?    Yes surg    Arm preference verified?   Yes, no preference    Health Maintenance Due   Topic Date Due    Cervical Cancer Screening Three Years  Never done    MAMMOGRAM  Never done    HEPATITIS C SCREENING  Never done    HIGH RISK PNEUMONIA  Never done    COVID-19 Vaccine (3 - Booster for Moderna series) 11/18/2020    Tetanus Ten-Year  05/28/2021

## 2022-04-14 ENCOUNTER — Encounter (INDEPENDENT_AMBULATORY_CARE_PROVIDER_SITE_OTHER): Payer: Self-pay | Admitting: Internal Medicine

## 2022-04-15 ENCOUNTER — Other Ambulatory Visit (INDEPENDENT_AMBULATORY_CARE_PROVIDER_SITE_OTHER): Payer: Self-pay | Admitting: Internal Medicine

## 2022-04-15 ENCOUNTER — Ambulatory Visit: Admission: RE | Admit: 2022-04-15 | Payer: BLUE CROSS/BLUE SHIELD | Source: Ambulatory Visit

## 2022-04-15 DIAGNOSIS — R7989 Other specified abnormal findings of blood chemistry: Secondary | ICD-10-CM

## 2022-04-18 ENCOUNTER — Telehealth (HOSPITAL_BASED_OUTPATIENT_CLINIC_OR_DEPARTMENT_OTHER): Payer: Self-pay | Admitting: Surgery

## 2022-04-18 NOTE — Telephone Encounter (Signed)
BENEFIT DETERMINATION ENCOUNTER WITH INSURANCE    Insurance Disclaimer: "A quote of benefits and/or authorization does not guarantee payment or verify eligibility. Payment of benefits are subject to all terms, conditions, limitations, and exclusions of the member's contract at time of service."    REFERENCE NUMBER:  REPRESENTATIVE:     PRIMARY:  FEP BCBS    SURGEON: NAIN OFFICE:  FO DATE OF CONSULT:    05/02/2022     MEMBER INFORMATION:  Victoria Woodward    CARRIER  FEP [employer]   INSURANCE ID [id number]   SUBSCRIBER NAME [name], [name]   DOB May 31, 1981   STATUS ACTIVE   RENEWS      EMPLOYER         IN NETWORK STATUS OFFICE?   Y     IN NETWORK STATUS PROVIDER? Y     LIMIT  ACCUMILATION  MET/NOT MET    INDIVIDUAL DEDUCTIBLE       INDIVIDUAL OUT OF POCKET       FAMILY DEDUCTIBLE       FAMILY OUT OF POCKET         ______________________________________________________________________    BENEFITS YES/NO    MORBID OBESITY            Specialist Copay: Deductibles:  Co-Insurance:           ____________________________________________________________________  BARIATRIC SURGERY COVERAGE DETAILS FOR INPATIENT  [phone]  DX: E66.01   CONTRACT  GUIDELINES/EXCLUSIONS:     OTHER INDICATED CRITERIA/GUIDELINES:     PRIOR AUTHORIZATION REQUIRED:     PRIOR AUTH CONTACT INFORMATION:     NUTRITION PROGRAM REQUIREMENT:   3 MONTHS    6 MONTHS NICOTINE FREE REQUIREMENT FOR BCBS [employer], GEHA, FED BCBS    BRS ENROLLEMENT REQUIRED: Tel:[phone] (30-Month Program Required): n/a    AHP ENROLLEMENT REQUIRED:Tel: [phone] (70-Month Program Required): n/a    INPATIENT COPAY:    CO-INSURANCE:     DEDUCTIBLE:     _______________________________________________________________________  NUTRITION COUNSELING BENEFITS    Nutrition Counseling Benefits? YES Nutrition Copayment: Co-Insurance:       Visits: BASED ON MEDICAL NECESSITY  Used:       Deductible:        [id number]  (MEDICAL NUTRITION INITIAL INDIVIDUAL) YES E66.01(MORBID OBESITY)     [phone]  (MEDICAL NUTRITION, SUBSEQUENT, INDIVIDUAL) YES E66.9(BMI CLASS II W/CO-MORBIDITY)    [id number]  (MEDICAL NUTRITION GROUP) YES Z71.3(DIETARY COUNSELING)    [phone]  (MEDICAL NUTRITION INITIAL, INDIVIDUAL, RE-ASSESSMENT)  Z98.84(S/P) BARIATRIC SURGERY    [id number]  (MEDICAL NUTRITION GROUP, RE-ASSESSMENT)  Prior auth required    [id number]  (GASTRID BAND ADJUSTMENT: DX: Z46.51)  TEL:                         FAX:    ________________________________________________________________________  TELEHEALTH BENEFITS INCLUDING VIRTUAL/PHONE ENCOUNTERS       Benefits? YES  BASED ON MEDICAL NECESSITY  Co-Insurance:      Deductible:

## 2022-04-26 ENCOUNTER — Encounter (INDEPENDENT_AMBULATORY_CARE_PROVIDER_SITE_OTHER): Payer: BLUE CROSS/BLUE SHIELD | Admitting: Internal Medicine

## 2022-05-02 ENCOUNTER — Ambulatory Visit (HOSPITAL_BASED_OUTPATIENT_CLINIC_OR_DEPARTMENT_OTHER): Payer: BLUE CROSS/BLUE SHIELD | Admitting: Surgery

## 2022-05-02 ENCOUNTER — Encounter (HOSPITAL_BASED_OUTPATIENT_CLINIC_OR_DEPARTMENT_OTHER): Payer: Self-pay | Admitting: Surgery

## 2022-05-02 ENCOUNTER — Encounter (INDEPENDENT_AMBULATORY_CARE_PROVIDER_SITE_OTHER): Payer: Self-pay | Admitting: Internal Medicine

## 2022-05-02 ENCOUNTER — Encounter (HOSPITAL_BASED_OUTPATIENT_CLINIC_OR_DEPARTMENT_OTHER): Payer: Self-pay

## 2022-05-02 ENCOUNTER — Other Ambulatory Visit (FREE_STANDING_LABORATORY_FACILITY): Payer: BLUE CROSS/BLUE SHIELD

## 2022-05-02 VITALS — BP 136/92 | HR 99 | Temp 97.1°F | Ht 63.0 in | Wt 242.0 lb

## 2022-05-02 DIAGNOSIS — R7989 Other specified abnormal findings of blood chemistry: Secondary | ICD-10-CM

## 2022-05-02 DIAGNOSIS — F419 Anxiety disorder, unspecified: Secondary | ICD-10-CM

## 2022-05-02 DIAGNOSIS — I10 Essential (primary) hypertension: Secondary | ICD-10-CM

## 2022-05-02 DIAGNOSIS — F329 Major depressive disorder, single episode, unspecified: Secondary | ICD-10-CM

## 2022-05-02 DIAGNOSIS — Z6841 Body Mass Index (BMI) 40.0 and over, adult: Secondary | ICD-10-CM

## 2022-05-02 LAB — HEPATIC FUNCTION PANEL
ALT: 28 U/L (ref 0–55)
AST (SGOT): 17 U/L (ref 5–41)
Albumin/Globulin Ratio: 1.2 (ref 0.9–2.2)
Albumin: 3.7 g/dL (ref 3.5–5.0)
Alkaline Phosphatase: 98 U/L (ref 37–117)
Bilirubin Direct: 0.1 mg/dL (ref 0.0–0.5)
Bilirubin Indirect: 0.1 mg/dL — ABNORMAL LOW (ref 0.2–1.0)
Bilirubin, Total: 0.2 mg/dL (ref 0.2–1.2)
Globulin: 3.2 g/dL (ref 2.0–3.6)
Protein, Total: 6.9 g/dL (ref 6.0–8.3)

## 2022-05-02 LAB — HEPATITIS PANEL, ACUTE
Hep A IgM: NONREACTIVE
Hepatitis B Core IgM: NONREACTIVE
Hepatitis B Surface Antigen: NONREACTIVE
Hepatitis C, AB: NONREACTIVE

## 2022-05-02 LAB — IGG: Immunoglobulin G: 1446 mg/dL (ref 540–1822)

## 2022-05-02 LAB — CERULOPLASMIN: Ceruloplasmin: 24 mg/dL (ref 20–60)

## 2022-05-02 LAB — FERRITIN: Ferritin: 160.9 ng/mL (ref 4.60–204.00)

## 2022-05-02 LAB — GGT: GGT: 118 U/L — ABNORMAL HIGH (ref 10–70)

## 2022-05-02 LAB — HEMOLYSIS INDEX: Hemolysis Index: 2 Index (ref 0–24)

## 2022-05-02 NOTE — Progress Notes (Signed)
Drs. Fermin Schwab, [place], and [place]   [address]   [place], [place] [address]   [phone]   [phone]     [address]   [place], [place] [address]   [phone]   [phone]    Dear Dr. Torrie Mayers,    I had a pleasure of seeing Ms. Victoria Woodward in the office in consultation for possible bariatric surgery to resolve and treat morbid obesity and comorbid conditions. Patient has tried and failed multiple previous attempts at conservative weight loss programs. Bariatric comorbidities present: BMI:  43 , hypercholesterolemia, ?fatty liver disease  We had a long discussion and thoroughly reviewed past medical and surgical history. She has attended an Retail banker and was given a booklet summarizing weight loss surgery options, risks and results. The surgical options discussed include robotic/laparoscopic Roux-en Y gastric bypass and robotic/laparoscopic sleeve gastrectomy. All questions and concerns were addressed in detail.    I discussed the patient's individual relevant bariatric issues:  1)Hypercholesterolemia--recent cholesterol elevated  2)Fatty liver disease?  Slightly elevated  3)?sleep apnea--I recommend evaluation    Bariatric comorbidities present: BMI: 43 , hypercholesterolemia, ?fatty liver disease    CURRENT PROBLEM LIST:   Patient Active Problem List   Diagnosis    Fall    Class 2 obesity due to excess calories without serious comorbidity with body mass index (BMI) of 38.0 to 38.9 in adult    Adjustment reaction with anxiety and depression    Closed compression fracture of body of L1 vertebra    Intractable back pain    Headache    Anxiety    Major depressive disorder    Morbid obesity with BMI of 40.0-44.9, adult     PAST MEDICAL HISTORY:   Past Medical History:   Diagnosis Date    ADHD     Ankle fracture     left    Anxiety     Anxiety     Boxer's fracture     right    Depression     Endometriosis 2006    s/p partial hysterectomy     Hx of back injury     Hypercholesterolemia     Insomnia     Morbid obesity with BMI of 40.0-44.9, adult      PAST SURGICAL HISTORY:   Past Surgical History:   Procedure Laterality Date    CESAREAN SECTION  2003    ENDOMETRIOSIS SURGERY      had endometrioma removed from abdominal wall with mesh placement    HYSTERECTOMY  2006    partial hysterectomy    LIFT, ARM (COSMETIC) Bilateral     Breast lift lipo in arms and abdomen    VERTEBROPLASTY/KYPHOPLASTY N/A 05/08/2020    Procedure: VERTEBROPLASTY/KYPHOPLASTY;  Surgeon: Shon Millet, MD;  Location: [facility];  Service: Interventional Radiology;  Laterality: N/A;     FAMILY HISTORY:    Family History   Problem Relation Age of Onset    Dermatomyositis Mother 14    Heart failure Mother     Diabetes Father     Heart disease Father         CHF    Hyperlipidemia Father     Dementia Father     Diabetes Sister     Heart disease Brother         VALVE disease    Colon cancer Maternal Grandmother         79    Cancer Paternal  Aunt         unkown     SOCIAL HISTORY:   Social History     Socioeconomic History    Marital status: Widowed     Spouse name: None    Number of children: 3    Years of education: None    Highest education level: None   Occupational History    Occupation: Firefighter     Comment: Full-time   Tobacco Use    Smoking status: Former     Types: Cigars     Quit date: 2022     Years since quitting: 1.4    Smokeless tobacco: Never    Tobacco comments:     1 "black" per day   Vaping Use    Vaping status: Every Day   Substance and Sexual Activity    Alcohol use: Not Currently     Comment: occasional, stopped drinking heavy 1-23yrs ago (daily shots 7)    Drug use: Yes     Types: Marijuana     Comment: daily--smoke    Sexual activity: Yes     Partners: Male     Comment: one partner   Other Topics Concern    Dietary supplements / vitamins Not Asked    Anesthesia problems No    Blood thinners Not Asked    Pregnant Not Asked    Future Children Not Asked    Number  of Pregnancies? Not Asked    Number of children Not Asked    Miscarriages / Abortions? Not Asked    Eats large amounts Not Asked    Excessive Sweets Not Asked    Skips meals Not Asked    Eats excessive starches Not Asked    Snacks or grazes Not Asked    Emotional eater Not Asked    Eats fried food Not Asked    Eats fast food Not Asked    Diet Center Not Asked    Doylene Bode Not Asked    [facility] Not Asked    Nutri-System Not Asked    Opti-Fast / Medi-Fast Not Asked    Overeaters Anonymous Not Asked    [facility] Not Asked    TOPS Not Asked    Weight Watchers Not Asked    Atkins Not Asked    Binging / Purging Not Asked    Calorie Counting Not Asked    Fasting Not Asked    High Protein Not Asked    Low Carb Not Asked    Low Fat Not Asked    [facility] Diet Not Asked    Slim Fast Not Asked    [facility] Not Asked    Stationary cycle or treadmill Not Asked    Gym/fitness Classes Not Asked    Home exercise/video Not Asked    Swimming Not Asked    Weight training Not Asked    Walking or running Not Asked    Hospitalization Not Asked    Hypnosis Not Asked    Physical therapy Not Asked    Psychological therapy Not Asked    Residential program Not Asked    Acutrim Not Asked    Byetta Not Asked    Contrave Not Asked    Dexatrim Not Asked    Diethylpropion Not Asked    Fastin Not Asked    Fen - Phen Not Asked    Ionamin / Adipex Not Asked    Phentermine Not Asked  Qsymia Not Asked    Prozac Not Asked    Saxenda Not Asked    Topamax Not Asked    Wellbutrin Not Asked    Xenical (Orlistat, Alli) Not Asked    Other Med Not Asked    No impairment Not Asked    Walks with cane/crutch Not Asked    Requires a wheelchair Not Asked    Bedridden Not Asked    Are you currently being treated for depression? Not Asked    Do you snore? Not Asked    Are you receiving any medical or psychological services? Not Asked    Do you ever wake up at night gasping for breath? Not Asked    Do you have or have you been treated  for an eating disorder? Not Asked    Anyone ever told you that you stop breathing while asleep? Not Asked    Do you exercise regularly? Not Asked    Have you or family member ever have trouble with anesthesia? Not Asked   Social History Narrative    Nutrition:  Non-specific diet    Caffeine Intake:  None    Exercise:  None     Social Determinants of Health     Financial Resource Strain: Low Risk  (03/28/2022)    Overall Financial Resource Strain (CARDIA)     Difficulty of Paying Living Expenses: Not hard at all   Food Insecurity: No Food Insecurity (03/28/2022)    Hunger Vital Sign     Worried About Running Out of Food in the Last Year: Never true     Ran Out of Food in the Last Year: Never true   Transportation Needs: No Transportation Needs (03/28/2022)    PRAPARE - Therapist, artTransportation     Lack of Transportation (Medical): No     Lack of Transportation (Non-Medical): No   Physical Activity: Unknown (03/28/2022)    Exercise Vital Sign     Days of Exercise per Week: 0 days     Minutes of Exercise per Session: Not on file   Stress: Stress Concern Present (03/28/2022)    [employer] - Occupational Stress Questionnaire     Feeling of Stress : Rather much   Social Connections: Moderately Isolated (03/28/2022)    Social Connection and Isolation Panel [NHANES]     Frequency of Communication with Friends and Family: More than three times a week     Frequency of Social Gatherings with Friends and Family: Never     Attends Religious Services: More than 4 times per year     Active Member of [employer] Financial[employer]ubs or Organizations: No     Attends BankerClub or Organization Meetings: Never     Marital Status: Widowed   Intimate Partner Violence: Not At Risk (03/28/2022)    Humiliation, Afraid, Rape, and Kick questionnaire     Fear of Current or Ex-Partner: No     Emotionally Abused: No     Physically Abused: No     Sexually Abused: No   Housing Stability: Low Risk  (03/28/2022)    Housing Stability Vital Sign     Unable to Pay for  Housing in the Last Year: No     Number of Places Lived in the Last Year: 1     Unstable Housing in the Last Year: No      Tobacco Dependence:  Social History     Tobacco Use   Smoking Status Smoker   Smokeless Tobacco Not Used  Counseling given: yes      Smoking Cessation Counseling:  Date: 05/02/2022  Advice/Assessment: Patient strongly urged to quit tobacco use.   RELEVANCE - patient counseled on how quitting may be personally relevant to the following topics:  leading a longer and better quality of life, decreased risk of cardiopulmonary disease, money saved by quitting smoking  and improving the health of people surrounding the pt  Total counseling time:  3-10 minutes    TOBACCO HISTORY:   Social History     Tobacco Use   Smoking Status Former    Types: Cigars    Quit date: 2022    Years since quitting: 1.4   Smokeless Tobacco Never   Tobacco Comments    1 "black" per day   Vaping Use   Vaping Status Every Day     ALCOHOL HISTORY:   Social History     Substance and Sexual Activity   Alcohol Use Not Currently    Comment: occasional, stopped drinking heavy 1-77yrs ago (daily shots 7)     DRUG HISTORY:   Social History     Substance and Sexual Activity   Drug Use Yes    Types: Marijuana    Comment: daily--smoke     CURRENT OUTPATIENT MEDICATIONS:   Outpatient Medications Marked as Taking for the 05/02/22 encounter (Office Visit) with Tommye Standard, MD   Medication Sig Dispense Refill    ALPRAZolam (XANAX) 1 MG tablet Take 1 tablet (1 mg) by mouth 2 (two) times daily      Amphetamine-Dextroamphetamine (ADDERALL PO) Take 30 mg by mouth 2 (two) times daily      DULoxetine (Cymbalta) 20 MG capsule Take 1 capsule (20 mg) by mouth daily      gabapentin (NEURONTIN) 300 MG capsule Take 1 capsule (300 mg total) by mouth every 8 (eight) hours (Patient taking differently: Take 1 capsule (300 mg) by mouth nightly as needed (insomnia) 3 tablets at night) 21 capsule 0    hydrOXYzine (ATARAX) 50 MG tablet Take 1 tablet (50 mg)  by mouth nightly      ziprasidone (GEODON) 20 MG capsule Take 1 capsule (20 mg) by mouth nightly       ALLERGIES:   Allergies   Allergen Reactions    Morphine Itching        Review of Systems  Constitutional: negative for fevers, night sweats  Respiratory: negative for SOB, cough but loud snoring at night  Cardiovascular: negative for chest pain, palpitations  Gastrointestinal: negative for dysphagia  Musculoskeletal:negative for bone pain, myalgias and stiff joints  Hematologic:  Negative for PE/DVT  Psychiatric:  hxt of major depression, managed well with her mental health team, denies hxt of hospitalizations  Objective:  Vital signs in last 24 hours:    BP (!) 136/92 (BP Site: Left arm, Patient Position: Sitting, Cuff Size: Large)   Pulse 99   Temp 97.1 F (36.2 C) (Temporal)   Ht 5\' 3"    Wt 242 lb   BMI 42.87 kg/m   W/ female chaperone present during entire examination:  General Appearance:    Alert, cooperative, no distress, obese   Head:    Normocephalic, without obvious abnormality, atraumatic   Eyes:     EOM's intact, no scleral icterus,  Assessment:  Morbid obesity  Depression  Hypercholesterolemia  ?OSA  ?Fatty liver disease      Plan:  In brief Ms. Menge is a 41 y.o. year old morbidly obese patient with comorbid conditions who has expressed an interest in the Robotic/Laparoscopic SG-Sleeve.  The risks/benefits and alternatives were discussed with the patient at length:  bleeding, infection, leakage, ulcer, stricture, hollow viscus injury, solid organ injury, DVT/PE, MI, CVA, death, no meaningful weight loss, need for lifelong vitamin and nutritional supplementation, etc.   Patient was given a preoperative testing check list which includes dietary and psychiatric counseling for behavioral modification and medical/cardiac clearance (if indicated). Patient will be seen in the office on multiple occasions preoperatively.   In the best interest  of this patients overall success, we would appreciate your assistance with the necessary preoperative clearances.  I recommend the patient undergo EGD to assess the foregut prior to surgery, assess for inflammation, ulcers, tumors, and/or hiatal hernia prior to surgery.  In addition, they should also proceed with continued workup for elevated cholesterol levels, ?fatty liver disease, and sleep apnea.  She will f/u with her PCP for continued management.  We discussed the immediate cessation of alcohol and smoking/vaping.  She understands and agrees.    Thank you for allowing me to participate in there care.  We will work with the patient and your office to proceed with the necessary pre-surgical checklist and documentation as we obtain the appropriate approvals for surgery.  We will keep you and your office well informed.    Tommye Standard, MD FACS

## 2022-05-03 LAB — ANA IFA W/REFLEX TO TITER/PATTERN/AB: ANA Screen: NEGATIVE

## 2022-05-04 ENCOUNTER — Other Ambulatory Visit (INDEPENDENT_AMBULATORY_CARE_PROVIDER_SITE_OTHER): Payer: Self-pay | Admitting: Internal Medicine

## 2022-05-04 DIAGNOSIS — R7989 Other specified abnormal findings of blood chemistry: Secondary | ICD-10-CM

## 2022-05-05 ENCOUNTER — Telehealth (INDEPENDENT_AMBULATORY_CARE_PROVIDER_SITE_OTHER): Payer: Self-pay

## 2022-05-05 ENCOUNTER — Telehealth (HOSPITAL_BASED_OUTPATIENT_CLINIC_OR_DEPARTMENT_OTHER): Payer: BLUE CROSS/BLUE SHIELD | Admitting: Registered"

## 2022-05-05 ENCOUNTER — Other Ambulatory Visit: Payer: BLUE CROSS/BLUE SHIELD

## 2022-05-05 ENCOUNTER — Other Ambulatory Visit (INDEPENDENT_AMBULATORY_CARE_PROVIDER_SITE_OTHER): Payer: Self-pay | Admitting: Internal Medicine

## 2022-05-05 DIAGNOSIS — R7401 Elevation of levels of liver transaminase levels: Secondary | ICD-10-CM

## 2022-05-05 NOTE — Progress Notes (Signed)
Pre Op #1    Went over strength training and aerobic training with patient.  Prescribed exercise recommendations prior to surgery.  Went over target heart rate zone and RPE scale to measure exercise. Talked about how many steps they should try and reach each day.   Went into detail and gave handouts on all information    Went over post op exercise guidelines and answered all questions.    30 minutes was spent educating patient     Exercise and Physical activity Goals:  1.Continue current exercise routine and add in strength training 1x/week.

## 2022-05-09 ENCOUNTER — Other Ambulatory Visit (HOSPITAL_BASED_OUTPATIENT_CLINIC_OR_DEPARTMENT_OTHER): Payer: Self-pay | Admitting: Surgery

## 2022-05-09 ENCOUNTER — Encounter (HOSPITAL_BASED_OUTPATIENT_CLINIC_OR_DEPARTMENT_OTHER): Payer: Self-pay

## 2022-05-09 DIAGNOSIS — K219 Gastro-esophageal reflux disease without esophagitis: Secondary | ICD-10-CM

## 2022-05-10 ENCOUNTER — Other Ambulatory Visit: Payer: Self-pay | Admitting: Family Medicine

## 2022-05-10 DIAGNOSIS — M62838 Other muscle spasm: Secondary | ICD-10-CM

## 2022-05-10 DIAGNOSIS — M47812 Spondylosis without myelopathy or radiculopathy, cervical region: Secondary | ICD-10-CM

## 2022-05-10 DIAGNOSIS — M546 Pain in thoracic spine: Secondary | ICD-10-CM

## 2022-05-10 DIAGNOSIS — M542 Cervicalgia: Secondary | ICD-10-CM

## 2022-05-10 DIAGNOSIS — M25519 Pain in unspecified shoulder: Secondary | ICD-10-CM

## 2022-05-16 ENCOUNTER — Other Ambulatory Visit: Payer: BLUE CROSS/BLUE SHIELD

## 2022-05-19 ENCOUNTER — Encounter (INDEPENDENT_AMBULATORY_CARE_PROVIDER_SITE_OTHER): Payer: Self-pay

## 2022-05-23 ENCOUNTER — Other Ambulatory Visit: Payer: BLUE CROSS/BLUE SHIELD

## 2022-05-25 ENCOUNTER — Encounter (INDEPENDENT_AMBULATORY_CARE_PROVIDER_SITE_OTHER): Payer: Self-pay | Admitting: Licensed Professional Counselor

## 2022-05-25 ENCOUNTER — Encounter (INDEPENDENT_AMBULATORY_CARE_PROVIDER_SITE_OTHER): Payer: Self-pay

## 2022-05-25 ENCOUNTER — Telehealth (INDEPENDENT_AMBULATORY_CARE_PROVIDER_SITE_OTHER): Payer: BLUE CROSS/BLUE SHIELD | Admitting: Licensed Professional Counselor

## 2022-05-25 DIAGNOSIS — Z7289 Other problems related to lifestyle: Secondary | ICD-10-CM

## 2022-05-25 DIAGNOSIS — F419 Anxiety disorder, unspecified: Secondary | ICD-10-CM

## 2022-05-25 DIAGNOSIS — Z01818 Encounter for other preprocedural examination: Secondary | ICD-10-CM

## 2022-05-25 DIAGNOSIS — F3341 Major depressive disorder, recurrent, in partial remission: Secondary | ICD-10-CM

## 2022-05-25 NOTE — Progress Notes (Addendum)
Maylene Roes, LPC  ________________________    09/05/2022    Gary Fleet  [address]  [place], [place] [address]  [phone] Phone  [phone] Fax     DOB: 05-25-1981    MRN#: [id number]  Name: Wendi Maya    On May 25, 2022, I met via telemedicine with Wendi Maya for a psychological evaluation for clearance for bariatric surgery.  A semi-structured diagnostic interview with Ms. Bradwell reviewed her background information including her weight/dietary history and current behavioral patterns of eating. Francetta Found Meggett's candidacy for bariatric surgery was assessed based on the criteria and guidelines suggested by American Society for Metabolic and Bariatric Surgery.    In our meeting, Ms. Schwier's mental status presentations and her social/emotional functioning were assessed, as well as current and potential future life stressors. Surgery candidacy determination included a review of  Ms. Bhalla's understanding of bariatric surgery and the psychological and behavioral requisites that support a successful outcome.  The meeting with Ms. Conkel included an emphasis on her lifelong personal responsibilities to comply with health monitoring requirements, nutritional guidelines and a program of regular physical activity.  She was advised of areas of focus and potential challenges specific to her psychosocial history and eating behavioral profile.              Although an initial evaluation cannot comprehensively determine any patient's long term prognosis for bariatric success, Ms. Luba did not present with any palpable psychological counter-indications for bariatric surgery and she is clinically cleared for surgery at this time.  For details of assessment, please see report below.    Ms. Mantei reported she is participating in [facility]'s pre-surgical nutritional program.  She reported examples of implementing behavioral change to support her goal of a healthier  life style.  The ramifications of personal change from both short term and long term perspectives were discussed.     Ms. Valenti has been advised of the immediate and long term psychological challenges of bariatric surgery in general as well as those specific to her profile. Wendi Maya has been advised of cognitive and behavioral symptoms that could compromise an optimal outcome to her course of treatment and was given bariatric resources for future reference.       Therapist IV  Integrated Behavioral Health  [facility]  ______________________________________________________________________    Outpatient Services Bariatric Psychological Evaluation    05/25/2022    Start:  3:00 PM End:  3:50  PM    Interpreter present? no    Verbal Informed Consent:  During COVID-19 pandemic, requirement for written consent has been waived. Provider will review the following documents verbally with patient prior to starting this evaluation.     Telemedicine:   Verbal consent has been obtained from the patient to conduct a video visit.   Yes    Informed Consent for Behavioral Health Therapy:  Review of informed consent for behavioral health therapy was completed verbally:   Yes    Reason for appointment: Sela Falk is a 41 y.o. African American widowed female presenting for a psychological evaluation for a clinical recommendation for bariatric related surgery.    Behavioral Observations:     Ms. Cathy was on time for the video visit.  Patient presented as alert, oriented in all spheres and engaged in the encounter.  Patient presents as psychologically stable and cognitively able to describe, understand and consent to bariatric surgery.     Assessments were done to review  patient's surgical readiness regarding patient's mental status and levels of functioning, psychiatric history, psychosocial components of eating behavior, past and present physical activity status, family of origin dynamics, body image,  trauma history, weight management history and life style adjustment plans.     Purpose of Assessment:  To determine within the limits of psychological certainly:  Whether the patient is prepared for the changes in diet and lifestyle which bariatric surgery will impose; whether the patient has a reliable support system and access to supportive resources;  Whether patient is committed to post operative compliance following bariatric surgery; and whether the patient has sufficient information on which to base informed consent for bariatric surgery.     Psychosocial History:     Patient is originally from: [place]  Children: 3., 40, 22, 81 y.o.  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 Private Vehicle - Patient to drive self.    Financial resource strain: denies  Food insecurity: denies    H/O Psychiatric Symptoms:  Anxiety:  racing thoughts, in remission with medication  Eating Disorders: Denies associated symptoms.  History of disordered eating requiring treatment: Denies  Concerns about disordered eating behavior WNU:UVOZDG  Depression: MDD, recurrent, dx this year, reports symptoms in partial remission and stable since therapy and psychiatric care  Mania: Denies associated symptoms.  OCD:  Denies associated symptoms  Psychosis:  Denies associated symptoms  Other: ADHD, dx this year    Family History   Problem Relation Age of Onset    Dermatomyositis Mother 4    Heart failure Mother     Diabetes Father     Heart disease Father         CHF    Hyperlipidemia Father     Dementia Father     Diabetes Sister     Heart disease Brother         VALVE disease    Colon cancer Maternal Grandmother         42     Cancer Paternal Aunt         unkown     Family History of Death by Suicide/ Homicide: None  Family History of Mental Illness: Patient denied   Family History of Substance Use: Patient denied   Family History of Obesity: Endorses    She reports a history of trauma: death of husband Apr 07, 2014, multitude of losses.       Mental Health Treatment History and Outcomes:   Previous Treatment/diagnosis:  Therapist, 1x a month, virtual, since COVID, Dr. Valente David, [facility]  Current Psychiatrist: Dr. De Blanch, monitored 1x a month, [facility]  Psychotropic medications:Vyvanse; Cymbalta; Alprazolam, Geodon, Gabapentin   Previous hospitalizations: None    Use of caffeine: coffee 2x /day  Tobacco use:  vaping, 3x a day: quit date 05/30/22  Substance Use:  Cannabis  uses currently. - smoking, understand need to quit, quit date 05/30/22  Alcohol:  denies currently    C.A.G.E  Patient feels she ought to cut down on drinking and/or drug use: no  Patient has been annoyed by others criticizing drinking or drug use: no  Patient has felt bad or guilty about her drinking or drug use:no  Patient has had a  drink or used drugs as an eye opener first thing in the morning to steady nerves, get rid of a hangover or get the day started: no    Patient acknowledges information about potential transfer of addictive behavior patterns following weight loss surgery.    Legal History: (past or pending charges, convictions, probation or parole status)    Legal consequences of chemical use: no   Legal status: The patient has no significant history of legal issues.    Weight Hx:   Patient is preparing for the following bariatric surgery: gastric sleeve  Patient accurately described the procedure and notes no current or past abnormal anxiety, fear, phobias in regards to surgical and medical procedures.      Obesity Related Psychosocial distress: Depressed mood, self esteem/self-concept deficits, body size  dissatisfaction, weight related impairments in realms of daily functioning, chronic medical conditions, weight related health risks     Obesity Narrative:  Patient reports weight problems since adulthood in response to trauma experience  Lowest adult weight: 198 lbs  Highest adult weight: 298 lbs  Present Weight: 242 lbs BMI 42.87  Patient has made the following behavioral changes to improve readiness for surgery: Patient has just starting bariatric program, eating more, reducing sugar, drinking water, moving more    Who shops for food? patient  Who prepares food? patient  Who eats with patient? patient    Patient has tried multiple diet and exercise programs without achieving long term success. She reports feeling that she has exhausted her options for independent weight loss and needs the assistance of surgery to improve her health, quality and quantity of life.   Per patient's insurance, patient is required to complete pre-surgery education classes to help to strengthen knowledge deficit and support behaviors of post-operative weight loss management.       Assessments and inventories:     Mental Status Exam:   General Appearance: neatly groomed, casually dressed, and overweight   Behavior/Psychomotor: normal  and good eye contact  Mood: euthymic  Affect: congruent with mood  Speech: normal pitch and normal volume  Language: verbal expression is clear , has comprehensible ideas through verbal articulation, and ideas are conveyed and properly produced  Thought Form/Process: well organized, associations intact, and goal directed  Thought Content: absent of psychotic symptoms and absent of suicidal or homicidal ideation  Perception /Sensorium: clear, intact and makes sense of stimuli received  Judgment: good  Insight: good  Cognitive Functioning: cognition grossly intact, alert, oriented to person, oriented to place, oriented to time, oriented to situation, memory intact, and attentive     Risk Assessment:   Risk  factors:None reported   Protective Factors: Identifies reasons for living Responsibility to family or others; living with family, Supportive social network of family or friends, and Engaged in work or school  Suicide Attempts/Gestures/Thoughts/Plan:-No suicidal thoughts, no intent, no plan  Homicidal Thoughts/Plan/Gestures:-No homicidal thoughts, no intent, no plan.  Recent Physical Aggression/Violence/Anger:Denies associated symptoms.  Access to Weapons: Yes, firearms in its case    Diagnosis & Problem:      No diagnosis found.  Patient Active Problem List    Diagnosis Date Noted    Gastroesophageal reflux disease without esophagitis 05/09/2022    Morbid obesity with BMI of 40.0-44.9, adult     Anxiety 03/28/2022    Major depressive disorder 03/28/2022    Intractable back pain 05/07/2020    Headache 05/07/2020    Closed compression fracture of body of L1 vertebra 05/06/2020    Fall  04/15/2020    Adjustment reaction with anxiety and depression 08/25/2014    Class 2 obesity due to excess calories without serious comorbidity with body mass index (BMI) of 38.0 to 38.9 in adult 07/08/2013       Clinical Summary:     Ms. Pletz presents with obesity related psychosocial distress and weight related impairments in activities of daily living.    Patient's pre-operative challenges to focus on are in the following areas:  Cooking and meal prep, quitting smoking: as of next week    Ms. Qadir presents with strengths and positive supports in the following areas:  Psychological clearance given by psychiatrist 08/25/22.  Clinical assessment data suggests that the patient is a suitable bariatric surgical candidate at this time.    The patient indicates that she has made an informed decision to have bariatric surgery and that she is knowledgeable of the risks, benefits, alternatives and the potential complications of the procedure.    The patient states reasonable post-procedural expectations.    Patient acknowledges information  about potential transfer of addictive behavior patterns following weight loss surgery.  She asserts that she has reliable resources that will support her in her weight loss journey.    Arrangements for post-operative care and assistance have been made.     Patient is psychologically cleared for surgery.    Referrals provided: Yes: Bariatric support group, Bariatric APP, [employer] Facebook group

## 2022-05-26 ENCOUNTER — Telehealth (INDEPENDENT_AMBULATORY_CARE_PROVIDER_SITE_OTHER): Payer: BLUE CROSS/BLUE SHIELD

## 2022-05-27 ENCOUNTER — Telehealth (INDEPENDENT_AMBULATORY_CARE_PROVIDER_SITE_OTHER): Payer: Self-pay

## 2022-05-27 DIAGNOSIS — Z6841 Body Mass Index (BMI) 40.0 and over, adult: Secondary | ICD-10-CM

## 2022-05-27 DIAGNOSIS — Z719 Counseling, unspecified: Secondary | ICD-10-CM

## 2022-05-30 ENCOUNTER — Other Ambulatory Visit: Payer: BLUE CROSS/BLUE SHIELD

## 2022-05-30 NOTE — Progress Notes (Signed)
Fear of Failure    S & O: Patient reports she has  lost 0 pounds since the previous class. Pt actively participated in group activity. Pt continues to be interested in [facility] to improve co-morbid conditions.    Previous Wt: 242 lb  Wt Readings from Last 10 Encounters:   05/02/22 242 lb   04/13/22 236 lb 12.8 oz   12/09/20 204 lb   07/06/20 225 lb   05/22/20 236 lb   05/19/20 243 lb   05/07/20 242 lb 1 oz   05/04/20 242 lb   04/27/20 246 lb 3.2 oz   04/16/20 246 lb 14.6 oz       Nutrition Assessment and Diagnosis: Pt with the condition of morbid obesity and co-morbidities with ongoing  nutrition knowledge deficit as evidenced by initial report,  active participation in class, There is no height or weight on file to calculate BMI.    Patient continues to demonstrate motivation to modify behaviors, implement lifestyle changes to support continued weight loss. Patient has mild nutrition knowledge deficit as evidenced per initial report and continued elevated BMI.     Discussion focused on:     1. Strategies to support self-efficacy  2. Behavior changes that support best outcomes after WLS  3. Attending regular follow ups with Bariatrics team  4. Where to seek support before and after [facility]    Materials Provided:  Copy of powerpoint slides and accompanying handouts    P.   Return in 1 month for additional class with completed homework.  2.  Pt will con't goal setting per pt report/active previous class participation  and will con't to make small but measureable improvement in meal pattern/ choices/ and exercise habits.      Educated pt for 30 minutes in a group setting.  Plan reviewed with surgeon.

## 2022-05-31 ENCOUNTER — Encounter (HOSPITAL_BASED_OUTPATIENT_CLINIC_OR_DEPARTMENT_OTHER): Payer: Self-pay

## 2022-05-31 ENCOUNTER — Encounter (INDEPENDENT_AMBULATORY_CARE_PROVIDER_SITE_OTHER): Payer: BLUE CROSS/BLUE SHIELD | Admitting: Internal Medicine

## 2022-05-31 ENCOUNTER — Encounter (INDEPENDENT_AMBULATORY_CARE_PROVIDER_SITE_OTHER): Payer: Self-pay | Admitting: Licensed Professional Counselor

## 2022-05-31 NOTE — Progress Notes (Signed)
ROI faxed to therapist, Dr. Bynum Bellows, fax# [phone]

## 2022-06-01 ENCOUNTER — Telehealth (INDEPENDENT_AMBULATORY_CARE_PROVIDER_SITE_OTHER): Payer: Self-pay

## 2022-06-01 NOTE — Telephone Encounter (Signed)
Patient is calling to advise she had quit smoking under the direction of bariatric surgeon. Patient stopped smoking on 05/30/22 and has since has been feeling "numb", headache, "crying fits",  "does not want to do anything", lacking motivation. Patient is unsure if the quitting smoking is contributing to depression and would like "something" to help her. Patient states therapist recommended Nicorette gum, but bariatric office stated PCP can order a nicotine patch instead of gum. Please advise.

## 2022-06-01 NOTE — Progress Notes (Signed)
Pre OP #2    Patient was taught about avoiding sitting for long periods of time.  Making lists and planning ahead of time to be successful with their exercise program. Getting rid of the mindset of "all or nothing" when it comes to exercise.    30 minutes was spent educating patient     Goals:    1.Continue current exercise routine and add in strength training 1x/week.

## 2022-06-01 NOTE — Telephone Encounter (Signed)
Pt states she quit smoking 3 days ago and is feeling sad, decreased appetite and crying, headaches.  Her therapist recommended Nicorette gum.  I recommended she call her PCP and encouraged her not to go back to smoking.

## 2022-06-02 NOTE — Telephone Encounter (Signed)
Patient is booked for 06/09/22 VV.

## 2022-06-07 ENCOUNTER — Other Ambulatory Visit: Payer: BLUE CROSS/BLUE SHIELD

## 2022-06-07 ENCOUNTER — Other Ambulatory Visit (FREE_STANDING_LABORATORY_FACILITY): Payer: BLUE CROSS/BLUE SHIELD

## 2022-06-07 ENCOUNTER — Ambulatory Visit (INDEPENDENT_AMBULATORY_CARE_PROVIDER_SITE_OTHER): Payer: BLUE CROSS/BLUE SHIELD | Admitting: Obstetrics & Gynecology

## 2022-06-07 DIAGNOSIS — R7989 Other specified abnormal findings of blood chemistry: Secondary | ICD-10-CM

## 2022-06-07 DIAGNOSIS — R7401 Elevation of levels of liver transaminase levels: Secondary | ICD-10-CM

## 2022-06-07 LAB — HEPATIC FUNCTION PANEL
ALT: 16 U/L (ref 0–55)
AST (SGOT): 15 U/L (ref 5–41)
Albumin/Globulin Ratio: 1.2 (ref 0.9–2.2)
Albumin: 4.1 g/dL (ref 3.5–5.0)
Alkaline Phosphatase: 88 U/L (ref 37–117)
Bilirubin Direct: 0.1 mg/dL (ref 0.0–0.5)
Bilirubin Indirect: 0.2 mg/dL (ref 0.2–1.0)
Bilirubin, Total: 0.3 mg/dL (ref 0.2–1.2)
Globulin: 3.3 g/dL (ref 2.0–3.6)
Protein, Total: 7.4 g/dL (ref 6.0–8.3)

## 2022-06-07 LAB — HEMOLYSIS INDEX: Hemolysis Index: 8 Index (ref 0–24)

## 2022-06-08 LAB — GGT: GGT: 67 U/L (ref 10–70)

## 2022-06-09 ENCOUNTER — Telehealth (INDEPENDENT_AMBULATORY_CARE_PROVIDER_SITE_OTHER): Payer: BLUE CROSS/BLUE SHIELD | Admitting: Internal Medicine

## 2022-06-13 ENCOUNTER — Ambulatory Visit: Payer: BLUE CROSS/BLUE SHIELD

## 2022-06-13 NOTE — PSS Phone Screening (Signed)
Chart signed off-no pre-ops/low risk procedure.
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
## 2022-06-14 ENCOUNTER — Encounter (INDEPENDENT_AMBULATORY_CARE_PROVIDER_SITE_OTHER): Payer: Self-pay

## 2022-06-21 ENCOUNTER — Telehealth (INDEPENDENT_AMBULATORY_CARE_PROVIDER_SITE_OTHER): Payer: Self-pay | Admitting: Registered"

## 2022-06-21 DIAGNOSIS — Z719 Counseling, unspecified: Secondary | ICD-10-CM

## 2022-06-21 DIAGNOSIS — Z6841 Body Mass Index (BMI) 40.0 and over, adult: Secondary | ICD-10-CM

## 2022-06-21 NOTE — Progress Notes (Signed)
This class was conducted via [employer]    Nutrition 101    S & O: Patient has gained one pounds since the previous class. Pt actively participated in group activity.  Pt continues to be interested in [facility] to improve co-morbid conditions.    Previous Wt:   Wt Readings from Last 10 Encounters:   06/21/22 243 lb   06/13/22 242 lb   05/02/22 242 lb   04/13/22 236 lb 12.8 oz   12/09/20 204 lb   07/06/20 225 lb   05/22/20 236 lb   05/19/20 243 lb   05/07/20 242 lb 1 oz   05/04/20 242 lb       Nutrition Assessment and Diagnosis: Pt with the condition of morbid obesity (Body mass index is 43.05 kg/m.) and co-morbidities with ongoing nutrition knowledge deficit as evidenced by initial report;  active participation; weight gain from previous visit.  Patient continues to demonstrate motivation to lose weight and change behaviors. Patient has mild nutrition knowledge deficit as evidenced per initial report and continued elevated BMI. Patient was educated during the group session on calories, nutrient density of various foods, and how to choose appropriate foods for balanced meal planning. Discussion focused on:     1. Behavior Modification for weight loss  1. Understanding nutrient dense foods and their benefits.  2. Designing an appropriate post-op bariatric plate based on the bariatric food pyramid.  3. Learning how to accurately measure portion sizes and how they affect weight loss and maintenance.    4. Calorie intake and how it affects weight loss.    Materials Provided:  Copy of powerpoint slides and accompanying handout    P.   Return in 1 month for additional class [employer]) with completed homework.    Homework included:  Patient to include 2 nutrient dense foods in their diet and begin shaping their meals based on the "Bariatric Food Pyramid."   Pt will con't goal setting per pt report/active previous class participation  and will con't to make small but measureable improvement in meal pattern/  choices/ and exercise habits.      Educated pt for 30 minutes in a group setting.  Plan reviewed with surgeon.

## 2022-06-30 ENCOUNTER — Encounter (HOSPITAL_BASED_OUTPATIENT_CLINIC_OR_DEPARTMENT_OTHER): Payer: Self-pay

## 2022-07-06 ENCOUNTER — Encounter
Admission: RE | Disposition: A | Discharge status: Home / Self Care | Payer: Self-pay | Source: Ambulatory Visit | Attending: Surgery

## 2022-07-06 ENCOUNTER — Ambulatory Visit: Payer: BLUE CROSS/BLUE SHIELD | Admitting: Anesthesiology

## 2022-07-06 ENCOUNTER — Ambulatory Visit
Admission: RE | Admit: 2022-07-06 | Discharge: 2022-07-06 | Disposition: A | Discharge status: Home / Self Care | Payer: BLUE CROSS/BLUE SHIELD | Source: Ambulatory Visit | Attending: Surgery | Admitting: Surgery

## 2022-07-06 ENCOUNTER — Ambulatory Visit: Payer: Self-pay

## 2022-07-06 ENCOUNTER — Encounter: Payer: Self-pay | Admitting: Surgery

## 2022-07-06 DIAGNOSIS — Z6841 Body Mass Index (BMI) 40.0 and over, adult: Secondary | ICD-10-CM | POA: Insufficient documentation

## 2022-07-06 DIAGNOSIS — K2950 Unspecified chronic gastritis without bleeding: Secondary | ICD-10-CM | POA: Insufficient documentation

## 2022-07-06 DIAGNOSIS — R12 Heartburn: Secondary | ICD-10-CM | POA: Insufficient documentation

## 2022-07-06 DIAGNOSIS — K219 Gastro-esophageal reflux disease without esophagitis: Secondary | ICD-10-CM | POA: Insufficient documentation

## 2022-07-06 HISTORY — PX: EGD, BIOPSY: SHX3796

## 2022-07-06 HISTORY — PX: ESOPHAGOGASTRODUODENOSCOPY (EGD), BIOPSY: SHX3796

## 2022-07-06 SURGERY — ESOPHAGOGASTRODUODENOSCOPY (EGD), BIOPSY
Anesthesia: Anesthesia General | Site: Abdomen

## 2022-07-06 MED ORDER — LIDOCAINE HCL 2 % IJ SOLN
INTRAMUSCULAR | Status: DC | PRN
Start: 2022-07-06 — End: 2022-07-06
  Administered 2022-07-06: 100 mg

## 2022-07-06 MED ORDER — PROPOFOL 10 MG/ML IV EMUL (WRAP)
INTRAVENOUS | Status: DC | PRN
Start: 2022-07-06 — End: 2022-07-06
  Administered 2022-07-06 (×2): 50 mg via INTRAVENOUS
  Administered 2022-07-06: 30 mg via INTRAVENOUS

## 2022-07-06 MED ORDER — LACTATED RINGERS IV SOLN
INTRAVENOUS | Status: DC
Start: 2022-07-06 — End: 2022-07-06

## 2022-07-06 MED ORDER — LIDOCAINE 4 % EX CREA
TOPICAL_CREAM | Freq: Once | CUTANEOUS | Status: DC | PRN
Start: 2022-07-06 — End: 2022-07-06

## 2022-07-06 MED ORDER — SODIUM CHLORIDE 0.9 % IV SOLN
INTRAVENOUS | Status: DC | PRN
Start: 2022-07-06 — End: 2022-07-06
  Administered 2022-07-06: 12 ug via INTRAVENOUS

## 2022-07-06 MED ORDER — GLYCOPYRROLATE 0.2 MG/ML IJ SOLN (WRAP)
INTRAMUSCULAR | Status: DC | PRN
Start: 2022-07-06 — End: 2022-07-06
  Administered 2022-07-06: .2 mg via INTRAVENOUS

## 2022-07-06 SURGICAL SUPPLY — 14 items
BLOCK BITE 60FR LF STRAP FLXB SH DISP (Endoscopic Supplies) ×1
BLOCK BITE OD60 FR ADULT STRAP FLEXIBLE (Endoscopic Supplies) ×1
BLOCK BITE OD60 FR ADULT STRAP FLEXIBLE SIDEHOLE (Endoscopic Supplies) ×1 IMPLANT
FORCEPS BIOPSY L240 CM LARGE CAPACITY (Instrument) ×1
FORCEPS BIOPSY L240 CM MICROMESH TEETH STREAMLINE CATHETER NEEDLE (Instrument) ×1 IMPLANT
FORCEPS BX SS LG CPC RJ 4 2.4MM 240CM (Instrument) ×1
KIT COMPLIANCE ENDOKIT OP4 CA 1.1+ 6FT (Procedure Accessories) ×1
KIT ENDOSCOPIC L6 FT CLEAN ADAPTER (Procedure Accessories) ×1
KIT ENDOSCOPIC L6 FT CLEAN ADAPTER COMPLIANCE ENDOKIT ORCAPOD 4 1.1 OZ (Procedure Accessories) ×1 IMPLANT
MASK OXYGEN MEDIUM CONCENTRATION ADULT POM 7FT 3010318LTEZ (Other) ×1 IMPLANT
MASK POM EZ-LITE BRONCHO (Other) ×1
SPONGE GAUZE L4 IN X W4 IN 4 PLY HIGH (Sponge) ×1
SPONGE GAUZE L4 IN X W4 IN 4 PLY NONWOVEN LINT FREE CURITY RAYON (Sponge) ×1 IMPLANT
SPONGE GZE RYN PLSTR CRTY 4X4IN LF NS 4 (Sponge) ×1

## 2022-07-06 NOTE — Anesthesia Postprocedure Evaluation (Signed)
Anesthesia Post Evaluation    Patient: Victoria Woodward    Procedure(s):  EGD, BIOPSY    Anesthesia type: general    Last Vitals:   Vitals Value Taken Time   BP 127/80 07/06/22 0920   Temp 36.5 C (97.7 F) 07/06/22 0920   Pulse 85 07/06/22 0920   Resp 16 07/06/22 0920   SpO2 98 % 07/06/22 0920                 Anesthesia Post Evaluation:     Patient Evaluated: floor  Patient Participation: complete - patient participated  Level of Consciousness: awake and alert    Pain Management: adequate    Airway Patency: patent    Anesthetic complications: No      PONV Status: none    Cardiovascular status: acceptable  Respiratory status: acceptable  Hydration status: acceptable        Signed by: Kym Groom, MD, 07/06/2022 12:07 PM

## 2022-07-06 NOTE — Anesthesia Preprocedure Evaluation (Signed)
Anesthesia Evaluation    AIRWAY    Mallampati: II    TM distance: >3 FB  Neck ROM: full  Mouth Opening:full   CARDIOVASCULAR    cardiovascular exam normal       DENTAL    no notable dental hx               PULMONARY    pulmonary exam normal     OTHER FINDINGS                                      Relevant Problems   NEURO/PSYCH   (+) Headache      GI   (+) Gastroesophageal reflux disease without esophagitis               Anesthesia Plan    ASA 1     general                                                    Signed by: Kym Groom, MD 07/06/22 6:46 AM

## 2022-07-06 NOTE — Transfer of Care (Signed)
Anesthesia Transfer of Care Note    Patient: Victoria Woodward    Procedures performed: Procedure(s):  EGD, BIOPSY    Anesthesia type: General TIVA    Patient location:Phase II PACU    Last vitals:   Vitals:    07/06/22 0703   BP: (!) 140/97   Pulse: 79   Resp: 16   Temp: 36.3 C (97.3 F)   SpO2: 97%       Post pain: Patient not complaining of pain, continue current therapy      Mental Status:awake    Respiratory Function: tolerating room air    Cardiovascular: stable    Nausea/Vomiting: patient not complaining of nausea or vomiting    Hydration Status: adequate    Post assessment: no apparent anesthetic complications    Signed by: Uvaldo Rising, CRNA  07/06/22 8:50 AM

## 2022-07-06 NOTE — Discharge Instr - AVS First Page (Addendum)
Reason for your Hospital Admission:        Instructions for after your discharge:           Endoscopy Discharge Instructions  General Instructions:  1. Following sedation, your judgement, perception, and coordination are considered impaired. Even though you may feel awake and alert, you are considered legally intoxicated. Therefore, until the next morning;  Do not Drive  Do not operate appliances or equipment that requires reaction time (e.g. stove, electrical tools, machinery)  Do not sign legal documents or be involved in important decisions.  Do not smoke if alone  Do not drink alcoholic beverages  Go directly home and rest for several hours before resuming your routine activities.  It is highly recommended to have a responsible adult stay with you for the next 24 hours    2. Tenderness, swelling or pain may occur at the IV site where you received sedation. If you experience this, apply warm soaks to the area. Notify your physician if this persists.    Instructions Specific To Procedures - Report To Physician Any Of The Following:    Upper Endoscopy/ Upper Endoscopy and Dilatation   1. Pain in abdomen   2. Nausea/vomitting   3. Fevers/Chills within 24 hours after procedure. Temp>101deg F   4. Severe and persistent abdominal pain and bloating     In Addition:   Mild throat soreness may follow this procedure. Warm salt water gargling or    lozenges of your choice will most likely relieve your discomfort or cold drinks and   popsicles.        If you experience chest pain please call 911    Additional Discharge Instructions  Your diet after the procedure: Light to Regular diet as tolerated, no alcohol.  Special Instructions:   Prescriptions given: Yes/No  Patient education literature given; Yes      If you have questions or problems contact your MD immediately. If you need immediate attention, call your MD, 911 and/or go to nearest emergency room.

## 2022-07-06 NOTE — H&P (Signed)
Drs. Fermin Schwab, Herrings, and Pourshojae   [address], [address]   [place], [place] [address]   [phone]   [phone]     [address]   [place], [place] [address]   [phone]   [phone]  ADMISSION HISTORY AND PHYSICAL EXAM    Date Time: 07/06/22 8:00 AM  Patient Name: Victoria Woodward  Attending Physician: Tommye Standard, MD    History of Present Illness:   Victoria Woodward is a 41 y.o. female who presents to the hospital for pre-operative endoscopy:  morbid obesity    Past Medical History:     Past Medical History:   Diagnosis Date    ADHD     Ankle fracture     left    Anxiety     Boxer's fracture     right    Claustrophobia     COVID-19 vaccine administered     Moderna x's 2    Depression     Eczema     Endometriosis 2006    s/p partial hysterectomy    Hx of back injury     Hypercholesterolemia     Insomnia     Morbid obesity with BMI of 40.0-44.9, adult        Past Surgical History:     Past Surgical History:   Procedure Laterality Date    CESAREAN SECTION  2003    ENDOMETRIOSIS SURGERY  2006    had endometrioma removed from abdominal wall with mesh placement    HYSTERECTOMY  2006    partial hysterectomy    LIFT, ARM (COSMETIC) Bilateral 2017    Breast lift lipo in arms and abdomen    VERTEBROPLASTY/KYPHOPLASTY N/A 05/08/2020    Procedure: VERTEBROPLASTY/KYPHOPLASTY;  Surgeon: Shon Millet, MD;  Location: [facility];  Service: Interventional Radiology;  Laterality: N/A;       Family History:     Family History   Problem Relation Age of Onset    Dermatomyositis Mother 31    Heart failure Mother     Diabetes Father     Heart disease Father         CHF    Hyperlipidemia Father     Dementia Father     Diabetes Sister     Heart disease Brother         VALVE disease    Colon cancer Maternal Grandmother         19    Cancer Paternal Aunt         unkown       Social History:     Social History     Socioeconomic History    Marital status: Widowed     Spouse  name: Not on file    Number of children: 3    Years of education: Not on file    Highest education level: Not on file   Occupational History    Occupation: Firefighter     Comment: Full-time   Tobacco Use    Smoking status: Former     Types: Cigars     Quit date: 2022     Years since quitting: 1.6    Smokeless tobacco: Never    Tobacco comments:     1 "black" per day   Vaping Use    Vaping Use: Former    Quit date: 05/30/2022   Substance and Sexual Activity    Alcohol use: Not Currently  Comment: occasional, stopped drinking heavy 1-23yrs ago (daily shots 7)    Drug use: Not Currently     Types: Marijuana     Comment: quit 05/30/22    Sexual activity: Yes     Partners: Male     Birth control/protection: Surgical     Comment: one partner   Other Topics Concern    Dietary supplements / vitamins Not Asked    Anesthesia problems No    Blood thinners Not Asked    Pregnant Not Asked    Future Children Not Asked    Number of Pregnancies? Not Asked    Number of children Not Asked    Miscarriages / Abortions? Not Asked    Eats large amounts Not Asked    Excessive Sweets Not Asked    Skips meals Not Asked    Eats excessive starches Not Asked    Snacks or grazes Not Asked    Emotional eater Not Asked    Eats fried food Not Asked    Eats fast food Not Asked    Diet Center Not Asked    Doylene Bode Not Asked    [facility] Weight Loss Not Asked    Nutri-System Not Asked    Opti-Fast / Medi-Fast Not Asked    Overeaters Anonymous Not Asked    [facility] Not Asked    TOPS Not Asked    Weight Watchers Not Asked    Atkins Not Asked    Binging / Purging Not Asked    Calorie Counting Not Asked    Fasting Not Asked    High Protein Not Asked    Low Carb Not Asked    Low Fat Not Asked    [facility] Diet Not Asked    Slim Fast Not Asked    [facility] Not Asked    Stationary cycle or treadmill Not Asked    Gym/fitness Classes Not Asked    Home exercise/video Not Asked    Swimming Not Asked    Weight training Not Asked    Walking  or running Not Asked    Hospitalization Not Asked    Hypnosis Not Asked    Physical therapy Not Asked    Psychological therapy Not Asked    Residential program Not Asked    Acutrim Not Asked    Byetta Not Asked    Contrave Not Asked    Dexatrim Not Asked    Diethylpropion Not Asked    Fastin Not Asked    Fen - Phen Not Asked    Ionamin / Adipex Not Asked    Phentermine Not Asked    Qsymia Not Asked    Prozac Not Asked    Saxenda Not Asked    Topamax Not Asked    Wellbutrin Not Asked    Xenical (Orlistat, Alli) Not Asked    Other Med Not Asked    No impairment Not Asked    Walks with cane/crutch Not Asked    Requires a wheelchair Not Asked    Bedridden Not Asked    Are you currently being treated for depression? Not Asked    Do you snore? Not Asked    Are you receiving any medical or psychological services? Not Asked    Do you ever wake up at night gasping for breath? Not Asked    Do you have or have you been treated for an eating disorder? Not Asked    Anyone ever told you that you  stop breathing while asleep? Not Asked    Do you exercise regularly? Not Asked    Have you or family member ever have trouble with anesthesia? Not Asked   Social History Narrative    Nutrition:  Non-specific diet    Caffeine Intake:  None    Exercise:  None     Social Determinants of Health     Financial Resource Strain: Low Risk  (03/28/2022)    Overall Financial Resource Strain (CARDIA)     Difficulty of Paying Living Expenses: Not hard at all   Food Insecurity: No Food Insecurity (03/28/2022)    Hunger Vital Sign     Worried About Running Out of Food in the Last Year: Never true     Ran Out of Food in the Last Year: Never true   Transportation Needs: No Transportation Needs (03/28/2022)    PRAPARE - Therapist, art (Medical): No     Lack of Transportation (Non-Medical): No   Physical Activity: Unknown (03/28/2022)    Exercise Vital Sign     Days of Exercise per Week: 0 days     Minutes of Exercise per Session: Not  on file   Stress: Stress Concern Present (03/28/2022)    [employer] of Occupational Health - Occupational Stress Questionnaire     Feeling of Stress : Rather much   Social Connections: Moderately Isolated (03/28/2022)    Social Connection and Isolation Panel [NHANES]     Frequency of Communication with Friends and Family: More than three times a week     Frequency of Social Gatherings with Friends and Family: Never     Attends Religious Services: More than 4 times per year     Active Member of [employer] or Organizations: No     Attends Banker Meetings: Never     Marital Status: Widowed   Intimate Partner Violence: Not At Risk (03/28/2022)    Humiliation, Afraid, Rape, and Kick questionnaire     Fear of Current or Ex-Partner: No     Emotionally Abused: No     Physically Abused: No     Sexually Abused: No   Housing Stability: Low Risk  (03/28/2022)    Housing Stability Vital Sign     Unable to Pay for Housing in the Last Year: No     Number of Places Lived in the Last Year: 1     Unstable Housing in the Last Year: No       Allergies:     Allergies   Allergen Reactions    Morphine Itching       Medications:     Medications Prior to Admission   Medication Sig    ALPRAZolam (XANAX) 1 MG tablet Take 1 tablet (1 mg) by mouth 3 (three) times daily as needed    DULoxetine (CYMBALTA) 30 MG capsule Take 1 capsule (30 mg) by mouth daily    gabapentin (NEURONTIN) 300 MG capsule Take 1 capsule (300 mg total) by mouth every 8 (eight) hours (Patient taking differently: Take 1 capsule (300 mg) by mouth nightly as needed (insomnia) 3 tablets at night)    hydrOXYzine (ATARAX) 50 MG tablet Take 1 tablet (50 mg) by mouth nightly    ziprasidone (GEODON) 20 MG capsule Take 1 capsule (20 mg) by mouth nightly    Vyvanse 50 MG capsule Take 1 capsule (50 mg) by mouth every morning       Review of Systems:   History  obtained from chart review and the patient  Constitutional: negative for fevers, night sweats  Respiratory: negative  for SOB, cough  Cardiovascular: negative for chest pain, palpitations  Gastrointestinal: negative for dysphagia      Physical Exam:     Vitals:    07/06/22 0703   BP: (!) 140/97   Pulse: 79   Resp: 16   Temp: 97.3 F (36.3 C)   SpO2: 97%     General Appearance:    Alert, cooperative, no distress, obese   Head:    Normocephalic, without obvious abnormality, atraumatic   Eyes:     EOM's intact, no scleral icterus,                   Lungs:     Clear to auscultation bilaterally, respirations unlabored       Heart:    Regular rate and rhythm   Abdomen:     Soft, non-tender, bowel sounds active all four quadrants,     no masses, no organomegaly                   Neurologic:   Normal strength       throughout         Labs:     Results       ** No results found for the last 24 hours. **            Radiology Results (24 Hour)       ** No results found for the last 24 hours. **                       Assessment:   Morbid obesity    Plan:     EGD with possible biopsy/dilation--   The risks including:  bleeding, infection, perforation, flatulence, incomplete procedure requiring further evaluation were all discussed.  The patient agrees to an EGD with possible biopsy/dilation.          Signed by: Tommye Standard, MD, MD

## 2022-07-07 ENCOUNTER — Encounter: Payer: Self-pay | Admitting: Surgery

## 2022-07-08 ENCOUNTER — Encounter (HOSPITAL_BASED_OUTPATIENT_CLINIC_OR_DEPARTMENT_OTHER): Payer: Self-pay | Admitting: Surgery

## 2022-07-08 LAB — LAB USE ONLY - HISTORICAL SURGICAL PATHOLOGY

## 2022-07-13 ENCOUNTER — Ambulatory Visit: Payer: BLUE CROSS/BLUE SHIELD

## 2022-07-29 ENCOUNTER — Encounter (INDEPENDENT_AMBULATORY_CARE_PROVIDER_SITE_OTHER): Payer: Self-pay

## 2022-08-04 ENCOUNTER — Encounter (HOSPITAL_BASED_OUTPATIENT_CLINIC_OR_DEPARTMENT_OTHER): Payer: Self-pay

## 2022-08-04 ENCOUNTER — Telehealth (HOSPITAL_BASED_OUTPATIENT_CLINIC_OR_DEPARTMENT_OTHER): Payer: Self-pay | Admitting: Registered"

## 2022-08-05 ENCOUNTER — Ambulatory Visit: Payer: BLUE CROSS/BLUE SHIELD

## 2022-08-05 ENCOUNTER — Ambulatory Visit
Admission: RE | Admit: 2022-08-05 | Discharge: 2022-08-05 | Disposition: A | Discharge status: Home / Self Care | Payer: BLUE CROSS/BLUE SHIELD | Source: Ambulatory Visit | Attending: Family Medicine | Admitting: Family Medicine

## 2022-08-05 ENCOUNTER — Ambulatory Visit: Admission: RE | Admit: 2022-08-05 | Payer: BLUE CROSS/BLUE SHIELD | Source: Ambulatory Visit

## 2022-08-08 ENCOUNTER — Encounter (HOSPITAL_BASED_OUTPATIENT_CLINIC_OR_DEPARTMENT_OTHER): Payer: Self-pay

## 2022-08-09 ENCOUNTER — Ambulatory Visit (HOSPITAL_BASED_OUTPATIENT_CLINIC_OR_DEPARTMENT_OTHER): Payer: BLUE CROSS/BLUE SHIELD | Admitting: Family

## 2022-08-09 ENCOUNTER — Telehealth (INDEPENDENT_AMBULATORY_CARE_PROVIDER_SITE_OTHER): Payer: BLUE CROSS/BLUE SHIELD | Admitting: Registered"

## 2022-08-09 VITALS — Wt 245.0 lb

## 2022-08-09 DIAGNOSIS — Z719 Counseling, unspecified: Secondary | ICD-10-CM

## 2022-08-09 DIAGNOSIS — Z6841 Body Mass Index (BMI) 40.0 and over, adult: Secondary | ICD-10-CM

## 2022-08-11 NOTE — Progress Notes (Signed)
This class was conducted via [employer]    Nutrition 101    S & O: Patient has gained 3 pounds since the previous class. Pt actively participated in group activity.  Pt continues to be interested in [facility] to improve co-morbid conditions.    Previous Wt:   Wt Readings from Last 10 Encounters:   08/11/22 245 lb   07/06/22 242 lb   06/21/22 243 lb   06/13/22 242 lb   05/02/22 242 lb   04/13/22 236 lb 12.8 oz   12/09/20 204 lb   07/06/20 225 lb   05/22/20 236 lb   05/19/20 243 lb       Nutrition Assessment and Diagnosis: Pt with the condition of morbid obesity (Body mass index is 43.4 kg/m.) and co-morbidities with ongoing nutrition knowledge deficit as evidenced by initial report;  active participation; weight gain from previous visit.  Patient continues to demonstrate motivation to lose weight and change behaviors. Patient has mild nutrition knowledge deficit as evidenced per initial report and continued elevated BMI. Patient was educated during the group session on calories, nutrient density of various foods, and how to choose appropriate foods for balanced meal planning. Discussion focused on:     1. Behavior Modification for weight loss  1. Understanding nutrient dense foods and their benefits.  2. Designing an appropriate post-op bariatric plate based on the bariatric food pyramid.  3. Learning how to accurately measure portion sizes and how they affect weight loss and maintenance.    4. Calorie intake and how it affects weight loss.    Materials Provided:  Copy of powerpoint slides and accompanying handout    P.   Return in 1 month for additional class [employer]) with completed homework.    Homework included:  Patient to include 2 nutrient dense foods in their diet and begin shaping their meals based on the "Bariatric Food Pyramid."   Pt will con't goal setting per pt report/active previous class participation  and will con't to make small but measureable improvement in meal pattern/  choices/ and exercise habits.      Educated pt for 30 minutes in a group setting.  Plan reviewed with surgeon.

## 2022-08-16 ENCOUNTER — Ambulatory Visit (FREE_STANDING_LABORATORY_FACILITY): Payer: BLUE CROSS/BLUE SHIELD | Admitting: Obstetrics & Gynecology

## 2022-08-16 ENCOUNTER — Encounter (INDEPENDENT_AMBULATORY_CARE_PROVIDER_SITE_OTHER): Payer: Self-pay | Admitting: Obstetrics & Gynecology

## 2022-08-16 VITALS — BP 137/87 | HR 81 | Temp 98.7°F | Wt 241.0 lb

## 2022-08-16 DIAGNOSIS — Z0000 Encounter for general adult medical examination without abnormal findings: Secondary | ICD-10-CM

## 2022-08-16 DIAGNOSIS — Z01419 Encounter for gynecological examination (general) (routine) without abnormal findings: Secondary | ICD-10-CM

## 2022-08-16 NOTE — Patient Instructions (Addendum)
There are so many great resources available now to address sexual health. Here are some of my recommendations (and if you ever come across something that is great, let me know!):    1) MeetRosy: Royetta Asal is the first-of-its-kind platform offering women a holistic approach to sexual health and wellness. Download the [employer] app today to access free content, erotica, group discussions with a sex therapist, etc. (https://meetrosy.com/)    2) Coral: Relationship self-care. This app help couples & individuals deepen their connection with science backed methodology. (https://getcoral.app/)    3) There are many apps that offer erotica   Dipsea: Where sexual wellness meets storytelling. Sexy audio stories that set the mood and spark the imagination. (www.dipseastories.com)    4) Ohnut wearable: Painful sex meets a feel-good buffer: Ohnut is a revolutionary wearable that allows couples to explore comfortable penetration depths during sex. (https://ohnut.co)     5) [employer]: Mindful sex and wellbeing (kama.co/about)    6) www.omgyes.com: sex positive self-touch tutorial focused on female sexual pleasure     Books to read:   Come As You Are by Dr. Despina Arias: great for desire conflicts within relationships    Sex Therapists in the area:  Dr. Shawna Orleans Modjoros  [email or web address].[email or web address]/  To contact: info@sexhealthconsult .com    Address:   [address],   [place], [place] [address]      Tips for better pregnancy sleep:    We recommend 7-9 hours of sleep in a 24-hour period, and we also understand that poor sleep is a common challenge during pregnancy and the postpartum period. Fortunately, there are strategies that may help you sleep better.    GENERAL SLEEP TIPS  1) Only use the bed for sleeping  The goal is for your mind & body to equate the bed with sleeping and not with anxiously waiting for sleep to come.  No TV, working, or reading in the bed - only sleeping (and sex)!  If you're awake in bed  for longer than 20 minutes, get out of bed and do something relaxing in dim light. Get back in bed when you're sleepy.  Try to minimize sleep anywhere but the bed (like the sofa)   2) Avoid media use  Avoid using the computer, phone, television, or tablet in the hour before bed and in the middle of the night.  Anxiety & blue light both make it harder to fall asleep.  Silence the sound & vibration on your phone; ideally store it in another room when you sleep.  3) Make the room dark  An eye mask (also called sleep masks) may be helpful.   If an eye mask isn't comfortable, use blackout curtains. You can even tape dark garbage bags or tinfoil on your windows. It might look funny, but it does the trick!  Place nightlights in the path to your bathroom so you don't need to turn on bright lights when walking to the bathroom in the middle of the night.  4) Make the room cool  Warm temperatures can disrupt sleep.   If you have window shades or blinds, close them during the day to keep out the sun.   If possible, use fans and light bedding at night.  5) Make the room quiet   Reduce disruptive noises by using earplugs or a white-noise or nature-noise machine or app.  6) Wind down    Prepare your mind & body for sleep an hour before bedtime. For example, get in your  pajamas & try a mindfulness practice.      Other helpful resources:  Sleepio (www.sleepio.com): Digital therapy for insomnia  SHUTi (LocalRefinishing.com.cy): Digital therapy for insomnia  Quiet Your Mind & Get to Sleep by Riccardo Dubin Carney and Erline Levine

## 2022-08-16 NOTE — Progress Notes (Signed)
New GYN Visit: [facility]    CC: well woman, decreased libido     HPI: 41 y.o. [id number] with mild obesity, HTN MDD, presenting for follow-up for evaluation of well woman.   No dyspareunia, no urinary or constipation symptoms. No intermenstrual or postcoital spotting.   No family hx of ovarian, colon or breast cancer.     2006, partial abdominal hysterectomy, right fallopian tube and ovary removal for endometriosis.   Has left fallopian tube and ovary intact.     No bleeding.   Not having pelvic pain.   Mood will change, but no painful BM, dyspareunia.   19, 22, and 41 yo. Two of them live with her.   Feels safe.   Product/process development scientist for [employer], owns two agency, full Neurosurgeon.   Has a partner. Sexually active but low libido. Started SSRI two years ago.     Takes Gabapentin, Xanax, and hydroxyzine for sleep.   Working on sleep     Review of Systems:  General ROS: negative for fatigue, fever/chills, weight loss  Breast ROS: negative for breast lumps, nipple discharge  Gastrointestinal ROS: no abdominal pain, change in bowel habits  Genitourinary ROS: no dysuria, trouble voiding, or hematuria  Skin: denies rash or lesion  Psych: denies depression  All other systems reviewed and are negative     Past medical, surgical, family, social, and obstetric history reviewed.    Meds:    Current/Home Medications    ALPRAZOLAM (XANAX) 1 MG TABLET    Take 1 tablet (1 mg) by mouth 3 (three) times daily as needed    AMPHETAMINE-DEXTROAMPHETAMINE (ADDERALL XR) 30 MG 24 HR CAPSULE    Take 1 capsule (30 mg) by mouth every morning    DULOXETINE (CYMBALTA) 30 MG CAPSULE    Take 1 capsule (30 mg) by mouth daily    GABAPENTIN (NEURONTIN) 300 MG CAPSULE    Take 1 capsule (300 mg total) by mouth every 8 (eight) hours    HYDROXYZINE (ATARAX) 50 MG TABLET    Take 1 tablet (50 mg) by mouth nightly    VYVANSE 50 MG CAPSULE    Take 1 capsule (50 mg) by mouth every morning    ZIPRASIDONE (GEODON) 20 MG CAPSULE    Take 1 capsule (20 mg) by mouth  nightly       Allergies:   Allergies   Allergen Reactions    Morphine Itching       BP 137/87   Pulse 81   Temp 98.7 F (37.1 C)   Wt 241 lb (109.3 kg)   BMI 42.69 kg/m     Gen: NAD  Breasts: normal appearance, no masses or tenderness, No nipple retraction or dimpling, No nipple discharge or bleeding, Normal to palpation without dominant masses  Resp: No inc WOB  Abd: Soft, NT/ND  Ext: No LE edema  SSE: Normal external female genitalia, normal vaginal and cervical mucosa with no lesions or ulcerations, no blood within the vaginal vault       A/P:  -Normal breast exam today, mammo ordered - for dense breasts recommend 3D Tomo. Discussed importance of annual breast exam and breast awareness.  -Pap smear today. Discussed ACOG/USPTF/CDC recommendations for q62yr if normal or remote from nl pap.  -Discussed sexual health and libido and factors that can affect. Resources provided.   - Applauded tobacco cessation and weight loss   -PCP for other routine HCM  - Return to clinic 107yr or prn sooner  Lynnae Prude, MD/MBA  [facility]   [facility]  08/16/2022

## 2022-08-19 ENCOUNTER — Encounter (INDEPENDENT_AMBULATORY_CARE_PROVIDER_SITE_OTHER): Payer: Self-pay | Admitting: Licensed Professional Counselor

## 2022-08-19 ENCOUNTER — Encounter (HOSPITAL_BASED_OUTPATIENT_CLINIC_OR_DEPARTMENT_OTHER): Payer: Self-pay

## 2022-08-19 ENCOUNTER — Encounter (INDEPENDENT_AMBULATORY_CARE_PROVIDER_SITE_OTHER): Payer: Self-pay

## 2022-08-19 NOTE — Progress Notes (Signed)
Calls made to patient's psychiatrist and therapist re: psych clearance for bariatric surgery.-  Messages with return contact info on both clinician's voice mails.

## 2022-08-25 ENCOUNTER — Encounter (HOSPITAL_BASED_OUTPATIENT_CLINIC_OR_DEPARTMENT_OTHER): Payer: Self-pay

## 2022-08-29 ENCOUNTER — Encounter (HOSPITAL_BASED_OUTPATIENT_CLINIC_OR_DEPARTMENT_OTHER): Payer: Self-pay

## 2022-08-30 ENCOUNTER — Ambulatory Visit (HOSPITAL_BASED_OUTPATIENT_CLINIC_OR_DEPARTMENT_OTHER): Payer: BLUE CROSS/BLUE SHIELD | Admitting: Family

## 2022-08-31 NOTE — Progress Notes (Signed)
S:  Weight hx: Pt is a 41 y.o. female with morbid obesity presenting for initial nutrition evaluation for possible bariatric surgery. Pt interested in bypass/sleeve surgery.  Pt states desires weight loss surgery to help symptoms of co-morbid conditions and live a longer, more active lifestyle.  Pt has struggled with weight since childhoo.  Pt has tried the following diet and/or exercise programs:  weight watchers, calorie restriction.  Pt reports losing the most weight using calorie restriction.  Pt reports losing about 10 pounds.   Pt reports their highest weight recorded was: current weight.      Allergies:    Allergies   Allergen Reactions    Morphine Itching     Physical Activity: Unknown (03/28/2022)    Exercise Vital Sign     Days of Exercise per Week: 0 days     Minutes of Exercise per Session: Not on file     Food Insecurity: No Food Insecurity (03/28/2022)    Hunger Vital Sign     Worried About Running Out of Food in the Last Year: Never true     Ran Out of Food in the Last Year: Never true         O:  Ht:    Ht Readings from Last 1 Encounters:   07/06/22 5\' 3"      Wt:  241 lbs    BMI:  There is no height or weight on file to calculate BMI.   IBW:  126 lbs Excess Wt:  115 lbs ABW:  140 lbs    Vitamin/Mineral Deficiency Hx:  no  Pt is currently taking the following OTC supplements:  no.    Smoker:  no    Comorbidities:    Patient Active Problem List   Diagnosis    Fall    Class 2 obesity due to excess calories without serious comorbidity with body mass index (BMI) of 38.0 to 38.9 in adult    Adjustment reaction with anxiety and depression    Closed compression fracture of body of L1 vertebra    Intractable back pain    Headache    Anxiety    Major depressive disorder    Morbid obesity with BMI of 40.0-44.9, adult    Gastroesophageal reflux disease without esophagitis         Nutrition Diagnosis:  Pt presents with obesity related to a history of excessive energy intake, limited physical activity, and food and  nutrition related knowledge deficit as evidenced by report, recall, and BMI.      A:  Per report and diet recall, consumes a diet high in calorie.  Discussed necessary diet changes related to bariatric surgery.  Per pt insurance, pt is required to complete 3-6 months of pre-surgery weight loss classes and classes will help to strengthen knowledge deficit and post-operative weight loss management.  Pt comprehension level moderate and anticipated compliance moderate.  Pt receives my recommendation as a good candidate for bariatric surgery.  Would recommend this pt for WLS.    P:  We discussed nutritional expectations prior to bariatric surgery including insurance-required goals and reduced surgical risks associated with wt loss prior to surgery.  Pt was provided with an overview of pre-op and post-op dietary stages as well as lifelong vitamin/mineral supplementation requirements.  We discussed the value of tracking foods and measuring portions to increase awareness and accountability.  We also discussed the importance of consistent intake, appropriate protein intake and long-term commitment to physical activity.  Initial goals:  1.  Pt to continue with current weight loss program as required by insurance company.    2.  Pt to review nutrition plan post - operatively and contact RD with questions.  Contact information provided.      Pt verbalized understanding and did not voice any additional questions.  Spent a total of 30 minutes educating pt in a individual one-on-one setting.  Plan reviewed with surgeon.

## 2022-09-01 LAB — PAP SMEAR, THIN PREP WITH HR HPV: HPV DNA, high risk: DETECTED — AB

## 2022-09-02 ENCOUNTER — Ambulatory Visit (HOSPITAL_BASED_OUTPATIENT_CLINIC_OR_DEPARTMENT_OTHER): Payer: BLUE CROSS/BLUE SHIELD | Admitting: Surgery

## 2022-09-02 ENCOUNTER — Encounter (HOSPITAL_BASED_OUTPATIENT_CLINIC_OR_DEPARTMENT_OTHER): Payer: Self-pay

## 2022-09-02 ENCOUNTER — Encounter (HOSPITAL_BASED_OUTPATIENT_CLINIC_OR_DEPARTMENT_OTHER): Payer: Self-pay | Admitting: Surgery

## 2022-09-02 VITALS — BP 133/84 | HR 92 | Temp 97.4°F | Ht 63.0 in | Wt 246.0 lb

## 2022-09-02 DIAGNOSIS — Z6841 Body Mass Index (BMI) 40.0 and over, adult: Secondary | ICD-10-CM

## 2022-09-02 MED ORDER — APREPITANT 40 MG PO CAPS
40.0000 mg | ORAL_CAPSULE | Freq: Once | ORAL | 0 refills | Status: AC
Start: 2022-09-02 — End: 2022-09-02

## 2022-09-02 NOTE — Progress Notes (Signed)
[facility]  Second Visit    Patient Name: Victoria Woodward, Victoria Woodward  Age: 41 y.o.  Sex: female   DOB: [birth year]  MRN: [id number]    Progress Note:    Victoria Woodward presents today for a second visit.    She is planning to undergo Robotic Assisted Laparoscopic Sleeve Gastrectomy with Dr. Val Eagle.    Pre-op testing/requirements:  - All pre-operative testing was explained as well as the education requirement at [facility]).     - The patient was informed of the need to obtain ALL of the following PRIOR to meeting with surgeon pre-operatively:    - EGD: 8/23 mild chronic gastritis, H pylori negative, no hiatal hernia  - Abdominal u/s: needs, history of endometrioma excision and mesh in the abdominal wall 2006.   - CXR: needs  - EKG: needs   - Labs: ordered, pending   - Evaluation for sleep apnea: not officially been diagnosed. Needs sleep study, missed last appointment, [facility] [place].     - Smoking status: quit 2-3 months ago. Previously smoked marijuana for 5-6 years. Black and mild's daily.     Clearances:  - Medical clearance: pending   - Psychological clearance: 08/25/22  - Evaluation for cardiac clearance: none  - Additional clearance: Sleep medicine, pending above     Special Instructions:   All pre-operative testing was explained:  Medications, medical allergies, and health history were reviewed. All instructions were provided verbally or in writing.  She was instructed to avoid all anti-inflammatory medications for two weeks prior to surgery and indefinitiely thereafter.    DOS pending   She was instructed not to take any vitamins for one week prior to surgery.  She was instructed to avoid all immunosuppressive medications and estrogen-related hormones (excluding IUD) for 1 month pre/post operatively.  She was instructed to bring the CPAP machine to the hospital the day of surgery, if applicable.   No smoking at LEAST 8 weeks prior to  surgery and remain smoke-free indefinitely thereafter. This applies to cigarettes, vape, marijuana, e-cigarette etc. Discussed risks a/w smoking both pre- and post- operatively. Will plan for nicotine testing prior to surgery if applicable. Pt verbalized understanding and agrees with the plan.  For women of childbearing age: She was advised to use a reliable birth control method and AVOID PREGNANCY for 18 months after surgery.     Program completion attestation: Patient agrees and attests that he/she has attempted weight loss in the past without successful long-term weight reduction and has participated in the intensive multicomponent behavioral interventions designed to help achieve or maintain weight loss through a combination of dietary changes and increased physical activity. The intensive multicomponent behavioral intervention program had components focusing on nutrition, physical activity and behavioral modification and was supervised by behavioral therapists, psychologists, registered dietitians, exercise physiologists and other staff.    Pre-op nutrition: Pt aware of pre-op diet. Received sample pack of liquid diet. Pt will be on liquid diet for 2 weeks prior to surgery. Once pt is approved for surgery and has been scheduled, pt will purchase the pre-op diet from [facility] unless otherwise discussed with the Registered Dietitian. Pt verbalized understanding and desired compliance. All questions addressed to satisfaction.     Medications, medical allergies, and health history were reviewed.   All questions were answered to satisfaction    Past Medical History:     Past Medical History:   Diagnosis Date  Abnormal Pap smear of cervix     ADHD     Ankle fracture     left    Anxiety     Boxer's fracture     right    Claustrophobia     COVID-19 vaccine administered     Moderna x's 2    Depression     Eczema     Endometriosis 2006    s/p partial hysterectomy    Hx of back injury     Hypercholesterolemia      Hypertension     Insomnia     Morbid obesity with BMI of 40.0-44.9, adult        Past Surgical History:     Past Surgical History:   Procedure Laterality Date    CESAREAN SECTION  2003    EGD, BIOPSY N/A 07/06/2022    Procedure: EGD, BIOPSY;  Surgeon: Champ Mungo I, MD;  Location: [facility];  Service: General;  Laterality: N/A;    ENDOMETRIOSIS SURGERY  2006    had endometrioma removed from abdominal wall with mesh placement    HYSTERECTOMY  2006    partial hysterectomy    LIFT, ARM (COSMETIC) Bilateral 2017    Breast lift lipo in arms and abdomen    VERTEBROPLASTY/KYPHOPLASTY N/A 05/08/2020    Procedure: VERTEBROPLASTY/KYPHOPLASTY;  Surgeon: Shon Millet, MD;  Location: [facility];  Service: Interventional Radiology;  Laterality: N/A;       Family History:     Family History   Problem Relation Age of Onset    Dermatomyositis Mother 4    Heart failure Mother     Diabetes Father     Heart disease Father         CHF    Hyperlipidemia Father     Dementia Father     Diabetes Sister     Heart disease Brother         VALVE disease    Colon cancer Maternal Grandmother         6    Cancer Maternal Grandmother     Cancer Paternal Aunt         unkown       Allergies:     Allergies   Allergen Reactions    Morphine Itching       Medications:     Prior to Admission medications    Medication Sig Start Date End Date Taking? Authorizing Provider   ALPRAZolam Prudy Feeler) 1 MG tablet Take 1 tablet (1 mg) by mouth 3 (three) times daily as needed 08/26/20   [provider]   amphetamine-dextroamphetamine (ADDERALL XR) 30 MG 24 hr capsule Take 1 capsule (30 mg) by mouth every morning    [provider]   DULoxetine (CYMBALTA) 30 MG capsule Take 1 capsule (30 mg) by mouth daily 06/06/22   [provider]   gabapentin (NEURONTIN) 300 MG capsule Take 1 capsule (300 mg total) by mouth every 8 (eight) hours  Patient taking differently: Take 1 capsule (300 mg) by mouth nightly as needed (insomnia) 3  tablets at night 05/08/20   Gildardo Griffes, DO   hydrOXYzine (ATARAX) 50 MG tablet Take 1 tablet (50 mg) by mouth nightly    [provider]       Review of Systems:     Constitutional: negative for fevers, night sweats  Head and neck: denies blurry vision, dry mouth or hearing impairment  Respiratory: negative for SOB, cough  Cardiovascular: negative for chest pain,  palpitations  Gastrointestinal: negative for abdominal pain or bloating  Genitourinary: negative for hematuria, dysuria  Musculoskeletal: negative for bone pain, myalgias and stiff joints  Neurological: negative for dizziness, gait problems, headaches and memory problems  Behavioral/Psych: negative for fatigue, loss of interest in favorite activities, sleep disturbance  Endocrine: negative for temperature intolerance    Physical Exam:   There were no vitals filed for this visit.  Wt Readings from Last 10 Encounters:   08/16/22 109.3 kg (241 lb)   08/11/22 111.1 kg (245 lb)   07/06/22 109.8 kg (242 lb)   06/21/22 110.2 kg (243 lb)   06/13/22 109.8 kg (242 lb)   05/02/22 109.8 kg (242 lb)   04/13/22 107.4 kg (236 lb 12.8 oz)   12/09/20 92.5 kg (204 lb)   07/06/20 102.1 kg (225 lb)   05/22/20 107 kg (236 lb)       Appearance: Comfortable, no acute distress, well nourished  HEENT: Normocephalic, clear conjunctiva   Chest: Breathing unlabored  Neuro: A&Ox3  Psych: Calm, cooperative      Assessment:   Victoria Woodward is a 41 y.o. female with morbid obesity who presents for pre-operative visit for Robotic Assisted Laparoscopic Sleeve Gastrectomy.  All of Her questions were answered.     Plan:   - Complete all pre-operative requirements prior to upcoming visit with surgeon.    - Liquid diet prior to surgery.   - Prescription for Emend given to patient. Patient instructed to take medication 3 hours prior to procedure to help with post-operative nausea.  - Return to the office for nutritional counseling and education as well as for a final  pre-operative visit with Dr. Val Eagle .     Mayer Camel, DO  PGY-6 Bariatric Surgery Fellow  [facility]  09/02/2022 9:12 AM

## 2022-09-05 ENCOUNTER — Telehealth (INDEPENDENT_AMBULATORY_CARE_PROVIDER_SITE_OTHER): Payer: Self-pay

## 2022-09-05 NOTE — Telephone Encounter (Signed)
Pre-Authorization Started:    Went on Availity and initiated pre-certification for 55208 sleeve and [id number] omentopexy with Dr. Willa Rough DOS (Dummy Date of Surgery): 10/27/22  Reference # Pended  Clinicals sent through website

## 2022-09-20 ENCOUNTER — Encounter (INDEPENDENT_AMBULATORY_CARE_PROVIDER_SITE_OTHER): Payer: Self-pay

## 2022-09-21 NOTE — Telephone Encounter (Signed)
Final Determination:    Received a fax from [employer].  Patient has been approved for  70350 lap sleeve gastrectomy and 49329 omentopexy with Dr. Val Eagle   DOS:10/27/22  Ref# [id number]  Routed message to Maralyn Sago at [facility] office to contact the patient and schedule surgery and appointments.

## 2022-09-23 ENCOUNTER — Telehealth (HOSPITAL_BASED_OUTPATIENT_CLINIC_OR_DEPARTMENT_OTHER): Payer: Self-pay

## 2022-09-23 NOTE — Telephone Encounter (Signed)
Tried to reach patient to schedule surgery, 2 hr nutrition class and remaining pre-op, post op appointments. LM

## 2022-11-01 ENCOUNTER — Telehealth (INDEPENDENT_AMBULATORY_CARE_PROVIDER_SITE_OTHER): Payer: BLUE CROSS/BLUE SHIELD

## 2022-11-02 ENCOUNTER — Telehealth (INDEPENDENT_AMBULATORY_CARE_PROVIDER_SITE_OTHER): Payer: BLUE CROSS/BLUE SHIELD

## 2022-11-02 DIAGNOSIS — Z719 Counseling, unspecified: Secondary | ICD-10-CM

## 2022-11-02 DIAGNOSIS — Z713 Dietary counseling and surveillance: Secondary | ICD-10-CM

## 2022-11-02 DIAGNOSIS — Z6841 Body Mass Index (BMI) 40.0 and over, adult: Secondary | ICD-10-CM

## 2022-11-02 NOTE — Progress Notes (Signed)
Verbal consent has been obtained from the patient to conduct a telephone/video visit encounter to minimize exposure to COVID-19: YES     S:   Victoria Woodward is a pleasant 41 y.o. female with obesity presenting for follow up nutrition visit for surgical weight loss.     Pt is approved for bariatric surgery but wanted an additional visit to discuss her worries about losing too much weight with this pathway. We discussed expected weight loss outcomes with non-surgical vs surgical weight loss. Pt's goal weight is 170-175 lb, which I assured she is more likely to reach with bariatric surgery.   We also discussed the importance of following up long term with her dietitian to stay on track. I assured her we would increase calories via snacks to maintain weight loss and prevent further weight loss once she reaches her goal weight.   Pt states she feels more comfortable knowing she has more control over her diet intake than she previously perceived after surgery.     O:  Ht:    Ht Readings from Last 1 Encounters:   09/02/22 5\' 3"      Wt:  264 lb  BMI:  There is no height or weight on file to calculate BMI.     Wt Readings from Last 10 Encounters:   09/02/22 246 lb   08/16/22 241 lb   08/11/22 245 lb   07/06/22 242 lb   06/21/22 243 lb   06/13/22 242 lb   05/02/22 242 lb   04/13/22 236 lb 12.8 oz   12/09/20 204 lb   07/06/20 225 lb       Allergies:    Allergies   Allergen Reactions    Morphine Itching         A:   Nutrition Diagnosis: obesity related to a history of excessive energy intake, limited physical activity, knowledge deficit as evidenced by report, dietary recall, and BMI.      Topics discussed:   Pt would like to continue with bariatric surgery ready to schedule her surgery. She is not interested in non-surgical weight loss. All pt's questions were answered.   I told pt I will reach out to our surgery scheduler Maralyn Sago so she can schedule her surgery.     P:      1. Schedule bariatric surgery.     Pt verbalized  understanding and did not voice any additional questions.  Spent a total of 11 minutes educating pt in a individual one-on-one setting.  Plan reviewed with DO/NP.

## 2022-11-03 ENCOUNTER — Other Ambulatory Visit (HOSPITAL_BASED_OUTPATIENT_CLINIC_OR_DEPARTMENT_OTHER): Payer: Self-pay | Admitting: Surgery

## 2022-11-03 ENCOUNTER — Telehealth (HOSPITAL_BASED_OUTPATIENT_CLINIC_OR_DEPARTMENT_OTHER): Payer: Self-pay

## 2022-11-03 NOTE — Telephone Encounter (Signed)
Spoke to patient scheduled pre-op, post op and surgery for 1/24 Dr. Val Eagle  Discussed remaining test  Medical clearance  Sleep study  XR Chest  US Abdomen  EKG  Labs  2 wk diet

## 2022-11-04 NOTE — Telephone Encounter (Signed)
Called [employer] and spoke with Sue Lush R who updated DOS to 12/07/22

## 2022-11-08 ENCOUNTER — Telehealth: Payer: Self-pay | Admitting: Registered"

## 2022-11-08 ENCOUNTER — Other Ambulatory Visit (FREE_STANDING_LABORATORY_FACILITY): Payer: BLUE CROSS/BLUE SHIELD

## 2022-11-08 DIAGNOSIS — R7401 Elevation of levels of liver transaminase levels: Secondary | ICD-10-CM

## 2022-11-08 DIAGNOSIS — Z6841 Body Mass Index (BMI) 40.0 and over, adult: Secondary | ICD-10-CM

## 2022-11-08 LAB — COMPREHENSIVE METABOLIC PANEL
ALT: 17 U/L (ref 0–55)
AST (SGOT): 26 U/L (ref 5–41)
Albumin/Globulin Ratio: 1 (ref 0.9–2.2)
Albumin: 3.8 g/dL (ref 3.5–5.0)
Alkaline Phosphatase: 92 U/L (ref 37–117)
Anion Gap: 10 (ref 5.0–15.0)
BUN: 19 mg/dL (ref 7.0–21.0)
Bilirubin, Total: 0.1 mg/dL — ABNORMAL LOW (ref 0.2–1.2)
CO2: 22 mEq/L (ref 17–29)
Calcium: 9.1 mg/dL (ref 8.5–10.5)
Chloride: 109 mEq/L (ref 99–111)
Creatinine: 0.9 mg/dL (ref 0.4–1.0)
Globulin: 3.8 g/dL — ABNORMAL HIGH (ref 2.0–3.6)
Glucose: 99 mg/dL (ref 70–100)
Potassium: 4.7 mEq/L (ref 3.5–5.3)
Protein, Total: 7.6 g/dL (ref 6.0–8.3)
Sodium: 141 mEq/L (ref 135–145)
eGFR: >60 mL/min/{1.73_m2} (ref >=60)

## 2022-11-08 LAB — LIPID PANEL
Cholesterol / HDL Ratio: 2.8 Index
Cholesterol: 278 mg/dL — ABNORMAL HIGH (ref 0–199)
HDL: 99 mg/dL (ref 40–9999)
LDL Calculated: 139 mg/dL — ABNORMAL HIGH (ref 0–99)
Triglycerides: 201 mg/dL — ABNORMAL HIGH (ref 34–149)
VLDL Calculated: 40 mg/dL (ref 10–40)

## 2022-11-08 LAB — CBC
Absolute NRBC: 0 10*3/uL (ref 0.00–0.00)
Hematocrit: 40.4 % (ref 34.7–43.7)
Hgb: 12.7 g/dL (ref 11.4–14.8)
MCH: 27.6 pg (ref 25.1–33.5)
MCHC: 31.4 g/dL — ABNORMAL LOW (ref 31.5–35.8)
MCV: 87.8 fL (ref 78.0–96.0)
MPV: 11.8 fL (ref 8.9–12.5)
Nucleated RBC: 0 /100 WBC (ref 0.0–0.0)
Platelets: 251 10*3/uL (ref 142–346)
RBC: 4.6 10*6/uL (ref 3.90–5.10)
RDW: 16 % — ABNORMAL HIGH (ref 11–15)
WBC: 7.4 10*3/uL (ref 3.10–9.50)

## 2022-11-08 LAB — HEMOGLOBIN A1C
Average Estimated Glucose: 102.5 mg/dL
Hemoglobin A1C: 5.2 % (ref 4.6–5.6)

## 2022-11-08 LAB — FOLATE: Folate: 8.2 ng/mL

## 2022-11-08 LAB — GGT: GGT: 48 U/L (ref 10–70)

## 2022-11-08 LAB — IRON PROFILE
Iron Saturation: 28 % (ref 15–50)
Iron: 70 ug/dL (ref 32–157)
TIBC: 254 ug/dL — ABNORMAL LOW (ref 265–497)
UIBC: 184 ug/dL (ref 126–382)

## 2022-11-08 LAB — PT/INR
PT INR: 0.8 — ABNORMAL LOW (ref 0.9–1.1)
PT: 9.8 s — ABNORMAL LOW (ref 10.1–12.9)

## 2022-11-08 LAB — HEMOLYSIS INDEX: Hemolysis Index: 31 Index — ABNORMAL HIGH (ref 0–24)

## 2022-11-08 LAB — VITAMIN D,25 OH,TOTAL: Vitamin D, 25 OH, Total: 7 ng/mL — ABNORMAL LOW (ref 30–100)

## 2022-11-08 LAB — VITAMIN B12: Vitamin B-12: 388 pg/mL (ref 211–911)

## 2022-11-08 LAB — THYROID STIMULATING HORMONE (TSH), REFLEX ON ABNORMAL TO FREE T4, SERUM: TSH, Abn Reflex to Free T4, Serum: 0.68 u[IU]/mL (ref 0.35–4.94)

## 2022-11-09 ENCOUNTER — Other Ambulatory Visit (HOSPITAL_BASED_OUTPATIENT_CLINIC_OR_DEPARTMENT_OTHER): Payer: Self-pay | Admitting: Surgery

## 2022-11-09 ENCOUNTER — Encounter (INDEPENDENT_AMBULATORY_CARE_PROVIDER_SITE_OTHER): Payer: Self-pay

## 2022-11-09 ENCOUNTER — Other Ambulatory Visit (FREE_STANDING_LABORATORY_FACILITY): Payer: BLUE CROSS/BLUE SHIELD

## 2022-11-09 DIAGNOSIS — Z6841 Body Mass Index (BMI) 40.0 and over, adult: Secondary | ICD-10-CM

## 2022-11-12 LAB — VITAMIN A: Vitamin A: 72 ug/dL (ref 38–98)

## 2022-11-13 LAB — WHOLE BLOOD VITAMIN B1 (THIAMINE): Vitamin B1, Whole Blood: 65 nmol/L — ABNORMAL LOW (ref 78–185)

## 2022-11-16 ENCOUNTER — Ambulatory Visit: Payer: BLUE CROSS/BLUE SHIELD
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
## 2022-11-17 ENCOUNTER — Telehealth (INDEPENDENT_AMBULATORY_CARE_PROVIDER_SITE_OTHER): Payer: Self-pay | Admitting: Registered"

## 2022-11-17 NOTE — Telephone Encounter (Signed)
Called [facility] and spoke with Tillie Rung to have DOS updated to 12/07/22.

## 2022-11-21 ENCOUNTER — Encounter (INDEPENDENT_AMBULATORY_CARE_PROVIDER_SITE_OTHER): Payer: Self-pay

## 2022-11-22 ENCOUNTER — Ambulatory Visit
Admission: RE | Admit: 2022-11-22 | Discharge: 2022-11-22 | Disposition: A | Discharge status: Home / Self Care | Payer: BLUE CROSS/BLUE SHIELD | Source: Ambulatory Visit | Attending: Surgery | Admitting: Surgery

## 2022-11-22 DIAGNOSIS — Z6841 Body Mass Index (BMI) 40.0 and over, adult: Secondary | ICD-10-CM | POA: Insufficient documentation

## 2022-11-23 ENCOUNTER — Telehealth (INDEPENDENT_AMBULATORY_CARE_PROVIDER_SITE_OTHER): Payer: Self-pay | Admitting: Registered"

## 2022-11-23 ENCOUNTER — Encounter (INDEPENDENT_AMBULATORY_CARE_PROVIDER_SITE_OTHER): Payer: Self-pay

## 2022-11-23 DIAGNOSIS — Z719 Counseling, unspecified: Secondary | ICD-10-CM

## 2022-11-23 DIAGNOSIS — Z6841 Body Mass Index (BMI) 40.0 and over, adult: Secondary | ICD-10-CM

## 2022-11-28 NOTE — Progress Notes (Signed)
This class was conducted via [employer]    S:  Pt presents with questions about post op diet phases, supplements and lifestyle after weight loss surgery.  Pt presents for 2 hour pre operative education class.    A:  Pt has nutrition knowledge deficit regarding pre op and post op nutrition guidelines for weight loss surgery.  Pt has been educated on the following topics:  Anatomy review.  Necessary behavior/eating modifications to avoid complications.  Post op liquid diet phase  Post op mushy/soft foods diet phase  Vitamin/mineral supplementation and consequences of non compliance.  Reviewed symptoms of deficiencies.  Protein supplementation - requirements of protein supplements, amounts needed per surgery/pt, and consequences of non compliance.  Review of the nutrition fact panel, what to look for.  Review of dumping syndrome and it's effects in addition to trigger foods.  Review of possible post operative complications, tips/techniques to manage them.  Review of appropriate foods and meal plans for each diet phase.  Quick review of physical activity and the guidelines pt needs to follow post operatively.    P:  1.  Pt to follow up with MD and RD for final pre op visit.    2.  Contact information provided.  Pt to contact PRN.    Spent a total of 90 minutes educating pt in a group setting.  Plan reviewed with surgeon.

## 2022-11-30 ENCOUNTER — Encounter (INDEPENDENT_AMBULATORY_CARE_PROVIDER_SITE_OTHER): Payer: Self-pay | Admitting: Internal Medicine

## 2022-11-30 ENCOUNTER — Ambulatory Visit (INDEPENDENT_AMBULATORY_CARE_PROVIDER_SITE_OTHER): Payer: BLUE CROSS/BLUE SHIELD | Admitting: Surgery

## 2022-11-30 ENCOUNTER — Encounter (HOSPITAL_BASED_OUTPATIENT_CLINIC_OR_DEPARTMENT_OTHER): Payer: Self-pay | Admitting: Surgery

## 2022-11-30 VITALS — BP 129/88 | HR 80 | Temp 98.0°F | Ht 63.0 in | Wt 270.0 lb

## 2022-11-30 DIAGNOSIS — I10 Essential (primary) hypertension: Secondary | ICD-10-CM

## 2022-11-30 DIAGNOSIS — E6609 Other obesity due to excess calories: Secondary | ICD-10-CM

## 2022-11-30 DIAGNOSIS — E559 Vitamin D deficiency, unspecified: Secondary | ICD-10-CM | POA: Insufficient documentation

## 2022-11-30 DIAGNOSIS — E519 Thiamine deficiency, unspecified: Secondary | ICD-10-CM

## 2022-11-30 DIAGNOSIS — Z6841 Body Mass Index (BMI) 40.0 and over, adult: Secondary | ICD-10-CM

## 2022-11-30 DIAGNOSIS — K219 Gastro-esophageal reflux disease without esophagitis: Secondary | ICD-10-CM

## 2022-11-30 MED ORDER — ACETAMINOPHEN 160 MG/5ML PO ELIX: 500.0000 mg | ORAL_SOLUTION | Freq: Four times a day (QID) | ORAL | 0 refills | Status: DC

## 2022-11-30 MED ORDER — ONDANSETRON 4 MG PO TBDP: 4.0000 mg | ORAL_TABLET | Freq: Three times a day (TID) | ORAL | 0 refills | Status: DC | PRN

## 2022-11-30 MED ORDER — ENOXAPARIN SODIUM 40 MG/0.4ML IJ SOSY
40.0000 mg | PREFILLED_SYRINGE | Freq: Every day | INTRAMUSCULAR | 0 refills | Status: AC
Start: 2022-11-30 — End: 2022-12-14

## 2022-11-30 MED ORDER — LANSOPRAZOLE 30 MG PO CPDR: 30.0000 mg | DELAYED_RELEASE_CAPSULE | Freq: Every day | ORAL | 0 refills | Status: DC

## 2022-11-30 MED ORDER — THIAMINE HCL 100 MG/ML IJ SOLN
100.0000 mg | INTRAMUSCULAR | Status: AC
Start: 2022-11-30 — End: 2022-11-30
  Administered 2022-11-30: 100 mg via INTRAMUSCULAR

## 2022-11-30 MED ORDER — OXYCODONE HCL 5 MG/5ML PO SOLN
5.0000 mg | Freq: Four times a day (QID) | ORAL | 0 refills | Status: DC | PRN
Start: 2022-11-30 — End: 2022-12-08

## 2022-11-30 MED ORDER — VITAMIN D (ERGOCALCIFEROL) 1.25 MG (50000 UT) PO CAPS
50000.0000 [IU] | ORAL_CAPSULE | ORAL | 0 refills | Status: DC
Start: 2022-11-30 — End: 2022-12-08

## 2022-11-30 NOTE — H&P (View-Only) (Signed)
Drs. Moazzez, Cyndra Feinberg, [place], and Pourshojae   [address], [address]   [place], [place] [address]   (O) [phone]   (F) [phone]     [address], [address]   [place], [place] [address]   (O) [phone]   (F) [phone]    The patient is a pleasant 42 y.o. year old morbidly obese female  who has failed multiple previous attempts at conservative weight loss. She has opted to proceed with robotic/laparoscopic SG-Sleeve and possible hiatal hernia repair as a surgical weight loss option.  All other options were discussed in detail. The patient has undergone an extensive preoperative education and medical clearance along with dietary and psychiatric counseling.  EGD--No gross lesions, no hiatal hernia  Labs--low Vit D and low Thiamine, rest labs fine  Psych clearance reviewed  Med clearance pending    Bariatric comorbidities present: BMI: 46 GERD, HTN, hypercholesterolemia, and Vit D/B1 def    Patient Active Problem List    Diagnosis Date Noted    Morbid obesity with BMI of 45.0-49.9, adult 11/30/2022    Vitamin D deficiency 11/30/2022    Thiamine deficiency 11/30/2022    Gastroesophageal reflux disease without esophagitis 05/09/2022    Morbid obesity with BMI of 40.0-44.9, adult     Anxiety 03/28/2022    Major depressive disorder 03/28/2022    Intractable back pain 05/07/2020    Headache 05/07/2020    Closed compression fracture of body of L1 vertebra 05/06/2020    Fall 04/15/2020    Adjustment reaction with anxiety and depression 08/25/2014    Class 2 obesity due to excess calories without serious comorbidity with body mass index (BMI) of 38.0 to 38.9 in adult 07/08/2013     Past Medical History:   Diagnosis Date    Abnormal Pap smear of cervix     ADHD     Ankle fracture     left    Anxiety     Boxer's fracture     right    Claustrophobia     COVID-19 vaccine administered     Moderna x's 2    Depression     Eczema     Endometriosis 2006    s/p partial hysterectomy    Hx of back injury      Hypercholesterolemia     Hypertension     no meds    Insomnia     Morbid obesity with BMI of 40.0-44.9, adult       Past Surgical History:   Procedure Laterality Date    CESAREAN SECTION  2003    EGD, BIOPSY N/A 07/06/2022    Procedure: EGD, BIOPSY;  Surgeon: Edrick Whitehorn I, MD;  Location: [facility];  Service: General;  Laterality: N/A;    ENDOMETRIOSIS SURGERY  2006    had endometrioma removed from abdominal wall with mesh placement    HYSTERECTOMY  2006    partial hysterectomy    LIFT, ARM (COSMETIC) Bilateral 2017    Breast lift lipo in arms and abdomen    VERTEBROPLASTY/KYPHOPLASTY N/A 05/08/2020    Procedure: VERTEBROPLASTY/KYPHOPLASTY;  Surgeon: Greenberg, Edward David, MD;  Location: [facility];  Service: Interventional Radiology;  Laterality: N/A;      Outpatient Medications Marked as Taking for the 11/30/22 encounter (Clinical Support) with Shivali Quackenbush I, MD   Medication Sig Dispense Refill    acetaminophen (TYLENOL) 160 MG/5ML elixir Take 15.6 mLs (500 mg) by mouth every 6 (six) hours 473 mL 0      ALPRAZolam (XANAX) 1 MG tablet Take 1 tablet (1 mg) by mouth 3 (three) times daily as needed      amphetamine-dextroamphetamine (ADDERALL XR) 30 MG 24 hr capsule Take 2 capsules (60 mg) by mouth every morning      asenapine maleate (SAPHRIS) 5 MG SL Tab TAKE 1 TABLET BY MOUTH EVERY EVENING FOR SLEEP      DULoxetine (CYMBALTA) 30 MG capsule Take 1 capsule (30 mg) by mouth every morning      gabapentin (NEURONTIN) 300 MG capsule Take 1 capsule (300 mg total) by mouth every 8 (eight) hours (Patient taking differently: Take 1 capsule (300 mg) by mouth nightly as needed (insomnia) 3 tablets at night) 21 capsule 0    vitamin D, ergocalciferol, (DRISDOL) 50000 UNIT Cap Take 1 capsule (50,000 Units) by mouth once a week for 4 doses 4 capsule 0     Current Facility-Administered Medications for the 11/30/22 encounter (Clinical Support) with Jceon Alverio I, MD   Medication Dose Route Frequency Provider Last Rate Last Admin     thiamine (B-1) injection 100 mg  100 mg Intramuscular Q5 Min Raychel Dowler I, MD         Allergies   Allergen Reactions    Morphine Itching      Social History     Tobacco Use    Smoking status: Former     Types: Cigars     Quit date: 2022     Years since quitting: 2.0    Smokeless tobacco: Never    Tobacco comments:     1 "black" per day   Substance Use Topics    Alcohol use: Not Currently     Comment: occasional, stopped drinking heavy 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 (daily shots 7)      Family History   Problem Relation Age of Onset    Dermatomyositis Mother 66    Heart failure Mother     Diabetes Father     Heart disease Father         CHF    Hyperlipidemia Father     Dementia Father     Diabetes Sister     Heart disease Brother         VALVE disease    Colon cancer Maternal Grandmother         82    Cancer Maternal Grandmother     Cancer Paternal Aunt         unkown        Review of Systems  Constitutional: negative for fevers, night sweats  Respiratory: negative for SOB, cough  Cardiovascular: negative for chest pain, palpitations  Gastrointestinal: negative for dysphagia, EGD negative  Genitourinary:negative for hematuria, dysuria  Musculoskeletal:negative for bone pain, myalgias and stiff joints  Neurological: negative for dizziness, gait problems, headaches and memory problems  Behavioral/Psych: negative for fatigue or anxiety  Endocrine: negative for temperature intolerance  Hematologic:  Negative for DVT/PE    Objective:  Vital signs in last 24 hours:    BP 129/88 (BP Site: Left arm, Patient Position: Sitting, Cuff Size: Large)   Pulse 80   Temp 98 F (36.7 C) (Temporal)   Ht 5' 3"   Wt 270 lb   BMI 47.83 kg/m   W/ female chaperone present during entire examination:  General Appearance:    Alert, cooperative, no distress, obese   Head:    Normocephalic, without obvious abnormality, atraumatic   Eyes:     EOM's intact, no scleral icterus,                                                           Assessment:  Morbid  obesity  HTN  GERD  Vit D/B1 def        Plan:  This is a 42 year old morbidly obese female who has failed multiple previous attempts at conservative weight loss and has opted to proceed with robotic/laparoscopicSG-Sleeve and possible hiatal hernia repair.  All questions and concerns were addressed.  The risks, benefits and alternate options were discussed with in detail.  The risks include but are not limited to bleeding, infection, leakage, sepsis, shock, wound infection, wound herniation, DVT, PE, death, as well as unforeseen conditions such as  inadequate weight loss, excessive weight loss, weight regain, stricture, ulcer, etc.      I reviewed her home medications.  I discussed which medications to take on the day of surgery and which ones to hold.  In addition, I offered the use of Emend to be given 2-3 hrs prior to surgery to reduce the amount of PONV.    We have discussed the liquid diet prior to surgery as they are already consuming the protein shakes. This is needed to increase their protein stores and allow for reductions of the visceral fat (intra-abdominal and hepatic sources).  In addition, post surgery they will need several medications.  1) VTE prophylaxis, Lovenox which is weight dependant.    2)PPI therapy to reduce post-operative reflux and ulceration for about 3 months  3)Anti-nausea medication as needed  4)Pain management--intra-op pt will under long acting TAPP block.  In addition, they will be prescribed a pain regimen with combination of narcotic and non-narcotic to reduce the need for narcotics.      Patient wishes to proceed with above mentioned plan and surgery at this time.      Maryn Freelove I. Clothilde Tippetts, MD

## 2022-11-30 NOTE — Progress Notes (Signed)
Drs. Missy Sabins, [facility], and [address], [address]   [place], [place] [address]   [phone]   (F) [place], [address]   [place], [place] [address]   [phone]   [phone]    The patient is a pleasant 42 y.o. year old morbidly obese female  who has failed multiple previous attempts at conservative weight loss. She has opted to proceed with robotic/laparoscopic SG-Sleeve and possible hiatal hernia repair as a surgical weight loss option.  All other options were discussed in detail. The patient has undergone an extensive preoperative education and medical clearance along with dietary and psychiatric counseling.  EGD--No gross lesions, no hiatal hernia  Labs--low Vit D and low Thiamine, rest labs fine  Psych clearance reviewed  Med clearance pending    Bariatric comorbidities present: BMI: 46 GERD, HTN, hypercholesterolemia, and Vit D/B1 def    Patient Active Problem List    Diagnosis Date Noted    Morbid obesity with BMI of 45.0-49.9, adult 11/30/2022    Vitamin D deficiency 11/30/2022    Thiamine deficiency 11/30/2022    Gastroesophageal reflux disease without esophagitis 05/09/2022    Morbid obesity with BMI of 40.0-44.9, adult     Anxiety 03/28/2022    Major depressive disorder 03/28/2022    Intractable back pain 05/07/2020    Headache 05/07/2020    Closed compression fracture of body of L1 vertebra 05/06/2020    Fall 04/15/2020    Adjustment reaction with anxiety and depression 08/25/2014    Class 2 obesity due to excess calories without serious comorbidity with body mass index (BMI) of 38.0 to 38.9 in adult 07/08/2013     Past Medical History:   Diagnosis Date    Abnormal Pap smear of cervix     ADHD     Ankle fracture     left    Anxiety     Boxer's fracture     right    Claustrophobia     COVID-19 vaccine administered     Moderna x's 2    Depression     Eczema     Endometriosis 2006    s/p partial hysterectomy    Hx of back injury      Hypercholesterolemia     Hypertension     no meds    Insomnia     Morbid obesity with BMI of 40.0-44.9, adult       Past Surgical History:   Procedure Laterality Date    CESAREAN SECTION  2003    EGD, BIOPSY N/A 07/06/2022    Procedure: EGD, BIOPSY;  Surgeon: Dianna Limbo I, MD;  Location: [facility];  Service: General;  Laterality: N/A;    ENDOMETRIOSIS SURGERY  2006    had endometrioma removed from abdominal wall with mesh placement    HYSTERECTOMY  2006    partial hysterectomy    LIFT, ARM (COSMETIC) Bilateral 2017    Breast lift lipo in arms and abdomen    VERTEBROPLASTY/KYPHOPLASTY N/A 05/08/2020    Procedure: VERTEBROPLASTY/KYPHOPLASTY;  Surgeon: Boyd Kerbs, MD;  Location: [facility];  Service: Interventional Radiology;  Laterality: N/A;      Outpatient Medications Marked as Taking for the 11/30/22 encounter (Clinical Support) with Hunt Oris, MD   Medication Sig Dispense Refill    acetaminophen (TYLENOL) 160 MG/5ML elixir Take 15.6 mLs (500 mg) by mouth every 6 (six) hours 473 mL 0  ALPRAZolam (XANAX) 1 MG tablet Take 1 tablet (1 mg) by mouth 3 (three) times daily as needed      amphetamine-dextroamphetamine (ADDERALL XR) 30 MG 24 hr capsule Take 2 capsules (60 mg) by mouth every morning      asenapine maleate (SAPHRIS) 5 MG SL Tab TAKE 1 TABLET BY MOUTH EVERY EVENING FOR SLEEP      DULoxetine (CYMBALTA) 30 MG capsule Take 1 capsule (30 mg) by mouth every morning      gabapentin (NEURONTIN) 300 MG capsule Take 1 capsule (300 mg total) by mouth every 8 (eight) hours (Patient taking differently: Take 1 capsule (300 mg) by mouth nightly as needed (insomnia) 3 tablets at night) 21 capsule 0    vitamin D, ergocalciferol, (DRISDOL) 50000 UNIT Cap Take 1 capsule (50,000 Units) by mouth once a week for 4 doses 4 capsule 0     Current Facility-Administered Medications for the 11/30/22 encounter (Clinical Support) with Hunt Oris, MD   Medication Dose Route Frequency Provider Last Rate Last Admin     thiamine (B-1) injection 100 mg  100 mg Intramuscular Q5 Min Qais Jowers I, MD         Allergies   Allergen Reactions    Morphine Itching      Social History     Tobacco Use    Smoking status: Former     Types: Cigars     Quit date: 2022     Years since quitting: 2.0    Smokeless tobacco: Never    Tobacco comments:     1 "black" per day   Substance Use Topics    Alcohol use: Not Currently     Comment: occasional, stopped drinking heavy 1-77yrs ago (daily shots 7)      Family History   Problem Relation Age of Onset    Dermatomyositis Mother 72    Heart failure Mother     Diabetes Father     Heart disease Father         CHF    Hyperlipidemia Father     Dementia Father     Diabetes Sister     Heart disease Brother         VALVE disease    Colon cancer Maternal Grandmother         82    Cancer Maternal Grandmother     Cancer Paternal Aunt         unkown        Review of Systems  Constitutional: negative for fevers, night sweats  Respiratory: negative for SOB, cough  Cardiovascular: negative for chest pain, palpitations  Gastrointestinal: negative for dysphagia, EGD negative  Genitourinary:negative for hematuria, dysuria  Musculoskeletal:negative for bone pain, myalgias and stiff joints  Neurological: negative for dizziness, gait problems, headaches and memory problems  Behavioral/Psych: negative for fatigue or anxiety  Endocrine: negative for temperature intolerance  Hematologic:  Negative for DVT/PE    Objective:  Vital signs in last 24 hours:    BP 129/88 (BP Site: Left arm, Patient Position: Sitting, Cuff Size: Large)   Pulse 80   Temp 98 F (36.7 C) (Temporal)   Ht 5\' 3"    Wt 270 lb   BMI 47.83 kg/m   W/ female chaperone present during entire examination:  General Appearance:    Alert, cooperative, no distress, obese   Head:    Normocephalic, without obvious abnormality, atraumatic   Eyes:     EOM's intact, no scleral icterus,  Assessment:  Morbid  obesity  HTN  GERD  Vit D/B1 def        Plan:  This is a 42 year old morbidly obese female who has failed multiple previous attempts at conservative weight loss and has opted to proceed with robotic/laparoscopicSG-Sleeve and possible hiatal hernia repair.  All questions and concerns were addressed.  The risks, benefits and alternate options were discussed with in detail.  The risks include but are not limited to bleeding, infection, leakage, sepsis, shock, wound infection, wound herniation, DVT, PE, death, as well as unforeseen conditions such as  inadequate weight loss, excessive weight loss, weight regain, stricture, ulcer, etc.      I reviewed her home medications.  I discussed which medications to take on the day of surgery and which ones to hold.  In addition, I offered the use of Emend to be given 2-3 hrs prior to surgery to reduce the amount of PONV.    We have discussed the liquid diet prior to surgery as they are already consuming the protein shakes. This is needed to increase their protein stores and allow for reductions of the visceral fat (intra-abdominal and hepatic sources).  In addition, post surgery they will need several medications.  1) VTE prophylaxis, Lovenox which is weight dependant.    2)PPI therapy to reduce post-operative reflux and ulceration for about 3 months  3)Anti-nausea medication as needed  4)Pain management--intra-op pt will under long acting TAPP block.  In addition, they will be prescribed a pain regimen with combination of narcotic and non-narcotic to reduce the need for narcotics.      Patient wishes to proceed with above mentioned plan and surgery at this time.      Tommye Standard, MD

## 2022-12-01 ENCOUNTER — Encounter (HOSPITAL_BASED_OUTPATIENT_CLINIC_OR_DEPARTMENT_OTHER): Payer: Self-pay | Admitting: Surgery

## 2022-12-01 ENCOUNTER — Encounter (INDEPENDENT_AMBULATORY_CARE_PROVIDER_SITE_OTHER): Payer: Self-pay

## 2022-12-02 NOTE — PSS Phone Screening (Signed)
Documentation already in chart:  11/22/22: CXR;US ABD. - see EPIC  11/30/22:Surgeon's H&P - see EPIC

## 2022-12-04 ENCOUNTER — Encounter (HOSPITAL_BASED_OUTPATIENT_CLINIC_OR_DEPARTMENT_OTHER): Payer: Self-pay | Admitting: Surgery

## 2022-12-04 ENCOUNTER — Encounter (INDEPENDENT_AMBULATORY_CARE_PROVIDER_SITE_OTHER): Payer: Self-pay

## 2022-12-05 ENCOUNTER — Encounter (INDEPENDENT_AMBULATORY_CARE_PROVIDER_SITE_OTHER): Payer: Self-pay

## 2022-12-05 ENCOUNTER — Encounter (HOSPITAL_BASED_OUTPATIENT_CLINIC_OR_DEPARTMENT_OTHER): Payer: Self-pay | Admitting: Registered"

## 2022-12-05 ENCOUNTER — Ambulatory Visit (HOSPITAL_BASED_OUTPATIENT_CLINIC_OR_DEPARTMENT_OTHER): Payer: Self-pay

## 2022-12-05 ENCOUNTER — Encounter (INDEPENDENT_AMBULATORY_CARE_PROVIDER_SITE_OTHER): Payer: Self-pay | Admitting: Internal Medicine

## 2022-12-05 ENCOUNTER — Telehealth (INDEPENDENT_AMBULATORY_CARE_PROVIDER_SITE_OTHER): Payer: BLUE CROSS/BLUE SHIELD | Admitting: Registered"

## 2022-12-05 ENCOUNTER — Institutional Professional Consult (permissible substitution) (INDEPENDENT_AMBULATORY_CARE_PROVIDER_SITE_OTHER): Payer: BLUE CROSS/BLUE SHIELD | Admitting: Internal Medicine

## 2022-12-05 VITALS — BP 163/116 | HR 65 | Temp 98.7°F | Ht 63.0 in | Wt 272.0 lb

## 2022-12-05 DIAGNOSIS — I10 Essential (primary) hypertension: Secondary | ICD-10-CM

## 2022-12-05 DIAGNOSIS — G4739 Other sleep apnea: Secondary | ICD-10-CM | POA: Insufficient documentation

## 2022-12-05 DIAGNOSIS — Z719 Counseling, unspecified: Secondary | ICD-10-CM

## 2022-12-05 DIAGNOSIS — Z713 Dietary counseling and surveillance: Secondary | ICD-10-CM

## 2022-12-05 DIAGNOSIS — Z01818 Encounter for other preprocedural examination: Secondary | ICD-10-CM

## 2022-12-05 MED ORDER — AMLODIPINE BESYLATE 2.5 MG PO TABS
2.5000 mg | ORAL_TABLET | Freq: Every day | ORAL | 0 refills | Status: DC
Start: 2022-12-05 — End: 2023-03-02

## 2022-12-05 NOTE — Progress Notes (Signed)
Verbal consent has been obtained from the patient to conduct a telephone/video visit encounter to minimize exposure to COVID-19: YES    S:  Pt is preparing for bariatric sleeve surgery.  Pt presents for pre-operative review appointment and has questions about post-operative diet stages and vitamin/mineral requirements.  Pt states no hx of nutritional deficiencies.  Pt reports currently taking Rx Vitamin D as supplements at this time.  She also received 200 mg thiamin IM last week.  Pt has started liquid diet at this time.  Pt has been on the pre-op diet for 24 days.  They report no issues w with N/V/C/D at this time.  Current weight loss:  3 pounds.    O:    Today's Wt:    267 lbs    Previous Wt:    Wt Readings from Last 10 Encounters:   11/30/22 270 lb   11/16/22 259 lb   09/02/22 246 lb   08/16/22 241 lb   08/11/22 245 lb   07/06/22 242 lb   06/21/22 243 lb   06/13/22 242 lb   05/02/22 242 lb   04/13/22 236 lb 12.8 oz     Pertinent lab values:  Component      Latest Ref Rng 11/08/2022 11/09/2022   Vitamin D 25-OH Total      30 - 100 ng/mL 7 (L)     TSH, Abn Reflex to Free T4, Serum      0.35 - 4.94 uIU/mL 0.68     Vitamin A      38 - 98 mcg/dL  72    Whole Blood Vitamin B1      78 - 185 nmol/L  65 (L)       Legend:  (L) Low  A:  Per recent lab work, pt has vitamin deficiencies:  Vitamin D and thiamin.  Answered pt questions and pt verbalized understanding and desired compliance with post-operative diet.    P:  1.  Pt to start on post operative 3 week liquid diet upon discharge from hospital and continue until directed by RD/physician at first post-operative in office appointment.  2.  Pt to start the following chewable or liquid vitamins/minerals on day 15 post-operatively:   A.  Complete Adult Multivitamin and Mineral:  1 tablet BID   B.  500 mg Calcium Citrate, 1 tablet BID   C.  1,000 mcg Vitamin B12 sublingually, q day.   D.  Vitamin B 50 Complex, 1 tablet/serving q day.   E.  Iron, 29 mg q day.   F.  Vitamin  D3, 5000 IU q day.  Pt to start pre op.  3.  Pt provided with written handout of all required vitamin/mineral supplements, appropriate doses, instructions and schedule.  This handout also included a detailed review of the post operative phase w a reminder to avoid all supplementation for the first two weeks post op.  4.  Pt advised to call with any questions - contact information given.  F/u in 10-14 post operatively or PRN.    Spent a total of 15 minutes educating pt in a individual one-on-one setting.  Plan reviewed with surgeon.

## 2022-12-05 NOTE — Progress Notes (Signed)
Subjective:      Date: 12/05/2022 4:09 PM   Patient ID: Victoria Woodward is a 42 y.o. female.    Chief Complaint:  Chief Complaint   Patient presents with    Pre-op Exam       HPI:  Visit Type: Pre-operative Evaluation  Procedure:  sleeve surgery   Date of Surgery: December 07, 2022  Surgeon: dr Dianna Limbo   Fax Number (Required):   Chief Complaint: preop clearance   Recent Health (admits): no current complaints  Recent Health (denies): no current complaints  Exercise Tolerance: 4 met (i.e. climbing stairs )  Surgical Risk Factors: hypertension and sleep apnea  Prior Anesthesia: Patient reports no adverse reaction to anesthesia in the past.   Psychiatrics gave rx for gabapentin for insomnia   Problem List:  Patient Active Problem List   Diagnosis    Fall    Class 2 obesity due to excess calories without serious comorbidity with body mass index (BMI) of 38.0 to 38.9 in adult    Adjustment reaction with anxiety and depression    Closed compression fracture of body of L1 vertebra    Intractable back pain    Headache    Anxiety    Major depressive disorder    Morbid obesity with BMI of 40.0-44.9, adult    Gastroesophageal reflux disease without esophagitis    Morbid obesity with BMI of 45.0-49.9, adult    Vitamin D deficiency    Thiamine deficiency    Other sleep apnea       Current Medications:  Outpatient Medications Marked as Taking for the 12/05/22 encounter (Surgical Consult) with Nechama Guard, MD   Medication Sig Dispense Refill    ALPRAZolam (XANAX) 1 MG tablet Take 1 tablet (1 mg) by mouth 3 (three) times daily as needed      asenapine maleate (SAPHRIS) 5 MG SL Tab TAKE 1 TABLET BY MOUTH EVERY EVENING FOR SLEEP      gabapentin (NEURONTIN) 300 MG capsule Take 1 capsule (300 mg total) by mouth every 8 (eight) hours (Patient taking differently: Take 1 capsule (300 mg) by mouth nightly as needed (insomnia) 3 tablets at night) 21 capsule 0    vitamin D, ergocalciferol, (DRISDOL) 50000 UNIT Cap Take 1  capsule (50,000 Units) by mouth once a week for 4 doses 4 capsule 0       Allergies:  Allergies   Allergen Reactions    Morphine Itching       Past Medical History:  Past Medical History:   Diagnosis Date    Abnormal Pap smear of cervix     ADHD     Ankle fracture     left    Anxiety     Boxer's fracture     right    Claustrophobia     COVID-19 vaccine administered     Moderna x's 2    Depression     Eczema     Endometriosis 2006    s/p partial hysterectomy    Hx of back injury     Hypercholesterolemia     Hypertension     no meds    Insomnia     Morbid obesity with BMI of 40.0-44.9, adult        Past Surgical History:  Past Surgical History:   Procedure Laterality Date    CESAREAN SECTION  2003    EGD, BIOPSY N/A 07/06/2022    Procedure: EGD, BIOPSY;  Surgeon: Hunt Oris, MD;  Location: [facility];  Service: General;  Laterality: N/A;    ENDOMETRIOSIS SURGERY  2006    had endometrioma removed from abdominal wall with mesh placement    HYSTERECTOMY  2006    partial hysterectomy    LIFT, ARM (COSMETIC) Bilateral 2017    Breast lift lipo in arms and abdomen    VERTEBROPLASTY/KYPHOPLASTY N/A 05/08/2020    Procedure: VERTEBROPLASTY/KYPHOPLASTY;  Surgeon: Shon Millet, MD;  Location: [facility];  Service: Interventional Radiology;  Laterality: N/A;       Family History:  Family History   Problem Relation Age of Onset    Dermatomyositis Mother 84    Heart failure Mother     Diabetes Father     Heart disease Father         CHF    Hyperlipidemia Father     Dementia Father     Diabetes Sister     Heart disease Brother         VALVE disease    Colon cancer Maternal Grandmother         53    Cancer Maternal Grandmother     Cancer Paternal Aunt         unkown       Social History:  Social History     Tobacco Use    Smoking status: Former     Types: Cigars     Quit date: 2022     Years since quitting: 2.0    Smokeless tobacco: Never    Tobacco comments:     1 "black" per day   Vaping Use    Vaping Use: Former     Quit date: 05/30/2022   Substance Use Topics    Alcohol use: Not Currently     Comment: occasional, stopped drinking heavy 1-47yrs ago (daily shots 7)    Drug use: Not Currently     Types: Marijuana     Comment: quit 05/30/22        The following sections were reviewed this encounter by the provider:   Tobacco  Allergies  Meds  Problems  Med Hx  Surg Hx  Fam Hx           Vitals:  BP (!) 163/116   Pulse 65   Temp 98.7 F (37.1 C) (Temporal)   Ht 1.6 m (5\' 3" )   Wt 123.4 kg (272 lb)   SpO2 (!) 83%   BMI 48.18 kg/m   Wt Readings from Last 3 Encounters:   12/05/22 123.4 kg (272 lb)   11/30/22 122.5 kg (270 lb)   11/16/22 117.5 kg (259 lb)     Temp Readings from Last 3 Encounters:   12/05/22 98.7 F (37.1 C) (Temporal)   11/30/22 98 F (36.7 C) (Temporal)   09/02/22 97.4 F (36.3 C) (Temporal)     BP Readings from Last 3 Encounters:   12/05/22 (!) 163/116   11/30/22 129/88   09/02/22 133/84     Pulse Readings from Last 3 Encounters:   12/05/22 65   11/30/22 80   09/02/22 92       ROS:  General/Constitutional:           Denies Chills.  Denies Fatigue.  Denies Fever.       Ophthalmologic:           Denies Blurred vision.       ENT:           Denies Nasal Discharge.  Denies Sinus  pain.  Denies Sore throat.       Endocrine:           Denies Polydipsia.  Denies Polyuria.       Respiratory:           Denies Cough.  Denies Orthopnea.  Denies Shortness of breath.  Denies Wheezing.       Cardiovascular:           Denies Chest pain.  Denies Chest pain with exertion.  Denies Palpitations.  Denies Swelling in hands/feet.       Gastrointestinal:           Denies Abdominal pain.  Denies Constipation.  Denies Diarrhea.  Denies Nausea.  Denies Vomiting.       Hematology:           Denies Easy bruising.  Denies Easy Bleeding.       Genitourinary:           Denies Blood in urine.  Denies Painful urination.       Peripheral Vascular:           Denies Pain/cramping in legs after exertion.  Denies Painful extremities.        Skin:           Denies Rash.       Neurologic:           Denies Dizziness.  Denies Pre-Syncope.  Denies Tingling/Numbness.       Psychiatric:           Denies Anxiety.  Denies Depressed mood.       Objective:     Examination:   General Examination:  GENERAL APPEARANCE: alert, in no acute distress, well developed, well nourished, oriented to time, place, and person.   HEAD: normal appearance.   ORAL CAVITY: normal oropharynx.   THROAT: no erythema, no exudate.   NECK/THYROID: neck supple, carotid pulse 2+ bilaterally, no lymphadenopathy, no thyromegaly.   SKIN: no rashes.   HEART: S1, S2 normal, no murmurs, rubs, gallops, regular rate and rhythm.   LUNGS: normal effort / no distress, normal breath sounds, clear to auscultation bilaterally, no wheezes, rales, rhonchi.   ABDOMEN: bowel sounds present, no hepatosplenomegaly, soft, nontender, nondistended.   EXTREMITIES: no clubbing, cyanosis, or edema.   PERIPHERAL PULSES: 2+ dorsalis pedis, 2+ posterior tibial.   NEUROLOGIC: nonfocal, cranial nerves 2-12 grossly intact, deep tendon reflexes 2+ symmetrical, normal strength, tone and reflexes, sensory exam intact.   PSYCH: alert, oriented, cognitive function intact.    Assessment:       Plan:   Pre-Operative Evaluation:  Surgery Specific Risk:  Intermediate (carotid, intraperitoneal, intrathoracic, prostate, major abdominal, major orthopedic, major ENT)  - Electrocardiogram:   normal EKG, normal sinus rhythm, no acute ST-T changes .  - Exercise tolerance is greater than 4 mets.       She is not taking several medications in her list.  Advised pt not to take NSAIDs from 7 days prior to the surgery .   Pt has sleep apnea using cpap , advised to take cpap to hospital , advised to discuss with anesthesiologist     Pt had blood tests ordered by surgeon office .  1. Preop examination    - ECG 12 lead, NSR     2. Other sleep apnea  Using cpap     3. Primary hypertension  Blood pressure goal is <130/80 if your blood pressure  numbers are frequently over 130/80 please make a  follow  up  Appointment .More information about diet and HTN was provided .advised to decrease salt intake to less than 2 g a day .    - amLODIPine (NORVASC) 2.5 MG tablet; Take 1 tablet (2.5 mg) by mouth daily  Dispense: 90 tablet; Refill: 0  Risks and benefits of new medication were explained to patient advised to read the medication information sheet ,who verbalized understanding and agreed with the treatment plan.  Please take your blood pressure medication ASAP. Go to ER with following symptoms (chest pain ,severe shortness of breath , sudden numbness or weakness in your body ,severe palpitation,severe dizziness).      Patient's blood pressure has not been as high as above per patient , she states it has been in 120-130'/70-80' ,AMLODIPINE STARTED TODAY , THE DECISION TO HAVE THE SURGERY IS BASED ON SURGEON AND ANESTHESIOLOGIST DETERMINATION.   Patient insist to have the surgery as planned .  Pt verbalized understanding and agreed with the plan.    Nechama Guard, MD

## 2022-12-05 NOTE — Progress Notes (Signed)
2nd Visit Office Note/Bariatric Checklist sent to PCP's in basket via Epic.

## 2022-12-05 NOTE — Progress Notes (Signed)
Have you seen any specialists/other providers since your last visit with [place]?    No    Health Maintenance Due   Topic Date Due    MAMMOGRAM  Never done    Tetanus Ten-Year  05/28/2021    INFLUENZA VACCINE  06/14/2022    COVID-19 Vaccine (3 - 2023-24 season) 07/15/2022

## 2022-12-05 NOTE — PSS Phone Screening (Addendum)
Jan 22 EKG in media  Jan 22 Preop [place] in epic notes  Need sleep study. Email sent to Judson Roch (used [email or web address])

## 2022-12-06 ENCOUNTER — Encounter: Payer: Self-pay | Admitting: Surgery

## 2022-12-06 ENCOUNTER — Encounter (INDEPENDENT_AMBULATORY_CARE_PROVIDER_SITE_OTHER): Payer: Self-pay | Admitting: Internal Medicine

## 2022-12-06 NOTE — Anesthesia Preprocedure Evaluation (Addendum)
===============================================================    ADDITIONAL MEDICAL HISTORY      No LMP recorded. Patient has had a hysterectomy.    There is no height or weight on file to calculate BMI.                     Scheduled  Procedure(s) with comments:  ROBOT XI ASSISTED, LAPAROSCOPIC, SLEEVE GASTRECTOMY EGD - ASSTN=Y  **Code Ok- [place] 1/17  LAPAROSCOPIC OMENTOPEXY W/ GASTRIC RESTRICTIVE PROCEDURE  EGD, BIOPSY  Attending Physician: Hunt Oris, MD  Admission Date:(Not on file)      Assessment:      Patient Active Problem List   Diagnosis    Fall    Class 2 obesity due to excess calories without serious comorbidity with body mass index (BMI) of 38.0 to 38.9 in adult    Adjustment reaction with anxiety and depression    Closed compression fracture of body of L1 vertebra    Intractable back pain    Headache    Anxiety    Major depressive disorder    Morbid obesity with BMI of 40.0-44.9, adult    Gastroesophageal reflux disease without esophagitis    Morbid obesity with BMI of 45.0-49.9, adult    Vitamin D deficiency    Thiamine deficiency    Other sleep apnea          Past Medical History:   Diagnosis Date    Abnormal Pap smear of cervix     ADHD     Ankle fracture     left    Anxiety     Boxer's fracture     right    Claustrophobia     COVID-19 vaccine administered     Moderna x's 2    Depression     Eczema     Endometriosis 2006    s/p partial hysterectomy    Hx of back injury     Hypercholesterolemia     Hypertension     no meds    Insomnia     Morbid obesity with BMI of 40.0-44.9, adult             Past Surgical History:   Procedure Laterality Date    CESAREAN SECTION  2003    EGD, BIOPSY N/A 07/06/2022    Procedure: EGD, BIOPSY;  Surgeon: Dianna Limbo I, MD;  Location: [facility];  Service: General;  Laterality: N/A;    ENDOMETRIOSIS SURGERY  2006    had endometrioma removed from abdominal wall with mesh placement    HYSTERECTOMY  2006    partial hysterectomy    LIFT, ARM (COSMETIC) Bilateral 2017     Breast lift lipo in arms and abdomen    VERTEBROPLASTY/KYPHOPLASTY N/A 05/08/2020    Procedure: VERTEBROPLASTY/KYPHOPLASTY;  Surgeon: Boyd Kerbs, MD;  Location: [facility];  Service: Interventional Radiology;  Laterality: N/A;       Social History     Social History     Tobacco Use    Smoking status: Former     Types: Cigars     Quit date: 2022     Years since quitting: 2.0    Smokeless tobacco: Never    Tobacco comments:     1 "black" per day   Substance Use Topics    Alcohol use: Not Currently     Comment: occasional, stopped drinking heavy 1-53yrs ago (daily shots 7)      Allergies:     Allergies   Allergen Reactions  Morphine Itching     Hospital Medications:     Home Medications:     No medications prior to admission.           Vitals:   There were no vitals filed for this visit.    Labs:     Results       ** No results found for the last 24 hours. **                 Lab Results   Component Value Date    HGB 12.7 11/08/2022    PLT 251 11/08/2022       Lab Results   Component Value Date    NA 141 11/08/2022    CL 109 11/08/2022    CO2 22 11/08/2022    K 4.7 11/08/2022    CREAT 0.9 11/08/2022    BUN 19.0 11/08/2022    GLU 99 11/08/2022       Lab Results   Component Value Date    AST 26 11/08/2022    ALT 17 11/08/2022       Lab Results   Component Value Date    PT 9.8 (L) 11/08/2022    PTT 34 05/07/2020    INR 0.8 (L) 11/08/2022       No results found for: "COVID"    Rads:     Radiology Results (24 Hour)       ** No results found for the last 24 hours. **                              Relevant Problems   ANESTHESIA   (+) Other sleep apnea      PULMONARY   (+) Other sleep apnea      NEURO/PSYCH   (+) Headache      GI   (+) Gastroesophageal reflux disease without esophagitis       PSS Anesthesia Comments: BMI-48.18 with history of claustrophobia, anxiety, ADHD: Labs- WNL: EKG-SR: Medical clearance-Dr. Paula Libra.LS    Pre-evaluation Note Incomplete - DO NOT USE FOR CLINICAL DECISIONS    Anesthesia  Plan    ASA 3     general               (Per chart review)      intravenous induction   Detailed anesthesia plan: general endotracheal      Post Op:  Trial extubation is planned.                             Signed by: Ermalinda Memos, NP 12/06/22 4:29 PM

## 2022-12-06 NOTE — PSS Phone Screening (Signed)
Secure chat sent to sarah N at surgeon's office for sleep study report

## 2022-12-06 NOTE — PSS Phone Screening (Signed)
Reply back from surgeon's office received, sleep study recommended but not required for the surgery.

## 2022-12-07 ENCOUNTER — Encounter
Admission: RE | Disposition: A | Discharge status: Home / Self Care | Payer: Self-pay | Source: Home / Self Care | Attending: Surgery

## 2022-12-07 ENCOUNTER — Inpatient Hospital Stay: Payer: BLUE CROSS/BLUE SHIELD | Admitting: Anesthesiology

## 2022-12-07 ENCOUNTER — Inpatient Hospital Stay
Admission: RE | Admit: 2022-12-07 | Discharge: 2022-12-09 | DRG: 620 | Disposition: A | Discharge status: Home / Self Care | Payer: BLUE CROSS/BLUE SHIELD | Attending: Surgery | Admitting: Surgery

## 2022-12-07 DIAGNOSIS — Z79899 Other long term (current) drug therapy: Secondary | ICD-10-CM

## 2022-12-07 DIAGNOSIS — I10 Essential (primary) hypertension: Secondary | ICD-10-CM | POA: Diagnosis present

## 2022-12-07 DIAGNOSIS — Z6841 Body Mass Index (BMI) 40.0 and over, adult: Secondary | ICD-10-CM

## 2022-12-07 DIAGNOSIS — E519 Thiamine deficiency, unspecified: Secondary | ICD-10-CM | POA: Diagnosis present

## 2022-12-07 DIAGNOSIS — F40240 Claustrophobia: Secondary | ICD-10-CM | POA: Diagnosis present

## 2022-12-07 DIAGNOSIS — G4730 Sleep apnea, unspecified: Secondary | ICD-10-CM | POA: Diagnosis present

## 2022-12-07 DIAGNOSIS — Z87891 Personal history of nicotine dependence: Secondary | ICD-10-CM

## 2022-12-07 DIAGNOSIS — K219 Gastro-esophageal reflux disease without esophagitis: Secondary | ICD-10-CM | POA: Diagnosis present

## 2022-12-07 DIAGNOSIS — E7800 Pure hypercholesterolemia, unspecified: Secondary | ICD-10-CM | POA: Diagnosis present

## 2022-12-07 DIAGNOSIS — E559 Vitamin D deficiency, unspecified: Secondary | ICD-10-CM | POA: Diagnosis present

## 2022-12-07 HISTORY — PX: LAPAROSCOPIC, OMENTOPEXY: SHX00100

## 2022-12-07 HISTORY — PX: ROBOT XI ASSISTED,LAPAROSCOPIC,SLEEVE GASTRECTOMY: SHX6092

## 2022-12-07 HISTORY — PX: ESOPHAGOGASTRODUODENOSCOPY (EGD), BIOPSY: SHX3796

## 2022-12-07 SURGERY — ROBOT XI ASSISTED, LAPAROSCOPIC, SLEEVE GASTRECTOMY
Anesthesia: Anesthesia General | Site: Abdomen | Wound class: Clean Contaminated

## 2022-12-07 MED ORDER — SCOPOLAMINE 1 MG/3DAYS TD PT72
1.0000 | MEDICATED_PATCH | TRANSDERMAL | Status: DC | PRN
Start: 2022-12-07 — End: 2022-12-09

## 2022-12-07 MED ORDER — ONDANSETRON HCL 4 MG/2ML IJ SOLN
INTRAMUSCULAR | Status: DC | PRN
Start: 2022-12-07 — End: 2022-12-07
  Administered 2022-12-07: 4 mg via INTRAVENOUS

## 2022-12-07 MED ORDER — MAGNESIUM SULFATE IN D5W 1-5 GM/100ML-% IV SOLN
1.0000 g | Freq: Once | INTRAVENOUS | Status: AC
Start: 2022-12-07 — End: 2022-12-07
  Administered 2022-12-07: 1 g via INTRAVENOUS

## 2022-12-07 MED ORDER — PROPOFOL 10 MG/ML IV EMUL (WRAP)
INTRAVENOUS | Status: AC
Start: 2022-12-07 — End: ?
  Filled 2022-12-07: qty 20

## 2022-12-07 MED ORDER — LACTATED RINGERS IR SOLN
Status: AC | PRN
Start: 2022-12-07 — End: 2022-12-07
  Administered 2022-12-07: 1000 mL

## 2022-12-07 MED ORDER — SUCCINYLCHOLINE CHLORIDE 20 MG/ML IJ SOLN
INTRAMUSCULAR | Status: AC
Start: 2022-12-07 — End: ?
  Filled 2022-12-07: qty 5

## 2022-12-07 MED ORDER — FAMOTIDINE 10 MG/ML IV SOLN (WRAP)
20.0000 mg | INTRAVENOUS | Status: AC
Start: 2022-12-07 — End: 2022-12-07
  Administered 2022-12-07: 20 mg via INTRAVENOUS

## 2022-12-07 MED ORDER — LIDOCAINE HCL 1 % IJ SOLN
1.0000 mL | Freq: Once | INTRAMUSCULAR | Status: DC | PRN
Start: 2022-12-07 — End: 2022-12-07

## 2022-12-07 MED ORDER — SODIUM CHLORIDE 0.9 % IV SOLN
INTRAVENOUS | Status: DC | PRN
Start: 2022-12-07 — End: 2022-12-07
  Administered 2022-12-07: .5 ug/kg/h via INTRAVENOUS

## 2022-12-07 MED ORDER — CELECOXIB 200 MG PO CAPS
ORAL_CAPSULE | ORAL | Status: AC
Start: 2022-12-07 — End: ?
  Filled 2022-12-07: qty 2

## 2022-12-07 MED ORDER — MIDAZOLAM HCL 1 MG/ML IJ SOLN (WRAP)
INTRAMUSCULAR | Status: DC | PRN
Start: 2022-12-07 — End: 2022-12-07
  Administered 2022-12-07: 2 mg via INTRAVENOUS

## 2022-12-07 MED ORDER — ENOXAPARIN SODIUM 30 MG/0.3ML IJ SOSY
PREFILLED_SYRINGE | INTRAMUSCULAR | Status: AC
Start: 2022-12-07 — End: ?
  Filled 2022-12-07: qty 0.3

## 2022-12-07 MED ORDER — LACTATED RINGERS IV SOLN
INTRAVENOUS | Status: DC
Start: 2022-12-07 — End: 2022-12-09

## 2022-12-07 MED ORDER — PROCHLORPERAZINE EDISYLATE 10 MG/2ML IJ SOLN
5.0000 mg | Freq: Four times a day (QID) | INTRAMUSCULAR | Status: DC | PRN
Start: 2022-12-07 — End: 2022-12-09

## 2022-12-07 MED ORDER — AMLODIPINE BESYLATE 2.5 MG PO TABS
2.5000 mg | ORAL_TABLET | Freq: Every day | ORAL | Status: DC
Start: 2022-12-08 — End: 2022-12-09
  Administered 2022-12-08: 2.5 mg via ORAL
  Filled 2022-12-07 (×2): qty 1

## 2022-12-07 MED ORDER — MAGNESIUM SULFATE IN D5W 1-5 GM/100ML-% IV SOLN
INTRAVENOUS | Status: AC
Start: 2022-12-07 — End: ?
  Filled 2022-12-07: qty 100

## 2022-12-07 MED ORDER — FENTANYL CITRATE (PF) 50 MCG/ML IJ SOLN (WRAP)
INTRAMUSCULAR | Status: DC | PRN
Start: 2022-12-07 — End: 2022-12-07
  Administered 2022-12-07 (×2): 50 ug via INTRAVENOUS

## 2022-12-07 MED ORDER — GLUCOSE 40 % PO GEL (WRAP)
15.0000 g | ORAL | Status: DC | PRN
Start: 2022-12-07 — End: 2022-12-09

## 2022-12-07 MED ORDER — KETAMINE HCL 50 MG/ML IJ SOLN
INTRAMUSCULAR | Status: DC | PRN
Start: 2022-12-07 — End: 2022-12-07
  Administered 2022-12-07 (×3): 10 mg via INTRAVENOUS
  Administered 2022-12-07: 20 mg via INTRAVENOUS

## 2022-12-07 MED ORDER — ACETAMINOPHEN 10 MG/ML IV SOLN
1000.0000 mg | Freq: Four times a day (QID) | INTRAVENOUS | Status: AC
Start: 2022-12-07 — End: 2022-12-08
  Administered 2022-12-07 – 2022-12-08 (×3): 1000 mg via INTRAVENOUS
  Filled 2022-12-07 (×3): qty 100

## 2022-12-07 MED ORDER — PROPOFOL 10 MG/ML IV EMUL (WRAP)
INTRAVENOUS | Status: DC | PRN
Start: 2022-12-07 — End: 2022-12-07
  Administered 2022-12-07: 200 mg via INTRAVENOUS

## 2022-12-07 MED ORDER — HYDROMORPHONE HCL 1 MG/ML IJ SOLN
1.0000 mg | INTRAMUSCULAR | Status: DC | PRN
Start: 2022-12-07 — End: 2022-12-09
  Administered 2022-12-07 – 2022-12-08 (×4): 1 mg via INTRAVENOUS
  Filled 2022-12-07 (×4): qty 1

## 2022-12-07 MED ORDER — SUGAMMADEX SODIUM 200 MG/2ML IV SOLN
INTRAVENOUS | Status: DC | PRN
Start: 2022-12-07 — End: 2022-12-07
  Administered 2022-12-07: 300 mg via INTRAVENOUS

## 2022-12-07 MED ORDER — FENTANYL CITRATE (PF) 50 MCG/ML IJ SOLN (WRAP)
INTRAMUSCULAR | Status: AC
Start: 2022-12-07 — End: ?
  Filled 2022-12-07: qty 2

## 2022-12-07 MED ORDER — BUPIVACAINE HCL (PF) 0.5 % IJ SOLN
INTRAMUSCULAR | Status: AC
Start: 2022-12-07 — End: ?
  Filled 2022-12-07: qty 30

## 2022-12-07 MED ORDER — SUGAMMADEX SODIUM 200 MG/2ML IV SOLN
INTRAVENOUS | Status: AC
Start: 2022-12-07 — End: ?
  Filled 2022-12-07: qty 2

## 2022-12-07 MED ORDER — AMPHETAMINE-DEXTROAMPHETAMINE 5 MG PO TABS
15.0000 mg | ORAL_TABLET | Freq: Two times a day (BID) | ORAL | Status: DC
Start: 2022-12-08 — End: 2022-12-09
  Administered 2022-12-09: 15 mg via ORAL
  Filled 2022-12-07 (×2): qty 3

## 2022-12-07 MED ORDER — HYDRALAZINE HCL 20 MG/ML IJ SOLN
10.0000 mg | Freq: Four times a day (QID) | INTRAMUSCULAR | Status: DC | PRN
Start: 2022-12-07 — End: 2022-12-09

## 2022-12-07 MED ORDER — HYOSCYAMINE SULFATE 0.125 MG SL SUBL
0.1250 mg | SUBLINGUAL_TABLET | Freq: Four times a day (QID) | SUBLINGUAL | Status: DC | PRN
Start: 2022-12-07 — End: 2022-12-09
  Administered 2022-12-07 – 2022-12-09 (×5): 0.125 mg via SUBLINGUAL
  Filled 2022-12-07 (×5): qty 1

## 2022-12-07 MED ORDER — CEFTRIAXONE SODIUM 2 G IJ SOLR
INTRAMUSCULAR | Status: AC
Start: 2022-12-07 — End: ?
  Filled 2022-12-07: qty 2000

## 2022-12-07 MED ORDER — FENTANYL CITRATE (PF) 50 MCG/ML IJ SOLN (WRAP)
25.0000 ug | INTRAMUSCULAR | Status: DC | PRN
Start: 2022-12-07 — End: 2022-12-07
  Administered 2022-12-07: 25 ug via INTRAVENOUS

## 2022-12-07 MED ORDER — POTASSIUM CHLORIDE IN NACL 20-0.9 MEQ/L-% IV SOLN
INTRAVENOUS | Status: DC
Start: 2022-12-07 — End: 2022-12-09

## 2022-12-07 MED ORDER — SODIUM CHLORIDE BACTERIOSTATIC 0.9 % IJ SOLN
INTRAMUSCULAR | Status: DC | PRN
Start: 2022-12-07 — End: 2022-12-07
  Administered 2022-12-07: 10 mL

## 2022-12-07 MED ORDER — SODIUM CHLORIDE 0.9% BAG (IRRIGATION USE)
INTRAVENOUS | Status: DC | PRN
Start: 2022-12-07 — End: 2022-12-07
  Administered 2022-12-07: 1000 mL

## 2022-12-07 MED ORDER — DULOXETINE HCL 30 MG PO CPEP
30.0000 mg | ORAL_CAPSULE | Freq: Every morning | ORAL | Status: DC
Start: 2022-12-08 — End: 2022-12-09
  Administered 2022-12-08 – 2022-12-09 (×2): 30 mg via ORAL
  Filled 2022-12-07 (×2): qty 1

## 2022-12-07 MED ORDER — SODIUM CHLORIDE 0.9 % IV SOLN
INTRAVENOUS | Status: DC
Start: 2022-12-07 — End: 2022-12-09

## 2022-12-07 MED ORDER — LIDOCAINE HCL (PF) 2 % IJ SOLN
INTRAMUSCULAR | Status: AC
Start: 2022-12-07 — End: ?
  Filled 2022-12-07: qty 5

## 2022-12-07 MED ORDER — STERILE WATER FOR INJECTION IJ/IV SOLN (WRAP)
2.0000 g | Freq: Once | INTRAMUSCULAR | Status: AC
Start: 2022-12-07 — End: 2022-12-07
  Administered 2022-12-07: 2 g via INTRAVENOUS

## 2022-12-07 MED ORDER — EPHEDRINE SULFATE 50 MG/ML IJ/IV SOLN (WRAP)
Status: DC | PRN
Start: 2022-12-07 — End: 2022-12-07
  Administered 2022-12-07 (×2): 5 mg via INTRAVENOUS

## 2022-12-07 MED ORDER — ACETAMINOPHEN 10 MG/ML IV SOLN
INTRAVENOUS | Status: AC
Start: 2022-12-07 — End: ?
  Filled 2022-12-07: qty 100

## 2022-12-07 MED ORDER — BISACODYL 10 MG RE SUPP
10.0000 mg | Freq: Every day | RECTAL | Status: DC | PRN
Start: 2022-12-08 — End: 2022-12-09

## 2022-12-07 MED ORDER — ACETAMINOPHEN 160 MG/5ML PO SUSP/SOLN (WRAP)
650.0000 mg | ORAL | Status: DC
Start: 2022-12-08 — End: 2022-12-09
  Administered 2022-12-08 – 2022-12-09 (×5): 650 mg via ORAL
  Filled 2022-12-07 (×5): qty 20.3

## 2022-12-07 MED ORDER — ALBUTEROL SULFATE (2.5 MG/3ML) 0.083% IN NEBU
2.5000 mg | INHALATION_SOLUTION | Freq: Four times a day (QID) | RESPIRATORY_TRACT | Status: DC | PRN
Start: 2022-12-07 — End: 2022-12-09

## 2022-12-07 MED ORDER — STERILE WATER FOR IRRIGATION IR SOLN
Status: DC | PRN
Start: 2022-12-07 — End: 2022-12-07
  Administered 2022-12-07: 1000 mL

## 2022-12-07 MED ORDER — ROCURONIUM BROMIDE 10 MG/ML IV SOLN (WRAP)
INTRAVENOUS | Status: DC | PRN
Start: 2022-12-07 — End: 2022-12-07
  Administered 2022-12-07: 5 mg via INTRAVENOUS
  Administered 2022-12-07: 45 mg via INTRAVENOUS
  Administered 2022-12-07: 10 mg via INTRAVENOUS

## 2022-12-07 MED ORDER — BUPIVACAINE HCL (PF) 0.5 % IJ SOLN
INTRAMUSCULAR | Status: DC | PRN
Start: 2022-12-07 — End: 2022-12-07
  Administered 2022-12-07: 30 mL

## 2022-12-07 MED ORDER — DEXTROSE 50 % IV SOLN
12.5000 g | INTRAVENOUS | Status: DC | PRN
Start: 2022-12-07 — End: 2022-12-09

## 2022-12-07 MED ORDER — GABAPENTIN 300 MG PO CAPS
300.0000 mg | ORAL_CAPSULE | Freq: Three times a day (TID) | ORAL | Status: DC
Start: 2022-12-07 — End: 2022-12-09
  Administered 2022-12-08 – 2022-12-09 (×4): 300 mg via ORAL
  Filled 2022-12-07 (×6): qty 1

## 2022-12-07 MED ORDER — ONDANSETRON HCL 4 MG/2ML IJ SOLN
4.0000 mg | Freq: Four times a day (QID) | INTRAMUSCULAR | Status: DC | PRN
Start: 2022-12-07 — End: 2022-12-09

## 2022-12-07 MED ORDER — CELECOXIB 200 MG PO CAPS
400.0000 mg | ORAL_CAPSULE | Freq: Once | ORAL | Status: AC
Start: 2022-12-07 — End: 2022-12-07
  Administered 2022-12-07: 400 mg via ORAL

## 2022-12-07 MED ORDER — SODIUM CHLORIDE (PF) 0.9 % IJ SOLN
INTRAMUSCULAR | Status: AC
Start: 2022-12-07 — End: ?
  Filled 2022-12-07: qty 10

## 2022-12-07 MED ORDER — SUCCINYLCHOLINE CHLORIDE 20 MG/ML IJ SOLN
INTRAMUSCULAR | Status: DC | PRN
Start: 2022-12-07 — End: 2022-12-07
  Administered 2022-12-07: 180 mg via INTRAVENOUS

## 2022-12-07 MED ORDER — ENOXAPARIN SODIUM 40 MG/0.4ML IJ SOSY
40.0000 mg | PREFILLED_SYRINGE | Freq: Two times a day (BID) | INTRAMUSCULAR | Status: DC
Start: 2022-12-08 — End: 2022-12-09
  Administered 2022-12-08 – 2022-12-09 (×3): 40 mg via SUBCUTANEOUS
  Filled 2022-12-07 (×3): qty 0.4

## 2022-12-07 MED ORDER — GLYCOPYRROLATE 1 MG/5ML IJ SOLN
INTRAMUSCULAR | Status: AC
Start: 2022-12-07 — End: ?
  Filled 2022-12-07: qty 5

## 2022-12-07 MED ORDER — DEXMEDETOMIDINE HCL IN NACL 200 MCG/50ML IV SOLN
INTRAVENOUS | Status: AC
Start: 2022-12-07 — End: ?
  Filled 2022-12-07: qty 50

## 2022-12-07 MED ORDER — GABAPENTIN 300 MG PO CAPS
ORAL_CAPSULE | ORAL | Status: AC
Start: 2022-12-07 — End: ?
  Filled 2022-12-07: qty 1

## 2022-12-07 MED ORDER — PHENYLEPHRINE 100 MCG/ML IV BOLUS (ANESTHESIA)
PREFILLED_SYRINGE | INTRAVENOUS | Status: DC | PRN
Start: 2022-12-07 — End: 2022-12-07
  Administered 2022-12-07 (×3): 100 ug via INTRAVENOUS
  Administered 2022-12-07: 200 ug via INTRAVENOUS
  Administered 2022-12-07: 100 ug via INTRAVENOUS

## 2022-12-07 MED ORDER — SIMETHICONE 80 MG PO CHEW
80.0000 mg | CHEWABLE_TABLET | Freq: Four times a day (QID) | ORAL | Status: DC | PRN
Start: 2022-12-07 — End: 2022-12-09
  Administered 2022-12-08: 80 mg via ORAL
  Filled 2022-12-07: qty 1

## 2022-12-07 MED ORDER — LIDOCAINE 4 % EX CREA
TOPICAL_CREAM | Freq: Once | CUTANEOUS | Status: DC | PRN
Start: 2022-12-07 — End: 2022-12-07

## 2022-12-07 MED ORDER — PHENYLEPHRINE 100 MCG/ML IV SYRINGE FOR INFUSION (ANESTHESIA)
PREFILLED_SYRINGE | INTRAVENOUS | Status: DC | PRN
Start: 2022-12-07 — End: 2022-12-07
  Administered 2022-12-07: 35 ug/min via INTRAVENOUS

## 2022-12-07 MED ORDER — PROMETHAZINE HCL 25 MG/ML IJ SOLN
INTRAMUSCULAR | Status: DC | PRN
Start: 2022-12-07 — End: 2022-12-07
  Administered 2022-12-07: 6.25 mg via INTRAVENOUS

## 2022-12-07 MED ORDER — GLYCOPYRROLATE 0.2 MG/ML IJ SOLN (WRAP)
INTRAMUSCULAR | Status: DC | PRN
Start: 2022-12-07 — End: 2022-12-07
  Administered 2022-12-07: .2 mg via INTRAVENOUS

## 2022-12-07 MED ORDER — GLUCAGON 1 MG IJ SOLR (WRAP)
1.0000 mg | INTRAMUSCULAR | Status: DC | PRN
Start: 2022-12-07 — End: 2022-12-09

## 2022-12-07 MED ORDER — GABAPENTIN 300 MG PO CAPS
300.0000 mg | ORAL_CAPSULE | Freq: Once | ORAL | Status: AC
Start: 2022-12-07 — End: 2022-12-07
  Administered 2022-12-07: 300 mg via ORAL

## 2022-12-07 MED ORDER — ROCURONIUM BROMIDE 50 MG/5ML IV SOLN
INTRAVENOUS | Status: AC
Start: 2022-12-07 — End: ?
  Filled 2022-12-07: qty 10

## 2022-12-07 MED ORDER — FENTANYL CITRATE (PF) 50 MCG/ML IJ SOLN (WRAP)
INTRAMUSCULAR | Status: AC
Start: 2022-12-07 — End: 2022-12-08
  Filled 2022-12-07: qty 2

## 2022-12-07 MED ORDER — KETAMINE HCL 50 MG/ML IJ/IV SOLN (WRAP)
Status: AC
Start: 2022-12-07 — End: ?
  Filled 2022-12-07: qty 1

## 2022-12-07 MED ORDER — EPHEDRINE SULFATE 50 MG/ML IJ/IV SOLN (WRAP)
Status: AC
Start: 2022-12-07 — End: ?
  Filled 2022-12-07: qty 1

## 2022-12-07 MED ORDER — BUPIVACAINE LIPOSOME 1.3 % IJ SUSP
INTRAMUSCULAR | Status: AC
Start: 2022-12-07 — End: ?
  Filled 2022-12-07: qty 20

## 2022-12-07 MED ORDER — ALPRAZOLAM 0.25 MG PO TABS
1.0000 mg | ORAL_TABLET | Freq: Three times a day (TID) | ORAL | Status: DC | PRN
Start: 2022-12-07 — End: 2022-12-09
  Administered 2022-12-08 (×2): 1 mg via ORAL
  Filled 2022-12-07 (×2): qty 4

## 2022-12-07 MED ORDER — BUPIVACAINE LIPOSOME 1.3 % IJ SUSP
INTRAMUSCULAR | Status: DC | PRN
Start: 2022-12-07 — End: 2022-12-07
  Administered 2022-12-07: 20 mL

## 2022-12-07 MED ORDER — ENOXAPARIN SODIUM 30 MG/0.3ML IJ SOSY
30.0000 mg | PREFILLED_SYRINGE | Freq: Once | INTRAMUSCULAR | Status: AC
Start: 2022-12-07 — End: 2022-12-07
  Administered 2022-12-07: 30 mg via SUBCUTANEOUS

## 2022-12-07 MED ORDER — DEXTROSE 10 % IV BOLUS
12.5000 g | INTRAVENOUS | Status: DC | PRN
Start: 2022-12-07 — End: 2022-12-09

## 2022-12-07 MED ORDER — FAMOTIDINE 10 MG/ML IV SOLN (WRAP)
INTRAVENOUS | Status: AC
Start: 2022-12-07 — End: ?
  Filled 2022-12-07: qty 2

## 2022-12-07 MED ORDER — DIPHENHYDRAMINE HCL 50 MG/ML IJ SOLN
12.5000 mg | Freq: Four times a day (QID) | INTRAMUSCULAR | Status: DC | PRN
Start: 2022-12-07 — End: 2022-12-09

## 2022-12-07 MED ORDER — MIDAZOLAM HCL 1 MG/ML IJ SOLN (WRAP)
INTRAMUSCULAR | Status: AC
Start: 2022-12-07 — End: ?
  Filled 2022-12-07: qty 2

## 2022-12-07 MED ORDER — LIDOCAINE HCL 2 % IJ SOLN
INTRAMUSCULAR | Status: DC | PRN
Start: 2022-12-07 — End: 2022-12-07
  Administered 2022-12-07: 100 mg

## 2022-12-07 MED ORDER — OXYCODONE HCL 5 MG/5ML PO SOLN
5.0000 mg | ORAL | Status: DC | PRN
Start: 2022-12-07 — End: 2022-12-08
  Administered 2022-12-07 – 2022-12-08 (×3): 5 mg via ORAL
  Filled 2022-12-07 (×3): qty 5

## 2022-12-07 MED ORDER — PHENYLEPHRINE 100 MCG/ML IV SOSY (WRAP)
PREFILLED_SYRINGE | INTRAVENOUS | Status: AC
Start: 2022-12-07 — End: ?
  Filled 2022-12-07: qty 10

## 2022-12-07 MED ORDER — FAMOTIDINE 10 MG/ML IV SOLN (WRAP)
20.0000 mg | Freq: Two times a day (BID) | INTRAVENOUS | Status: DC
Start: 2022-12-07 — End: 2022-12-09
  Administered 2022-12-07 – 2022-12-09 (×4): 20 mg via INTRAVENOUS
  Filled 2022-12-07 (×4): qty 2

## 2022-12-07 MED ORDER — ONDANSETRON HCL 4 MG/2ML IJ SOLN
4.0000 mg | Freq: Once | INTRAMUSCULAR | Status: DC | PRN
Start: 2022-12-07 — End: 2022-12-07

## 2022-12-07 MED ORDER — ONDANSETRON HCL 4 MG/2ML IJ SOLN
INTRAMUSCULAR | Status: AC
Start: 2022-12-07 — End: ?
  Filled 2022-12-07: qty 2

## 2022-12-07 SURGICAL SUPPLY — 118 items
ADHESIVE SKIN CLOSURE DERMABOND ADVANCED (Skin Closure) ×1
ADHESIVE SKIN CLOSURE DERMABOND ADVANCED .7 ML LIQUID APPLICATOR (Skin Closure) ×1 IMPLANT
ADHESIVE SKNCLS 2 OCTYL CYNCRLT .7ML (Skin Closure) ×1
APPLICATOR CHLORAPREP 26 ML 70% ISOPROPYL ALCOHOL 2% CHLORHEXIDINE (Applicator) ×2 IMPLANT
APPLICATOR PRP 70% ISPRP 2% CHG 26ML (Applicator) ×2
BLADE SRGCLPR LF STRL PVT ADJ HD DISP (Blade)
BLADE SURGICAL CLIPPER PIVOT ADJUSTABLE (Blade)
BLADE SURGICAL CLIPPER PIVOT ADJUSTABLE HEAD 9661 PURPLE (Blade) IMPLANT
CLIP SUT PDS LPR TY VCL LF STRL ABS (Suture) ×1
CLIP SUTURE ABSORBABLE LAPRA-TY VICRYL (Suture) ×1
CLIP SUTURE ABSORBABLE LAPRA-TY VICRYL POLYDIOXANONE (Suture) ×1 IMPLANT
COVER FLEXIBLE LIGHT HANDLE PLASTIC GREEN (Procedure Accessories) ×1 IMPLANT
COVER FLEXIBLE MEDLINE LIGHT HANDLE (Procedure Accessories) ×1
COVER LGHT HNDL PLS LF STRL FLXB DISP (Procedure Accessories) ×1
COVER STAND L57 IN X W30 IN XL MEDLINE (Drape) ×1
COVER STAND L57 IN X W30 IN XL MEDLINE SMS (Drape) ×1 IMPLANT
COVER STND SMS XL PRXM MAYO 57X30IN LF (Drape) ×1
DEVICE CLSR 2-0 V-20 TPR PNT V-LOC 6IN (Suture)
DRAPE COLUMN EQUIPMENT DA VINCI XI (Drape) ×1 IMPLANT
DRAPE EQP DVNC XI 21X19X10.5IN ARM 21LB (Drape) ×4
DRAPE EQP DVNC XI CLMN (Drape) ×1
DRAPE EQUIPMENT ARM L21 IN X W19 IN X H10.5 IN DA VINCI XI 21 LB (Drape) ×4 IMPLANT
DRAPE SRG SMS .75 PRXM 77X53IN LF STRL (Drape) ×1
DRAPE SURGICAL SHEET L77 IN X W53 IN (Drape) ×1
DRAPE SURGICAL SHEET L77 IN X W53 IN MEDLINE SMS 3/4 BLUE (Drape) ×1 IMPLANT
ELECTRODE ADULT PATIENT RETURN L9 FT REM POLYHESIVE ACRYLIC FOAM (Procedure Accessories) ×1 IMPLANT
ELECTRODE PATIENT RETURN L9 FT VALLEYLAB (Procedure Accessories) ×1
ELECTRODE PT RTN RM PHSV ACRL 9FT (Procedure Accessories) ×1
GLOVE SRG PLISPRN 7.5 BGL PI LF STRL PF (Glove) ×2
GLOVE SRG PLISPRN 7.5 BGL PI ORTHOPRO (Glove) ×1
GLOVE SRG PLISPRN 8 BGL PI INDCTR (Glove) ×2
GLOVE SURGICAL 7 1/2 BIOGEL PI ORTHOPRO (Glove) ×1
GLOVE SURGICAL 7 1/2 BIOGEL PI ORTHOPRO POWDER FREE MICRO ROUGH (Glove) ×1 IMPLANT
GLOVE SURGICAL 7.5 BIOGEL PI POWDER FREE (Glove) ×2
GLOVE SURGICAL 7.5 BIOGEL PI POWDER FREE MICRO ROUGHENED BEAD CUFF (Glove) ×2 IMPLANT
GLOVE SURGICAL 8 BIOGEL PI INDICATOR (Glove) ×2
GLOVE SURGICAL 8 BIOGEL PI INDICATOR UNDERGLOVE POWDER FREE SMOOTH (Glove) ×2 IMPLANT
GOWN SRG XL ORBS LF STRL AAMI LVL 4 (Gown) ×2
GOWN SURGICAL XL BLUE AAMI LEVEL 4 BREATHABLE CSR WRAP PROTECTION (Gown) ×2 IMPLANT
GOWN SURGICAL XL MEDLINE BLUE AAMI LEVEL (Gown) ×2
HANDLE LGHT ASPN LF STRL SNPON (Procedure Accessories) ×1
HANDLE LIGHT SNAP ON ASPEN (Procedure Accessories) ×1 IMPLANT
IRRIGATOR SUCTION ERGONOMIC HAND PIECE STRYKEFLOW II (Suction) ×1 IMPLANT
IRRIGATOR SUCTION STRYKEFLOW 2 (Suction) ×1
KIT INFECTION CONTROL CUSTOM (Kits) ×1
KIT INFECTION CONTROL CUSTOM IFOH03 (Kits) ×1 IMPLANT
MAT PATIENT L81 IN X W39 IN M2 (Positioning Supplies) ×1
MAT PATIENT L81 IN X W39 IN M2 MICROCLIMATE BODY PAD PREVALON MOBILE (Positioning Supplies) ×1 IMPLANT
MAT PT PREVALON 81X39IN M2 MICROCLIMATE (Positioning Supplies) ×1
NEEDLE SPINAL L3 1/2 IN REGULAR WALL QUINCKE TIP OD20 GA BD (Needles) ×1 IMPLANT
NEEDLE SPNL PP RW BD QNCK 20GA 3.5IN LF (Needles) ×1
OBTURATOR BLDLS 8MM LONG (Instrument) ×1
PACK SRG LF STRL GN LAP SHR DISP (Tray) ×1
PACK SURGICAL GENERAL LAP SHARE (Tray) ×1
PACK SURGICAL GENERAL LAP SHARE MEDLINE INDUSTRIES, INC (Tray) ×1 IMPLANT
PASSER SUT WECK EFX CLS (Procedure Accessories) ×1
REDUCER CANNULA ROBOTIC STANDARD DA VINCI XI ENDOWRIST 12-8MM 470381 (Instrument) ×1 IMPLANT
REDUCER DAVINCI ENDOWRST12-8MM (Instrument) ×1
RELOAD STAPLER SUREFORM 60 DISPOSABLE BLUE (Staplers) ×5 IMPLANT
RELOAD STAPLER SUREFORM 60 GREEN (Staplers) ×1 IMPLANT
RELOAD STPLR SUREFORM 60 DISP BLU (Staplers) ×5
RELOAD STPLR SUREFORM 60 GRN (Staplers) ×1
SEAL CANNULA ROBOTIC UNIVERSAL DA VINCI XI ENDOWRIST 5-10MM 478161 (Procedure Accessories) IMPLANT
SEAL CNN EWRST 12MM STPLR DVNC XI (Procedure Accessories) ×1 IMPLANT
SEAL DAVINCI UNIVERSAL 5-10MM (Procedure Accessories)
SEAL DAVINCI UNIVERSAL 5-8MM (Procedure Accessories) ×3 IMPLANT
SEALER TISS DVNC SLIM JAW XTD DISP (Instrument) ×1
SEALER TISSUE DA VINCI SLIM JAW EXTEND DISPOSABLE (Instrument) ×1 IMPLANT
SEALER/DIVIDER LAPAROSCOPIC L44 CM 350 D (Laparoscopy Supplies)
SEALER/DIVIDER LAPAROSCOPIC L44 CM 350 D L18.5 MM MARYLAND CURVE JAW (Laparoscopy Supplies) IMPLANT
SEALER/DIVIDER LAPSCP 350D 18.5MM LGSR (Laparoscopy Supplies)
SET HIGH FLOW SMOKE EVACUATION (Tubing) ×1
SET HIGH FLOW SMOKE EVACUATION PNEUMOCLEAR TUBING (Tubing) ×1 IMPLANT
SET TUBING HBRD EGA-500 OLMPS (Tubing) ×1
SET TUBING HYBRID EGA-500 OLYMPUS (Tubing) ×1 IMPLANT
SET TUBING PNEUMOCLEAR HFLO SMK EVAC (Tubing) ×1
SOLUTION IV LACTATED RINGERS 1000 ML (IV Solutions) ×1
SOLUTION IV LACTATED RINGERS 1000 ML PLASTIC CONTAINER (IV Solutions) ×1 IMPLANT
SOLUTION IV LR 1000ML VFLX LF PLS CNTNR (IV Solutions) ×1
SPONGE LAP CTTN 18X18IN LF STRL 4 PLY (Sponge) ×1
SPONGE LAPAROTOMY L18 IN X W18 IN 4 PLY (Sponge) ×1
SPONGE LAPAROTOMY L18 IN X W18 IN 4 PLY RADIOPAQUE PYRONEMA FREE HIGH (Sponge) ×1 IMPLANT
STAPLER 60 INTUITIVE DA VINCI XI (Sealant) ×3 IMPLANT
STAPLER INTERNAL LINEAR CUTTER RELOADABLE DISP SUREFORM 60MM TITANIUM (Staplers) ×1 IMPLANT
STAPLER INTNL DVNC XI INTUITIVE BLK GRN (Sealant) ×3 IMPLANT
STAPLER OBTURATOR ROBOTIC BLADELESS LONG DA VINCI XI ENDOWRIST 8MM (Instrument) ×1 IMPLANT
STAPLER SUREFORM 60 SEAMGUARD BIOABSORBABLE GREEN/BLUE (Sealant) ×3 IMPLANT
STAPLER SUREFORM 60 SPU (Staplers) ×1
SUTURE ABS 0 VCL 54IN BRD TIE COAT VIOL (Suture) ×1
SUTURE ABS 2-0 SH VCL 27IN BRD COAT VIOL (Suture) ×1
SUTURE ABS 4-0 PS2 MNCRL MTPS 27IN MFL (Suture) ×2
SUTURE COATED VICRYL 0 L54 IN BRAID TIES (Suture) ×1 IMPLANT
SUTURE COATED VICRYL 2-0 SH L27 IN BRAID (Suture) ×1
SUTURE COATED VICRYL 2-0 SH L27 IN BRAID COATED VIOLET ABSORBABLE (Suture) ×1 IMPLANT
SUTURE ETHIBOND EXCEL GREEN 2-0 SH L30 (Suture)
SUTURE ETHIBOND EXCEL GREEN 2-0 SH L30 IN BRAID NONABSORBABLE (Suture) IMPLANT
SUTURE MONOCRYL 4-0 PS-2 L27 IN (Suture) ×2
SUTURE MONOCRYL 4-0 PS-2 L27 IN MONOFILAMENT UNDYED ABSORBABLE (Suture) ×2 IMPLANT
SUTURE NABSB 2-0 SH EBND EXC 30IN BRD (Suture)
SUTURE PASSER WECK EFX CLASSIC (Procedure Accessories) ×1 IMPLANT
SUTURE V-LOC 2-0 V-20 1/2 CIRCLE L6 IN NONABS BLUE (Suture) IMPLANT
SUTURE V-LOC BLUE 2-0 V-20 TAPER POINT (Suture)
SYRINGE 30 ML CONCENTRIC TIP GRADUATE (Syringes, Needles) ×2
SYRINGE 30 ML CONCENTRIC TIP GRADUATE NONPYROGENIC DEHP FREE LOK (Syringes, Needles) ×2 IMPLANT
SYRINGE MED 30ML LL LF STRL CONC TIP (Syringes, Needles) ×2
SYSTEM GASTRIC LAVAGE SELECTION VALVE FENESTRATION PATTERN 107CM 36FR (Procedure Accessories) ×1 IMPLANT
SYSTEM GSTRC LAV THRMPLST ELSTMR VISIGI (Procedure Accessories) ×1
SYSTEM IMAGING 8X6IN CLEARIFY MICROFIBER WARM HUB TRCR WIPE DSPSBL (Kits) ×1 IMPLANT
SYSTEM IMG MRFBR CLEARIFY 8X6IN WRM HUB (Kits) ×1
TIP SCISSORS ENDOCUT L19.3 MM (Instrument) IMPLANT
TIP SCSR ENDCT 19.3MM DISP (Instrument)
TROCAR LAPAROSCOPIC STABILITY SLVE (Laparoscopy Supplies) ×1
TROCAR LAPSCP EPTH XCL 5MM 100MM LF STRL (Laparoscopy Supplies) ×1
TROCAR OD5 MM L100 MM BLADELESS STABLE SLEEVE ENDOPATH XCEL ENDOSCOPIC (Laparoscopy Supplies) ×1 IMPLANT
TUBING SCT IRR (Suction) ×1
WATER STERILE PLASTIC POUR BOTTLE 1000 (Irrigation Solutions) ×1
WATER STERILE PLASTIC POUR BOTTLE 1000 ML (Irrigation Solutions) ×1 IMPLANT
WATER STRL 1000ML LF PLS PR BTL (Irrigation Solutions) ×1

## 2022-12-07 NOTE — Plan of Care (Signed)
Problem: Pain interferes with ability to perform ADL  Goal: Pain at adequate level as identified by patient  Outcome: Progressing  Flowsheets (Taken 12/07/2022 1729)  Pain at adequate level as identified by patient:   Identify patient comfort function goal   Assess for risk of opioid induced respiratory depression, including snoring/sleep apnea. Alert healthcare team of risk factors identified.   Assess pain on admission, during daily assessment and/or before any "as needed" intervention(s)   Reassess pain within 30-60 minutes of any procedure/intervention, per Pain Assessment, Intervention, Reassessment (AIR) Cycle   Evaluate if patient comfort function goal is met   Offer non-pharmacological pain management interventions   Include patient/patient care companion in decisions related to pain management as needed     Problem: Side Effects from Pain Analgesia  Goal: Patient will experience minimal side effects of analgesic therapy  Outcome: Progressing  Flowsheets (Taken 12/07/2022 1729)  Patient will experience minimal side effects of analgesic therapy:   Monitor/assess patient's respiratory status (RR depth, effort, breath sounds)   Assess for changes in cognitive function   Prevent/manage side effects per LIP orders (i.e. nausea, vomiting, pruritus, constipation, urinary retention, etc.)     Problem: Day of Surgery- Sleeve Gastrectomy Surgery  Goal: Stable vital signs and fluid balance  Outcome: Progressing  Flowsheets (Taken 12/07/2022 1729)  Stable vital signs and fluid balance:   Monitor vital signs   Monitor/assess O2 saturation   Monitor intake and output. Notify LIP if urine output is less than 240 mL in 8 hours   Monitor lab values. Notify LIP of abnormal results.  Goal: Pain at adequate level  Outcome: Progressing  Flowsheets (Taken 12/07/2022 1729)  Pain at adequate level as identified by patient:   Identify patient comfort function goal   Evaluate if patient comfort function goal is met   Initiate and  reinforce use of PCA, if ordered  Goal: Effective ventilation maintained  Outcome: Progressing  Flowsheets (Taken 12/07/2022 1729)  Effective ventilation maintained:   Monitor/assess patient's respiratory status (RR, depth, effort, breath sounds)   Monitor/assess O2 saturation   Administer O2 therapy as ordered   Teach/reinforce use of incentive spirometer 10 times per hour while awake, cough and deep breath as needed  Goal: Tissue perfusion is adequate  Outcome: Progressing  Flowsheets (Taken 12/07/2022 1729)  Tissue perfusion is adequate:   Assess and monitor skin color and temperature   Teach/review/reinforce ankle pump exercises  Goal: Nutritional intake is adequate  Outcome: Progressing  Flowsheets (Taken 12/07/2022 1729)  Nutritional intake is adequate:   Monitor intake and output   Assess GI status (bowel sounds, nausea/vomiting, distention, flatus)   Administer acid-reducer meds as prescribed   Provide sips of water. Do not use straws.  Goal: Mobility/activity is maintained at optimum level  Outcome: Progressing  Flowsheets (Taken 12/07/2022 1729)  Mobility/activity is maintained at optimum level:   Out of bed with assistance as needed   Ambulate at least once per shift with assistance  Goal: Patient/patient care companion demonstrates understanding of surgery/treatment plan, medications, and discharge plans  Outcome: Progressing  Flowsheets (Taken 12/07/2022 1729)  Patient/patient care companion demonstrates understanding of surgery/treatment plan, medications, and discharge:   Initiate/review discharge plans with patient/patient care companion   Provide/review education on patient's medications, medication side effects, and post-operative management to patient/patient care companion

## 2022-12-07 NOTE — Nursing Progress Note (Signed)
Patient c/o headache immediately after Pepcid administration.  Anesthesia (CRNA) at bedside.

## 2022-12-07 NOTE — Transfer of Care (Signed)
Anesthesia Transfer of Care Note    Patient: Victoria Woodward    Procedures performed: Procedure(s):  ROBOT XI ASSISTED, LAPAROSCOPIC, SLEEVE GASTRECTOMY EGD  LAPAROSCOPIC OMENTOPEXY W/ GASTRIC RESTRICTIVE PROCEDURE  EGD, BIOPSY    Anesthesia type: General ETT    Patient location:Phase I PACU    Last vitals:   Vitals:    12/07/22 1512   BP: 125/70   Pulse: 77   Resp:    Temp:    SpO2: 98%       Post pain: Patient not complaining of pain, continue current therapy      Mental Status:alert     Respiratory Function: tolerating face mask    Cardiovascular: stable    Nausea/Vomiting: patient not complaining of nausea or vomiting    Hydration Status: adequate    Post assessment: no apparent anesthetic complications  P/t VSS, spont breathing, taken to phase 1 pacu on 8 L FM, attached to monitors and o2 in pacu, report given to pacu RN, VSS and spont breathing in pacu.    Signed by: Jocelyn Lamer, CRNA  12/07/22 3:13 PM

## 2022-12-07 NOTE — Op Note (Signed)
FULL OPERATIVE NOTE    Date Time: 12/07/22 2:53 PM  Patient Name: Victoria Woodward, Victoria Woodward (MRN: [id number])  Attending Physician: Tommye Standard, MD      Date of Operation:   12/07/2022    Providers Performing:   Surgeon(s) and Role:     * Tommye Standard, MD - Primary     * Mayer Camel, DO - Fellow observation    Surgical First Assistant(s):   First Assistant: Willette Brace who was essential and present throughout the procedure to help in   positioning, transfer, port placement, skin closure, retraction and   exposure during critical parts of the procedure, also docking the robot.  No   Qualifying resident was available.         Operative Procedure:   ROBOT XI ASSISTED, LAPAROSCOPIC, SLEEVE GASTRECTOMY EGD: 54098 (CPT)  LAPAROSCOPIC OMENTOPEXY W/ GASTRIC RESTRICTIVE PROCEDURE: 49329 (CPT)  EGD, BIOPSY: 43239 (CPT)    Preoperative Diagnosis:   Pre-Op Diagnosis Codes:     * Morbid obesity with BMI of 40.0-44.9, adult [E66.01, Z68.41]  BMI: 46 GERD, HTN, hypercholesterolemia  Postoperative Diagnosis:   Post-Op Diagnosis Codes:     * Morbid obesity with BMI of 40.0-44.9, adult [E66.01, Z68.41]    Anesthesia:   General    Estimated blood loss: minimal    Complications:none    Anaesthesia: General Endotracheal Intubation    Findings: no hiatal hernia, normal appearing liver    Specimen: portion of stomach    Drain:  None    Implant:   Implant Name Type Inv. Item Serial No. Manufacturer Lot No. LRB No. Used Action   STAPLER INTNL DVNC XI INTUITIVE BLK GRN - [id number] Sealant STAPLER INTNL DVNC XI INTUITIVE BLK GRN  Dewitt Hoes [id number] N/A 3 Implanted               INDICATIONS:  Victoria Woodward is a 42 y.o. morbidly obese female who has failed multiple previous attempts at conservative weight loss who opted to proceed with a Da Vinci assisted laparoscopic sleeve gastrectomy as a surgical weight loss option understanding all other options including other surgical and medical weight loss options.  After an  extensive preoperative education workup, psychiatric, exercise, and dietary counseling, meeting all the preoperative clearance criteria and being cleared by medicine was cleared and scheduled for surgery. For a detailed explanation of risks please refer to the hospital and/or office chart.     DESCRIPTION OF PROCEDURE:  Victoria Woodward was admitted through the preoperative holding area, received preoperative antibiotics, subcutaneous Lovenox, and compression stockings were placed.  Patient was identified by myself in the preoperative area, where surgical consent was once again obtained and all further questions and concerns were addressed; was then taken to operating room and was placed in supine position.  General endotracheal anesthesia was instituted. Footboard was placed and pressure points were padded.  The abdomen was prepped in the usual sterile fashion.  Surgical pause was made and an incision was made just to the left above the umbilicus through with a 5-mm Optiview blunt-tipped trocar was placed under direct vision and laparoscope into the abdominal cavity using the Optiview technique.  There were no intra-abdominal injuries.   2 more 8-mm trocars were placed in the left mid quadrant and another in the left flank.  A 12 mm Da Vinci trocar was placed in the right lateral mid quadrant.  The periumbilical 5mm port was removed and upsized to the 8mm Da Vinci port.  Next, bilateral TAPP  block was performed using 60cc of mixture of 0.25% Marcaine and Exparel.  The patient was then placed in reverse Trendelenburg position.  A Nathanson liver retractor was placed to retract left lobe of liver anteriorly.      Next, the Da Vinci robot was appropriately docked.  Graspers were placed under direct vision.  The stomach was well decompressed using the 53F VisiG.  I then went to the console.  No hiatal hernia was identified.   Angle of His was exposed bluntly down to the left crus and the fat pad overlying GE  junction was divided. The greater curvature of the stomach was marked about 4-5 cm proximal to the pylorus. The greater curvature of the stomach was then mobilized starting at about 4-5 cm proximal to the pylorus to divide the gastrocolic ligament using the Vessel sealer device.  The dissection then continued proximally to divide the rest of the gastrocolic ligament, followed by gastrosplenic ligament, followed by the short gastric vessels to completely mobilize the fundus of the stomach and expose the angle of His and also the base of the left crus of the diaphragm.   The stomach was then divided starting at about 4-5 cm proximal to pylorus, pointing toward and lateral to the angularis.  The #51mm Green endoGIA stapler with a SeamGuard was fired wide at the angularis to minimize risk of stricture formation at that site.  The VisiG was carefully advanced into the distal aspect of the resection into the sleeve  towards the pylorus without distorting the proximal sleeve.  This was followed by a #80mm Blue load with a SeamGuard and an additional series of #21mm Blue load linear staplers with a SeamGuard to get around the angularis and point towards the angle of His to fully construct the sleeve.  The last or most proximal stapler was fired just 0.5 cm or so lateral to the gastroesophageal junction using the 70mm Blue load with a SeamGuard to fully transect the stomach.  The staple line was well visualized and intact.    Next, using a running 2-0 Vicryl, an omental patch was used to oversew the staple lines starting proximally at the Angle of His to avoid slippage of the sleeve.  The VisiG was removed.   An EGD scope was introduced into the proximal esophagus.  This was advanced under direct vision into the proximal sleeve.  The sleeve was distended with air and advanced to the distal sleeve.  The entire sleeve staple line was submerged under a pool of saline solution.  After adequate distention of the sleeve was  ensured under a pool of  saline solution no extravasation of air or bubbles was noted through the sleeve staple line.  This test was repeated and each time adequate distention of the sleeve was ensured.  The staple line was evaluated, there was good formation, with good hemostasis, and good perfusion.   Hemostasis was ensured.  The left upper quadrant was irrigated with saline solution.  The irrigant was aspirated.  The robot was docked off the patient.      The resected stomach was retrieved through the 12 mm trocar site.  It was passed off the table for permanent  pathology.  The fascial opening at the 12 mm trocar site was approximated using  figure-of-eight #0 Vicryl suture using the Carter-Thomason fascial closure device.      The trocars were all removed  under direct vision.  No bleeding was noted from any of the trocar sites.  Skin incisions were approximated #4-0 Monocryl suture.  Dermabond was placed over the incision sites.  The patient was then extubated  and taken to the post anesthesia care unit without any complications,  tolerating the procedure well.      Signed by: Hunt Oris, MD, MD    [facility]

## 2022-12-07 NOTE — Progress Notes (Addendum)
Admission Note-  Patient admitted to Surgical unit at 1720. Hand off report received from PACU.    Patient oriented to room, bed in lowest position, bed alarm activated, call bell within reach.   Fall prevention protocols reviewed with patient and visitor.  Patient informed of visitor policy.  Belongings cell phone , clothing, electronic device.     Brief Clinical Picture:  Neuro AXO 4; drowsy,  Resp -  2 L  NC.   Skin Assessment  Two nurse skin assessment performed with: Christina < RN    Areas observed with redness or injury: None, 5 lap site on abd, dermabond. CDI

## 2022-12-07 NOTE — Anesthesia Postprocedure Evaluation (Signed)
Anesthesia Post Evaluation    Patient: Victoria Woodward    Procedure(s):  ROBOT XI ASSISTED, LAPAROSCOPIC, SLEEVE GASTRECTOMY EGD  LAPAROSCOPIC OMENTOPEXY W/ GASTRIC RESTRICTIVE PROCEDURE  EGD, BIOPSY    Anesthesia type: general    Last Vitals:   Vitals Value Taken Time   BP 119/76 12/07/22 1600   Temp 36.6 C (97.9 F) 12/07/22 1510   Pulse 80 12/07/22 1600   Resp 14 12/07/22 1600   SpO2 97 % 12/07/22 1600                 Anesthesia Post Evaluation:     Patient Evaluated: PACU  Patient Participation: complete - patient participated  Level of Consciousness: responsive to verbal stimuli  Pain Score: 1  Pain Management: adequate    Airway Patency: patent        Anesthetic complications: No      PONV Status: none    Cardiovascular status: stable  Respiratory status: spontaneous ventilation  Hydration status: acceptable          Signed by: Billee Cashing, MD, 12/07/2022 4:11 PM

## 2022-12-07 NOTE — Interval H&P Note (Signed)
Drs. Missy Sabins, Coy Saunas, and [address], [address]   [place], [place] [address]   [phone]   [phone]     [address], [address]  [place], [place] [address]   [phone]   [phone]    I have examined the patient and reviewed the H&P. No pertinent change in the patient's condition since the H&P was completed. Patient is for Robotic/Laparoscopic sleeve gastrectomy, possible hiatal hernia repair, EGD.    Physical Exam:   BP 168/74   Pulse 90   Temp 97.5 F (36.4 C) (Temporal)   Resp 18   Wt 268 lb 3.2 oz   SpO2 97%   BMI 47.51 kg/m   General Appearance:    Alert, cooperative, no distress, obese   Head:    Normocephalic, without obvious abnormality, atraumatic   Eyes:     EOM's intact, no scleral icterus,                   Lungs:     Clear to auscultation bilaterally, respirations unlabored       Heart:    Regular rate and rhythm   Abdomen:     Soft, non-tender, bowel sounds active all four quadrants,     no masses, no organomegaly                   Neurologic:   Normal strength       throughout       Hunt Oris, MD   1:00 PM 12/07/2022

## 2022-12-07 NOTE — Discharge Instr - AVS First Page (Addendum)
Drs. Moazzez, Nain, Pourshojae, and Culbreath  [address], [address]   [place], [place] [address]   (O) [phone]   (F) [phone]    [address], [address]   [place], [place] [address]   (O) [phone]   (F) [phone]       PATIENT DISCHARGE INFORMATION: BARIATRIC SURGERY    We are concerned about your health and have developed a few guidelines to help you during your hospitalization and at home. Please call your physician if you have any questions about your care when you get home.    DIET    Only sugar free, caffeine free, non carbonated liquids are allowed. Bariatric clear liquids for 2 weeks plus protein shakes, and then bariatric full liquids for the following week until advanced by your physician or dietician.  Make sure to take small sips all day long. Please refer to the diet guidelines provided by your dietitian. Do not eat solids.     Increase your liquid intake from a minimum of 6 to 8 cups a day as tolerated. Sip on water throughout the day. Ensure to get a minimum of 50 grams (for women) and 63 grams (men) of protein per day from your supplement.     The 30/30 rule (not drinking liquids for 30 minutes before meals or 30 minutes after meals) will apply after you are finished with your liquid diet.    Avoid foods high in sugar or fat. Maintain an adequate protein intake. Avoid nuts, popcorn, some citrus fruits, and soft breads for 2 months. Take chewable multivitamin supplement daily starting two weeks after your surgery date.     If vomiting occurs, and persists more than 24 hours, please notify your doctor.    ACTIVITY    Avoid heavy lifting (more than 20 pounds) or strenuous exercise for at least 4 weeks.    Walk daily, gradually increasing speed with a goal of 2 miles.  Ok to go up and down stairs.    MEDICATION    Follow the medication instructions as written by your doctor. Break all medications in half or crush unless advised differently from your doctor.    You should  refrain from drinking alcohol, you should refrain from driving if you are taking a narcotic medication for pain.    If the pain is unrelieved by the prescribed medication, please call you doctor.    Take all medications as prescribed.  - Please start taking your vitamins 2 weeks after surgery    - Take the Tylenol (650 mg or 20 mL) every four hours for pain control for the next 3-5 days and then as needed.  Suggested times are 7:00am, 11:00am, 3:00pm, and 7:00pm.  You can add another dose at 11:00pm if needed before bedtime . Do not exceed 4 grams (4000 mg) of tylenol total in 24 hours. The current dosing will give you 2600 mg- 3250 mg which is below the daily maximum.     - Take the gabapentin three times a day for the next 3-5 days and then as needed for pain.  This is for sharper pain.    - Take the oxycodone (or Dilaudid, whichever you were prescribed) as needed for severe pain.  This is a narcotic and has additive properties if used and used for a prolonged period of time.  You cannot operate heavy machinery (a vehicle) while on narcotics.  Contact your insurance company for official recommendations for when you can drive after discontinuing   narcotic medications.     - Take Prevacid (lansoprazole) or Prilosec (omeprazole), whichever you were prescribed, every day.  This is for acid and prevention of ulcers.  It is important to take this even if you don't feel like you're having acid reflux.    - Use Zofran (ondansetron) as needed for nausea.    - If you have received Lovenox (blood thinner), please use as instructed (2 -4 weeks)    - If you are prescribed URSODIOL (Actigall), start that medication 2 weeks after surgery.  This is for prevention of gallstones.  You can still develop gallstones but this therapy helps to limit this process with rapid weight loss.     - IF you experience constipation:   Drink at least 80 oz fluids daily  Walk regularly   Dulcolax suppository daily x3 days to get things  moving   Enemas, as needed, to soften hard stools   If at least 5 days out from surgery, may try Miralax  Once you start having softer stools, add in a fiber supplement daily (ie Metamucil, Benefiber, Citrucel-SF etc)  If you are unable to have a bowel movement and feeling abdominal pain and/or nausea/vomiting, please notify the office      INCISION    Keep your incision clean and dry. You may shower daily and pat your incision dry. No bathing or swimming for 4 weeks.    Check you incision daily and notify your doctor if you notice any redness or drainage.    It is not necessary to take your temperature routinely, but if you feel like you have a fever and it is 101 degrees Farenheit or more, please call your doctor.    OTHER CARE MEASURES    Continue to use incentive spirometer 10 times per hour while awake until your next doctor visit.  Make sure you are drinking enough and urinating at least 4-6 times per day.    Notify physician with:  -fever >101  -one sided calf pain  -vomiting  -increasing abdominal pain  -sudden onset shoulder pain    MAKE SURE YOU REMAIN HYDRATED.  You can keep water with you at all times and should sip to maintain hydration.     TENTATIVE SCHEDULE:    7:00am:  Morning water 6-8oz, Breakfast, Tylenol/Gabapentin  8:00am:  Daily Medications  9:00am:  Protein Shake/Lovenox  10:00am:  Vitamins  11:00am:  Water 6-8 oz, Tylenol  12:00pm:  Lunch  1:00pm:  Water 6-8 oz  2:00pm:  Water 6-8 oz  3:00pm:  Medications (Gabapentin/Tylenol)  4:00pm:  Water 6-8 oz  5:00pm:  Dinner  6:00pm:  Water 6-8 oz  7:00pm:  Multivitamin, Tylenol  9:00pm:  Water, Gabapentin, second Lovenox (if prescribed twice daily)  11:00pm: Tylenol if needed    IF YOU ARE TOLD BY A PHYSICIAN TO RETURN TO THE ER, PLEASE ONLY GO TO [facility] EMERGENCY DEPARTMENT. Thank you.     CONGRATULATIONS on your commitment to your success.  Fall in love with your success and JOIN US ON FACEBOOK!    This online community is  designed to be a site for support to all of our POST OPERATIVE bariatric patients.  This will connect you with our staff and our patients.  Follow the process and share your story.  YOU ARE NOT ALONE and we all need to be engaged to support and educate each other.  Welcome to the [facility] Family.    [email or web address]

## 2022-12-07 NOTE — Progress Notes (Addendum)
A+0X 4, drowsy  Vitals: WNL  Pain: pain level 10/10. Being managed with prn oxycodone and IV dilaudid   Dressing: clean, dry & intact, lap surgical site   Incision site: abd    Voiding: no (DTV 2100)  Passing Gas: no  BM: no   Net I&O for admission: Net IO Since Admission: 1,320 mL [12/07/22 1852]  Net I&O for past 24 hours:   Intake/Output Summary (Last 24 hours) at 12/07/2022 1852  Last data filed at 12/07/2022 1510  Gross per 24 hour   Intake 1320 ml   Output --   Net 1320 ml         Pt have pain 10/10. oxycodone and Iv dilaudid given. No nausea/vomit.

## 2022-12-07 NOTE — Plan of Care (Addendum)
 NURSE NOTE SUMMARY  [facility] - [facility]NEW ORTHOPEDICS   Patient Name: Victoria Woodward   Attending Physician: Tommye Standard, MD   Today's date:   12/08/2022 LOS: 1 days   Shift Summary:                                                                A+0X4  Vitals: WNL  Pain: Pain is not under control - though nurse has given all alternatiive pain meds. Will review meds with attending .Managed with tylenol, Oxycodone, Dilaudid, gabapentin, and adjuct meds- levsin and simethacone.  Incision site: abdominal lap sites [id number]  Ambulating: yes Steady Gait SBA  Voiding: yes Clear Yellow urine  Passing Gas: no  BM: no   Bariatric:  Patient able to tolerate 2 oz/hr without s/s of overfilling.     Provider Notifications:      Rapid Response Notifications:  Mobility:      PMP Activity: Step 7 - Walks out of Room (12/07/2022 10:25 PM)     Weight tracking:  Family Dynamic:   Last 3 Weights for the past 72 hrs (Last 3 readings):   Weight   12/07/22 1728 121.6 kg (268 lb)   12/07/22 1251 121.7 kg (268 lb 3.2 oz)             Recent Vitals Last Bowel Movement   BP: 128/87 (12/08/2022  7:53 AM)  Heart Rate: 70 (12/08/2022  7:53 AM)  Temp: 98.6 F (37 C) (12/08/2022  7:53 AM)  Resp Rate: 18 (12/08/2022  7:53 AM)  Height: 1.6 m (5\' 3" ) (12/07/2022  5:28 PM)  Weight: 121.6 kg (268 lb) (12/07/2022  5:28 PM)  SpO2: 96 % (12/08/2022  7:53 AM)   No data recorded        Problem: Day of Surgery- Sleeve Gastrectomy Surgery  Goal: Stable vital signs and fluid balance  Flowsheets (Taken 12/07/2022 2243)  Stable vital signs and fluid balance:   Monitor vital signs   Monitor/assess O2 saturation   Monitor intake and output. Notify LIP if urine output is less than 240 mL in 8 hours   Monitor lab values. Notify LIP of abnormal results.  Goal: Tissue perfusion is adequate  Flowsheets (Taken 12/07/2022 2243)  Tissue perfusion is adequate:   Assess and monitor skin color and temperature   VTE Prevention: administer anticoagulant(s) and/or apply  anti-embolism stockings/devices as ordered   Teach/review/reinforce ankle pump exercises  Goal: Nutritional intake is adequate  Flowsheets (Taken 12/07/2022 2243)  Nutritional intake is adequate:   Monitor intake and output   Assess GI status (bowel sounds, nausea/vomiting, distention, flatus)   Bariatric dietician consult as indicated for next day   Administer acid-reducer meds as prescribed   Provide sips of water. Do not use straws.  Goal: Mobility/activity is maintained at optimum level  Flowsheets (Taken 12/07/2022 2243)  Mobility/activity is maintained at optimum level:   Out of bed with assistance as needed   Out of bed to chair if indicated with assistance as needed   Ambulate at least once per shift with assistance  Goal: Patient/patient care companion demonstrates understanding of surgery/treatment plan, medications, and discharge plans  Flowsheets (Taken 12/07/2022 2243)  Patient/patient care companion demonstrates understanding of surgery/treatment plan, medications, and discharge:   Initiate/review  discharge plans with patient/patient care companion   Provide/review education on patient's medications, medication side effects, and post-operative management to patient/patient care companion

## 2022-12-08 ENCOUNTER — Encounter: Payer: Self-pay | Admitting: Surgery

## 2022-12-08 LAB — BASIC METABOLIC PANEL
Anion Gap: 7 (ref 5.0–15.0)
BUN: 11 mg/dL (ref 7.0–21.0)
CO2: 26 mEq/L (ref 17–29)
Calcium: 8.8 mg/dL (ref 8.5–10.5)
Chloride: 105 mEq/L (ref 99–111)
Creatinine: 1 mg/dL (ref 0.4–1.0)
Glucose: 100 mg/dL (ref 70–100)
Potassium: 4.2 mEq/L (ref 3.5–5.3)
Sodium: 138 mEq/L (ref 135–145)
eGFR: >60 mL/min/{1.73_m2} (ref >=60)

## 2022-12-08 LAB — CBC
Absolute NRBC: 0 10*3/uL (ref 0.00–0.00)
Hematocrit: 36.8 % (ref 34.7–43.7)
Hgb: 11.9 g/dL (ref 11.4–14.8)
MCH: 28.6 pg (ref 25.1–33.5)
MCHC: 32.3 g/dL (ref 31.5–35.8)
MCV: 88.5 fL (ref 78.0–96.0)
MPV: 10.6 fL (ref 8.9–12.5)
Nucleated RBC: 0 /100 WBC (ref 0.0–0.0)
Platelets: 254 10*3/uL (ref 142–346)
RBC: 4.16 10*6/uL (ref 3.90–5.10)
RDW: 15 % (ref 11–15)
WBC: 9.67 10*3/uL — ABNORMAL HIGH (ref 3.10–9.50)

## 2022-12-08 LAB — WHOLE BLOOD GLUCOSE POCT: Whole Blood Glucose POCT: 108 mg/dL — ABNORMAL HIGH (ref 70–100)

## 2022-12-08 LAB — PHOSPHORUS: Phosphorus: 3.8 mg/dL (ref 2.3–4.7)

## 2022-12-08 LAB — MAGNESIUM: Magnesium: 2 mg/dL (ref 1.6–2.6)

## 2022-12-08 MED ORDER — KETOROLAC TROMETHAMINE 30 MG/ML IJ SOLN
30.0000 mg | Freq: Four times a day (QID) | INTRAMUSCULAR | Status: DC | PRN
Start: 2022-12-08 — End: 2022-12-09
  Administered 2022-12-08 – 2022-12-09 (×3): 30 mg via INTRAVENOUS
  Filled 2022-12-08 (×3): qty 1

## 2022-12-08 MED ORDER — HYOSCYAMINE SULFATE 0.125 MG PO TABS: 0.1250 mg | ORAL_TABLET | ORAL | 0 refills | Status: DC | PRN

## 2022-12-08 MED ORDER — NALOXONE HCL 4 MG/0.1ML NA LIQD: NASAL | 0 refills | Status: DC

## 2022-12-08 MED ORDER — HYDROMORPHONE HCL 2 MG PO TABS
2.0000 mg | ORAL_TABLET | ORAL | 0 refills | Status: AC | PRN
Start: 2022-12-08 — End: 2022-12-15

## 2022-12-08 MED ORDER — KETOROLAC TROMETHAMINE 30 MG/ML IJ SOLN
15.0000 mg | Freq: Once | INTRAMUSCULAR | Status: DC
Start: 2022-12-08 — End: 2022-12-08

## 2022-12-08 MED ORDER — HYDROMORPHONE HCL 2 MG PO TABS
2.0000 mg | ORAL_TABLET | ORAL | Status: DC | PRN
Start: 2022-12-08 — End: 2022-12-09
  Administered 2022-12-08 – 2022-12-09 (×7): 2 mg via ORAL
  Filled 2022-12-08 (×7): qty 1

## 2022-12-08 NOTE — Progress Notes (Signed)
NURSE NOTE SUMMARY  [place]   Patient Name: Victoria Woodward   Attending Physician: Hunt Oris, MD   Today's date:   12/08/2022 LOS: 1 days   Shift Summary:                                                                A+0X4  Vitals: WNL  Pain: Pain is manageable on current regimen Managed with oral Dilaudid, Levsin and Toradol.   Dressing: clean, dry & intact  Incision site: Abdomen  Ambulating: yes Steady Gait  Voiding: yes Clear Yellow urine  Passing Gas: yes  BM: no   Bariatric:  Patient able to tolerate 2-3 oz/hr without s/s of overfilling.        Provider Notifications:      Rapid Response Notifications:  Mobility:      PMP Activity: Step 4 - Dangle at Bedside (12/08/2022  9:00 AM)     Weight tracking:  Family Dynamic:   Last 3 Weights for the past 72 hrs (Last 3 readings):   Weight   12/07/22 1728 121.6 kg (268 lb)   12/07/22 1251 121.7 kg (268 lb 3.2 oz)             Recent Vitals Last Bowel Movement   BP: 117/86 (12/08/2022  3:48 PM)  Heart Rate: 77 (12/08/2022  3:48 PM)  Temp: 98.2 F (36.8 C) (12/08/2022  3:48 PM)  Resp Rate: 16 (12/08/2022  3:48 PM)  Height: 1.6 m (5\' 3" ) (12/07/2022  5:28 PM)  Weight: 121.6 kg (268 lb) (12/07/2022  5:28 PM)  SpO2: 96 % (12/08/2022  3:48 PM)   No data recorded

## 2022-12-08 NOTE — Consults (Signed)
**Note De-Identified Anneli Bing Obfuscation** Inpatient Bariatric Nutrition Education       Surgery Type: sleeve  Surgery Date: 12/07/22    Nutrition education done by surgeon's RD pre-operatively: Yes    Reinforced nutritional guidelines: Yes    Instructed patient on adequate fluid intake with goal of drinking 64 ounces/day: Yes    Patient verbalized understanding of diet progression: Yes    Patient has home supply of protein supplements: Yes    Patient has home supply of vitamins and minerals: No    Going Home Nutrition information reviewed with patient: Yes    Educational Financial controller or emailed to patient: Yes    Patient verbalized comprehension of bariatric nutrition guidelines to follow upon discharge: Yes    Notes: Questions answered.     Carmelina Dane Shaquila Sigman MS RD
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
## 2022-12-08 NOTE — Plan of Care (Signed)
Problem: Pain interferes with ability to perform ADL  Goal: Pain at adequate level as identified by patient  Outcome: Progressing  Flowsheets (Taken 12/08/2022 1031)  Pain at adequate level as identified by patient:   Identify patient comfort function goal   Assess for risk of opioid induced respiratory depression, including snoring/sleep apnea. Alert healthcare team of risk factors identified.   Assess pain on admission, during daily assessment and/or before any "as needed" intervention(s)   Reassess pain within 30-60 minutes of any procedure/intervention, per Pain Assessment, Intervention, Reassessment (AIR) Cycle   Evaluate if patient comfort function goal is met   Offer non-pharmacological pain management interventions   Evaluate patient's satisfaction with pain management progress     Problem: Day of Surgery- Sleeve Gastrectomy Surgery  Goal: Stable vital signs and fluid balance  Flowsheets (Taken 12/08/2022 1031)  Stable vital signs and fluid balance:   Monitor vital signs   Monitor intake and output. Notify LIP if urine output is less than 240 mL in 8 hours   Monitor/assess O2 saturation   Monitor lab values. Notify LIP of abnormal results.  Goal: Nutritional intake is adequate  Flowsheets (Taken 12/08/2022 1031)  Nutritional intake is adequate:   Monitor intake and output   Assess GI status (bowel sounds, nausea/vomiting, distention, flatus)   Provide sips of water. Do not use straws.     Problem: Post Op Day 1- Sleeve Gastrectomy Surgery  Goal: Stable vital signs and fluid balance  Outcome: Progressing  Flowsheets (Taken 12/08/2022 1031)  Patient has stable vital signs and fluid balance:   Monitor vital signs   Monitor/assess O2 saturation   Monitor intake and output. Notify LIP if urine output is less than 240 mL in 8 hours.   Monitor lab values. Notify LIP of abnormal results.  Goal: Pain at adequate level  Outcome: Progressing  Flowsheets (Taken 12/08/2022 1031)  Pain at adequate level as identified by  patient:   Identify and evaluate patient comfort function goal   Crush oral pain medication per physician order/preference  Goal: Tissue perfusion is adequate  Outcome: Progressing  Flowsheets (Taken 12/08/2022 1031)  Tissue perfusion is adequate:   Assess and monitor skin color and temperature   VTE Prevention: administer anticoagulant(s) and/or apply anti-embolism stockings/devices as ordered   Teach/review/reinforce ankle pump exercises  Goal: Nutritional Intake is adequate  Outcome: Progressing  Flowsheets (Taken 12/08/2022 1031)  Nutritional intake is adequate:   Do not use straws   All oral meds are crushed for administration, as ordered   Assess GI status (bowel sounds, nausea/vomiting, distention, flatus)

## 2022-12-08 NOTE — Plan of Care (Addendum)
NURSE NOTE SUMMARY  [place]   Patient Name: Victoria Woodward   Attending Physician: Hunt Oris, MD   Today's date:   12/09/2022 LOS: 2 days   Shift Summary:                                                                A+0X4  Vitals: WNL  Pain: Pain is manageable on current regimen Managed with po dilaudid  Incision site: x5 abdominal lap sites  Ambulating: yes Steady   Voiding: yes Clear Yellow urine  Passing Gas: no  BM: no   Bariatric:  Patient able to tolerate bariatric clears up to 4 oz/hr without s/s of overfilling.     Provider Notifications:      Rapid Response Notifications:  Mobility:      PMP Activity: Step 7 - Walks out of Room (12/08/2022 10:57 PM)     Weight tracking:  Family Dynamic:   Last 3 Weights for the past 72 hrs (Last 3 readings):   Weight   12/07/22 1728 121.6 kg (268 lb)   12/07/22 1251 121.7 kg (268 lb 3.2 oz)             Recent Vitals Last Bowel Movement   BP: 128/89 (12/08/2022 10:54 PM)  Heart Rate: 89 (12/08/2022 10:54 PM)  Temp: 98.6 F (37 C) (12/08/2022 10:54 PM)  Resp Rate: 16 (12/08/2022 10:54 PM)  SpO2: 96 % (12/08/2022 10:54 PM)   No data recorded        Problem: Pain interferes with ability to perform ADL  Goal: Pain at adequate level as identified by patient  Flowsheets (Taken 12/08/2022 2001)  Pain at adequate level as identified by patient:   Identify patient comfort function goal   Assess for risk of opioid induced respiratory depression, including snoring/sleep apnea. Alert healthcare team of risk factors identified.   Assess pain on admission, during daily assessment and/or before any "as needed" intervention(s)   Reassess pain within 30-60 minutes of any procedure/intervention, per Pain Assessment, Intervention, Reassessment (AIR) Cycle   Evaluate if patient comfort function goal is met   Offer non-pharmacological pain management interventions     Problem: Post Op Day 1- Sleeve Gastrectomy Surgery  Goal: Stable vital signs and fluid  balance  Flowsheets (Taken 12/08/2022 2001)  Patient has stable vital signs and fluid balance:   Monitor vital signs   Monitor/assess O2 saturation   Monitor intake and output. Notify LIP if urine output is less than 240 mL in 8 hours.   Monitor lab values. Notify LIP of abnormal results.  Goal: Effective ventilation maintained  Flowsheets (Taken 12/08/2022 2001)  Effective ventilation maintained:   Monitor/assess patient's mental status and LOC   Administer O2 therapy as ordered   Position patient for maximum ventilatory efficiency, head of bed at 30 degrees   Apply CPAP as ordered   Teach/reinforce use of incentive spirometer 10 times per hour while awake, cough and deep breath as needed  Goal: Mobility/activity is maintained at optimum level  Flowsheets (Taken 12/08/2022 2001)  Mobility/activity is maintained at optimum level: Supervised ambulation 4 times a day     Problem: Compromised Nutrition  Goal: Nutrition Interventions  Flowsheets (Taken 12/08/2022 2001)  Nutrition Interventions: Discuss nutrition at  Rounds, I&Os, Document % meal eaten, Daily weights

## 2022-12-08 NOTE — Progress Notes (Addendum)
[facility]  Progress Note    Date Time: 12/08/2022  Patient Name: Victoria Woodward    Subjective:     Did not sleep much overnight. Oxycodone wears off for pain control. Voided once. Nausea but no vomiting.     Medications:     Current Facility-Administered Medications   Medication Dose Route Frequency Last Rate Last Admin    0.9 % NaCl with KCl 20 mEq infusion   Intravenous Continuous 130 mL/hr at 12/08/22 0010 New Bag at 12/08/22 0010    0.9% NaCl infusion   Intravenous Continuous        acetaminophen (TYLENOL) 160 MG/5ML oral liquid 650 mg  650 mg Oral Q4H        acetaminophen (TYLENOL/OFIRMEV) injection 1,000 mg  1,000 mg Intravenous Q6H   Stopped at 12/08/22 0031    albuterol (PROVENTIL) (2.5 MG/3ML) 0.083% nebulizer solution 2.5 mg  2.5 mg Nebulization Q6H PRN        ALPRAZolam (XANAX) tablet 1 mg  1 mg Oral TID PRN        amLODIPine (NORVASC) tablet 2.5 mg  2.5 mg Oral Daily        amphetamine-dextroamphetamine (ADDERALL) tablet 15 mg  15 mg Oral 2XDAY at 0800 & 1200        bisacodyl (DULCOLAX) suppository 10 mg  10 mg Rectal Daily PRN        dextrose (GLUCOSE) 40 % oral gel 15 g of glucose  15 g of glucose Oral PRN        Or    dextrose (D10W) 10% bolus 125 mL  12.5 g Intravenous PRN        Or    dextrose 50 % bolus 12.5 g  12.5 g Intravenous PRN        Or    glucagon (rDNA) (GLUCAGEN) injection 1 mg  1 mg Intramuscular PRN        diphenhydrAMINE (BENADRYL) injection 12.5 mg  12.5 mg Intravenous Q6H PRN        DULoxetine (CYMBALTA) DR capsule 30 mg  30 mg Oral QAM        enoxaparin (LOVENOX) syringe 40 mg  40 mg Subcutaneous BID        famotidine (PEPCID) injection 20 mg  20 mg Intravenous Q12H [place]   20 mg at 12/07/22 2110    gabapentin (NEURONTIN) capsule 300 mg  300 mg Oral Q8H   300 mg at 12/08/22 0422    hydrALAZINE (APRESOLINE) injection 10 mg  10 mg Intravenous Q6H PRN        HYDROmorphone (DILAUDID) injection 1 mg  1 mg Intravenous Q3H PRN   1 mg at 12/08/22 0543    HYDROmorphone  (DILAUDID) tablet 2 mg  2 mg Oral Q3H PRN        hyoscyamine SL (LEVSIN/SL) tablet 0.125 mg  0.125 mg Sublingual Q6H PRN   0.125 mg at 12/08/22 0543    lactated ringers infusion   Intravenous Continuous   Stopped at 12/07/22 1510    ondansetron (ZOFRAN) injection 4 mg  4 mg Intravenous Q6H PRN        prochlorperazine (COMPAZINE) injection 5 mg  5 mg Intravenous Q6H PRN        scopolamine (TRANSDERM-SCOP) patch 1 mg/72 hrs 1 patch  1 patch Transdermal Q72H PRN        simethicone (MYLICON) chewable tablet 80 mg  80 mg Oral Q6H PRN   80 mg at 12/08/22 [id number]  Physical Exam:     Visit Vitals  BP 128/72   Pulse 80   Temp 98.2 F (36.8 C) (Oral)   Resp 17   Ht 1.6 m (5\' 3" )   Wt 121.6 kg (268 lb)   SpO2 99%   BMI 47.47 kg/m     Intake and Output Summary (Last 24 hours) at Date Time    Intake/Output Summary (Last 24 hours) at 01/04/18 0643  Last data filed at 01/04/18 0118      Intake/Output Summary (Last 24 hours) at 12/08/2022 0723  Last data filed at 12/08/2022 0421  Gross per 24 hour   Intake 1440 ml   Output 1210 ml   Net 230 ml     General appearance - alert, well appearing, and in no distress  Chest - nonlabored respirations, equal chest rise bilaterally on 2L supplemental oxygen, no accessory muscle use  Heart - normal rate, regular rhythm  Abdomen - soft, obese, non distended, incisions intact and appropriately tender with skin glue  Extremities - no pedal edema    Labs:     Results       Procedure Component Value Units Date/Time    Basic Metabolic Panel [[id number]] Collected: 12/08/22 0422    Specimen: Blood Updated: 12/08/22 0524     Glucose 100 mg/dL      BUN 11.0 mg/dL      Creatinine 1.0 mg/dL      Calcium 8.8 mg/dL      Sodium 138 mEq/L      Potassium 4.2 mEq/L      Chloride 105 mEq/L      CO2 26 mEq/L      Anion Gap 7.0     eGFR >60.0 mL/min/1.73 m2     Magnesium [[id number]] Collected: 12/08/22 0422    Specimen: Blood Updated: 12/08/22 0524     Magnesium 2.0 mg/dL     Phosphorus [[id number]] Collected:  12/08/22 0422    Specimen: Blood Updated: 12/08/22 0524     Phosphorus 3.8 mg/dL     CBC without differential [[id number]]  (Abnormal) Collected: 12/08/22 0422    Specimen: Blood Updated: 12/08/22 0449     WBC 9.67 x10 3/uL      Hgb 11.9 g/dL      Hematocrit 36.8 %      Platelets 254 x10 3/uL      RBC 4.16 x10 6/uL      MCV 88.5 fL      MCH 28.6 pg      MCHC 32.3 g/dL      RDW 15 %      MPV 10.6 fL      Nucleated RBC 0.0 /100 WBC      Absolute NRBC 0.00 x10 3/uL               Assessment:     This is a 42 y.o. female with obesity who is POD # 1 status post SG, doing well.     Plan:     Diet: bariatric liquid diet  IVF  Pain and nausea control, switch oxycodone to PO dilaudid. One time dose of IV toradol  Encourage ambulation/incentive spirometer and fluids  Continue oral medications  Wean supplemental oxygen to room air  HTN: home norvasc. Normotensive this AM  DVT PPX: LVX  GI Ppx: Pepcid  Discharge home once meeting all milestones, pending fluid intake, pain and nausea control    Monico Hoar, DO  PGY-6 Bariatric Surgery Fellow  [facility]  12/08/2022 7:24 AM    I have personally interviewed and examined the patient.  I have reviewed the provider's history, exam, assessment and management plans. I concur with or have edited all elements of the provider's note.      Patient feels ok, having pain.  Oxy not helping much will change to PO dilaudid.  Tolerating some liquids, but voiding, and ambulating well.  If able to tolerate 3-4 oz per hour, ok to d/c later today.  Will add torodol, binder, etc.    Hunt Oris, MD

## 2022-12-08 NOTE — Progress Notes (Signed)
Anesthesia Post-Op Check    Pt interviewed and denies any anesthetic complications  except for sore throat & pt informed that this may last a few days. Voiding without difficulty.

## 2022-12-08 NOTE — UM Notes (Signed)
** This review is compiled from documentation provided by the treatment team within the patient's medical record. **     Velna Hatchet, RN, BSN, ACM  Clinical Case Manager - Utilization Review    [facility]  [address], [place]  [place], [place], [address]    Fax Number: [place]: [phone]  Confidential VM: [phone]  [email or web address]      Please use fax number or insurance line to provide authorization for hospital services or to request additional information.       [facility]   ([place]) [facility]   ([place]) [facility]   [facility]) [facility]   [facility]) [facility]  [facility])   [place].  [place], [place] [address]  [place] [address] [address]  [place], [place] [address] [phone] [facility].  [place], [place] [address] [address]  [place], [place] [address]   NPI: [id number]  Tax ID: [id number] NPI: [id number]  Tax ID: [id number] NPI: [id number]  Tax ID: [id number] NPI: [id number]  Tax ID: [id number] NPI: [id number]  Tax ID: [id number]       Clinical Review   Date of Service: 1/24-1/25    Patient Name: Victoria Woodward  DOB: 24-Jan-1981      Elective Admission    KTGY#[id number]     Status Order:  ADMIT TO INPATIENT (Order #[id number]) on 12/07/22       Per H&P:  I have examined the patient and reviewed the H&P. No pertinent change in the patient's condition since the H&P was completed. Patient is for Robotic/Laparoscopic sleeve gastrectomy, possible hiatal hernia repair, EGD.       General Information      Date: 12/07/2022 Time: 1400 Status: Posted   Location: [facility] OR Room: RM 02 DAVINCI Service: General   Patient class: Surgery Admit (IP) Case classification: Elective           Panel Information    Panel 1    Provider Role   Monico Hoar, DO Fellow   Hunt Oris, MD Primary    Procedure Laterality Anesthesia   ROBOT XI  ASSISTED, LAPAROSCOPIC, SLEEVE GASTRECTOMY EGD [62035 (CPT)] N/A General   LAPAROSCOPIC OMENTOPEXY W/ GASTRIC RESTRICTIVE PROCEDURE [59741 (CPT)] N/A General   EGD, BIOPSY [63845 (CPT)] N/A General        Additional CPT Codes As Scheduled    CPT(R) CPT(R) Name   43775 PR LAPS [place] RSTRICTIV [place] LONGITUDINAL GASTRECTOMY   43239 PR EGD TRANSORAL BIOPSY SINGLE/MULTIPLE             -----Clinical Update 1/25----      Subjective:      Did not sleep much overnight. Oxycodone wears off for pain control. Voided once. Nausea but no vomiting.      Assessment:      This is a 42 y.o. female with obesity who is POD # 1 status post SG, doing well.      Plan:      Diet: bariatric liquid diet  IVF  Pain and nausea control, switch oxycodone to PO dilaudid. One time dose of IV toradol  Encourage ambulation/incentive spirometer and fluids  Continue oral medications  Wean supplemental oxygen to room air  HTN: home norvasc. Normotensive this AM  DVT PPX: LVX  GI Ppx: Pepcid  Discharge home once meeting all milestones, pending fluid intake, pain and nausea control  Vitals:    12/07/22 2339 12/08/22 0318 12/08/22 0753 12/08/22 0906   BP: 131/79 128/72 128/87 130/80   Pulse: 77 80 70    Resp: 17 17 18     Temp: 98.2 F (36.8 C)  98.6 F (37 C)    TempSrc: Oral  Oral    SpO2: 99% 99% 96%    Weight:       Height:         Scheduled Meds:  Current Facility-Administered Medications   Medication Dose Route Frequency    acetaminophen  650 mg Oral Q4H    amLODIPine  2.5 mg Oral Daily    amphetamine-dextroamphetamine  15 mg Oral 2XDAY at 0800 & 1200    DULoxetine  30 mg Oral QAM    enoxaparin  40 mg Subcutaneous BID    famotidine  20 mg Intravenous Q12H [facility]    gabapentin  300 mg Oral Q8H     Continuous Infusions:   0.9 % NaCl with KCl 20 mEq 130 mL/hr at 12/08/22 0010    sodium chloride      lactated ringers Stopped (12/07/22 1510)     PRN Meds:  HYDROmorphone (DILAUDID) injection 1 mg  Dose: 1 mg  Freq: Every 3 hours PRN Route: IV  PRN  Reason: severe pain  Start: 12/07/22 1724   Given x2    oxyCODONE (ROXICODONE) 5 MG/5ML solution 5 mg  Dose: 5 mg  Freq: Every 4 hours PRN Route: PO  PRN Comment: Moderate Pain  Start: 12/07/22 1724 End: 12/08/22 0710   Given x2    Dilaudid 2mg  x1  Levsin 0.125mg  x1  Mylicon 80mg  x1    CBC without differential [[id number]]  (Abnormal) Collected: 12/08/22 0422      Specimen: Blood Updated: 12/08/22 0449       WBC 9.67 x10 3/uL

## 2022-12-09 DIAGNOSIS — Z6841 Body Mass Index (BMI) 40.0 and over, adult: Secondary | ICD-10-CM

## 2022-12-09 NOTE — Plan of Care (Signed)
Problem: Pain interferes with ability to perform ADL  Goal: Pain at adequate level as identified by patient  Outcome: Adequate for Discharge     Problem: Side Effects from Pain Analgesia  Goal: Patient will experience minimal side effects of analgesic therapy  Outcome: Adequate for Discharge     Problem: Day of Surgery- Sleeve Gastrectomy Surgery  Goal: Stable vital signs and fluid balance  Outcome: Adequate for Discharge  Goal: Pain at adequate level  Outcome: Adequate for Discharge  Goal: Effective ventilation maintained  Outcome: Adequate for Discharge  Goal: Tissue perfusion is adequate  Outcome: Adequate for Discharge  Goal: Nutritional intake is adequate  Outcome: Adequate for Discharge  Goal: Mobility/activity is maintained at optimum level  Outcome: Adequate for Discharge  Goal: Patient/patient care companion demonstrates understanding of surgery/treatment plan, medications, and discharge plans  Outcome: Adequate for Discharge     Problem: Moderate/High Fall Risk Score >5  Goal: Patient will remain free of falls  Outcome: Adequate for Discharge     Problem: Post Op Day 1- Sleeve Gastrectomy Surgery  Goal: Stable vital signs and fluid balance  Outcome: Adequate for Discharge  Goal: Pain at adequate level  Outcome: Adequate for Discharge  Goal: Effective ventilation maintained  Outcome: Adequate for Discharge  Goal: Tissue perfusion is adequate  Outcome: Adequate for Discharge  Goal: Nutritional Intake is adequate  Outcome: Adequate for Discharge  Goal: Mobility/activity is maintained at optimum level  Outcome: Adequate for Discharge  Goal: Patient/patient care companion demonstrates understanding of surgery/treatment plan, medications, and discharge plans  Outcome: Adequate for Discharge     Problem: Compromised Sensory Perception  Goal: Sensory Perception Interventions  Outcome: Adequate for Discharge     Problem: Compromised Nutrition  Goal: Nutrition Interventions  Outcome: Adequate for Discharge

## 2022-12-09 NOTE — Progress Notes (Signed)
Patient discharged to home. IV removed. Medications filled and brought to room by outpatient pharmacy. D/C instructions discussed at bedside. Educated on diet modifications, incision care, activity instructions, follow-up appointments, and new medications.  Family at bedside to transport patient home. Pt able to tolerate 3-4oz/hr. Pt in no acute pain or distress at the time. Pt walked down by staff.

## 2022-12-09 NOTE — Progress Notes (Addendum)
[facility]  Progress Note    Date Time: 12/09/2022  Patient Name: Victoria Woodward    Subjective:     NAEON. Pain control and nausea improved. Ambulating and voiding spontaneously.     Medications:     Current Facility-Administered Medications   Medication Dose Route Frequency Last Rate Last Admin    0.9 % NaCl with KCl 20 mEq infusion   Intravenous Continuous 130 mL/hr at 12/08/22 2336 New Bag at 12/08/22 2336    0.9% NaCl infusion   Intravenous Continuous        acetaminophen (TYLENOL) 160 MG/5ML oral liquid 650 mg  650 mg Oral Q4H   650 mg at 12/09/22 0509    albuterol (PROVENTIL) (2.5 MG/3ML) 0.083% nebulizer solution 2.5 mg  2.5 mg Nebulization Q6H PRN        ALPRAZolam (XANAX) tablet 1 mg  1 mg Oral TID PRN   1 mg at 12/08/22 2333    amLODIPine (NORVASC) tablet 2.5 mg  2.5 mg Oral Daily   2.5 mg at 12/08/22 0906    amphetamine-dextroamphetamine (ADDERALL) tablet 15 mg  15 mg Oral 2XDAY at 0800 & 1200        bisacodyl (DULCOLAX) suppository 10 mg  10 mg Rectal Daily PRN        dextrose (GLUCOSE) 40 % oral gel 15 g of glucose  15 g of glucose Oral PRN        Or    dextrose (D10W) 10% bolus 125 mL  12.5 g Intravenous PRN        Or    dextrose 50 % bolus 12.5 g  12.5 g Intravenous PRN        Or    glucagon (rDNA) (GLUCAGEN) injection 1 mg  1 mg Intramuscular PRN        diphenhydrAMINE (BENADRYL) injection 12.5 mg  12.5 mg Intravenous Q6H PRN        DULoxetine (CYMBALTA) DR capsule 30 mg  30 mg Oral QAM   30 mg at 12/08/22 0906    enoxaparin (LOVENOX) syringe 40 mg  40 mg Subcutaneous BID   40 mg at 12/08/22 1827    famotidine (PEPCID) injection 20 mg  20 mg Intravenous Q12H [place]   20 mg at 12/08/22 2105    gabapentin (NEURONTIN) capsule 300 mg  300 mg Oral Q8H   300 mg at 12/09/22 0509    hydrALAZINE (APRESOLINE) injection 10 mg  10 mg Intravenous Q6H PRN        HYDROmorphone (DILAUDID) injection 1 mg  1 mg Intravenous Q3H PRN   1 mg at 12/08/22 0543    HYDROmorphone (DILAUDID) tablet 2 mg  2 mg  Oral Q3H PRN   2 mg at 12/09/22 0216    hyoscyamine SL (LEVSIN/SL) tablet 0.125 mg  0.125 mg Sublingual Q6H PRN   0.125 mg at 12/08/22 2223    ketorolac (TORADOL) injection 30 mg  30 mg Intravenous Q6H PRN   30 mg at 12/09/22 9458    lactated ringers infusion   Intravenous Continuous   Stopped at 12/07/22 1510    ondansetron (ZOFRAN) injection 4 mg  4 mg Intravenous Q6H PRN        prochlorperazine (COMPAZINE) injection 5 mg  5 mg Intravenous Q6H PRN        scopolamine (TRANSDERM-SCOP) patch 1 mg/72 hrs 1 patch  1 patch Transdermal Q72H PRN        simethicone (MYLICON) chewable tablet 80 mg  80  mg Oral Q6H PRN   80 mg at 12/08/22 0027       Physical Exam:     Visit Vitals  BP 115/84   Pulse 72   Temp 98.2 F (36.8 C) (Oral)   Resp 15   Ht 1.6 m (5\' 3" )   Wt 121.6 kg (268 lb)   SpO2 98%   BMI 47.47 kg/m     Intake and Output Summary (Last 24 hours) at Date Time    Intake/Output Summary (Last 24 hours) at 01/04/18 1103  Last data filed at 01/04/18 0118      Intake/Output Summary (Last 24 hours) at 12/09/2022 0748  Last data filed at 12/09/2022 0600  Gross per 24 hour   Intake 4539.17 ml   Output 1430 ml   Net 3109.17 ml     General appearance - alert, well appearing, and in no distress  Chest - nonlabored respirations, equal chest rise bilaterally on 2L supplemental oxygen, no accessory muscle use  Heart - normal rate, regular rhythm  Abdomen - soft, obese, non distended, incisions intact and appropriately tender with skin glue  Extremities - no pedal edema    Labs:     Results       Procedure Component Value Units Date/Time    Glucose Whole Blood - POCT [[id number]]  (Abnormal) Collected: 12/08/22 1434     Updated: 12/08/22 1437     Whole Blood Glucose POCT 108 mg/dL               Assessment:     This is a 42 y.o. female with obesity who is POD # 2 status post SG, doing well.     Plan:     Diet: bariatric liquid diet  IVF  Pain and nausea control, on PO dilaudid  Encourage ambulation/incentive spirometer and  fluids  Continue oral medications  Home CPAP  HTN: home norvasc.  DVT PPX: LVX  GI Ppx: Pepcid  Discharge home today, meeting all milestones, pending fluid intake, pain and nausea control    Monico Hoar, DO  PGY-6 Bariatric Surgery Fellow  [facility]  12/09/2022 7:48 AM    I have personally interviewed and examined the patient.  I have reviewed the provider's history, exam, assessment and management plans. I concur with or have edited all elements of the provider's note.      Patient feels better today.  Tolerating liquids, voiding, and ambulating well.  If able to tolerate 3-4 oz per hour, ok to d/c     Hunt Oris, MD

## 2022-12-12 LAB — LAB USE ONLY - HISTORICAL SURGICAL PATHOLOGY

## 2022-12-13 NOTE — Progress Notes (Signed)
Erroneous encounter

## 2022-12-14 ENCOUNTER — Encounter (HOSPITAL_BASED_OUTPATIENT_CLINIC_OR_DEPARTMENT_OTHER): Payer: Self-pay

## 2022-12-21 ENCOUNTER — Encounter (HOSPITAL_BASED_OUTPATIENT_CLINIC_OR_DEPARTMENT_OTHER): Payer: Self-pay | Admitting: Family

## 2022-12-21 ENCOUNTER — Ambulatory Visit (INDEPENDENT_AMBULATORY_CARE_PROVIDER_SITE_OTHER): Payer: BLUE CROSS/BLUE SHIELD | Admitting: Registered"

## 2022-12-21 ENCOUNTER — Encounter (HOSPITAL_BASED_OUTPATIENT_CLINIC_OR_DEPARTMENT_OTHER): Payer: Self-pay | Admitting: Registered"

## 2022-12-21 ENCOUNTER — Ambulatory Visit (INDEPENDENT_AMBULATORY_CARE_PROVIDER_SITE_OTHER): Payer: BLUE CROSS/BLUE SHIELD | Admitting: Family

## 2022-12-21 VITALS — BP 137/107 | HR 84 | Temp 98.6°F | Ht 63.0 in | Wt 256.0 lb

## 2022-12-21 DIAGNOSIS — E519 Thiamine deficiency, unspecified: Secondary | ICD-10-CM

## 2022-12-21 DIAGNOSIS — Z09 Encounter for follow-up examination after completed treatment for conditions other than malignant neoplasm: Secondary | ICD-10-CM

## 2022-12-21 DIAGNOSIS — E569 Vitamin deficiency, unspecified: Secondary | ICD-10-CM

## 2022-12-21 DIAGNOSIS — Z713 Dietary counseling and surveillance: Secondary | ICD-10-CM

## 2022-12-21 DIAGNOSIS — Z719 Counseling, unspecified: Secondary | ICD-10-CM

## 2022-12-21 DIAGNOSIS — Z9884 Bariatric surgery status: Secondary | ICD-10-CM

## 2022-12-21 DIAGNOSIS — I10 Essential (primary) hypertension: Secondary | ICD-10-CM

## 2022-12-21 DIAGNOSIS — Z1321 Encounter for screening for nutritional disorder: Secondary | ICD-10-CM

## 2022-12-21 DIAGNOSIS — E559 Vitamin D deficiency, unspecified: Secondary | ICD-10-CM

## 2022-12-21 NOTE — Progress Notes (Signed)
Post-Operative Progress Note    Patient Name: Victoria Woodward, Victoria Woodward  Age: 42 y.o.  Sex: female   DOB: 1981-03-21  MRN: [id number]    HPI:     Victoria Woodward returns for postop follow up 14 days after Laparoscopic Sleeve Gastrectomy .   Feels well.   Denies fever, chills, or cough.   Denies nausea, vomiting, reflux, or abdominal pain.  Denies calf swelling or leg pain.  Tolerating liquids without problems.   Drinking 40 oz of fluids and getting 60 grams of protein from supplements daily.   Tolerating increased activity and walking regularly.     Pathology - SURGICAL PATHOLOGY REPORT    DIAGNOSIS:        PORTION OF STOMACH:        GASTRIC WALL WITH NO SIGNIFICANT HISTOPATHOLOGIC ABNORMALITIES        Allergies:     Allergies   Allergen Reactions    Morphine Itching     Review of Systems:     As per HPI.     Physical Exam:   BP (!) 137/107 (BP Site: Left arm, Patient Position: Sitting, Cuff Size: Large)   Pulse 84   Temp 98.6 F (37 C) (Temporal)   Ht 5\' 3"    Wt 256 lb   BMI 45.35 kg/m     Vitals:    12/21/22 1400   BP: (!) 137/107   Pulse: 84   Temp:      Wt Readings from Last 10 Encounters:   12/21/22 256 lb   12/07/22 268 lb   12/05/22 272 lb   11/30/22 270 lb   11/16/22 259 lb   09/02/22 246 lb   08/16/22 241 lb   08/11/22 245 lb   07/06/22 242 lb   06/21/22 243 lb     Appearance: Comfortable, no acute distress, well nourished  HEENT: Normocephalic, clear conjunctiva.   Chest: Breathing unlabored. S1, S2, LS clear.  Neuro: A&Ox3  Psych: Calm, cooperative  Abd: Soft, non-tender, no palpable/visible abnormalities. Surgical incisions CDI with edges well approximated.     Assessment/Plan   Status post Laparoscopic Sleeve Gastrectomy  x 14 days. No postoperative complications. Tolerating diet without difficulties.     1. Advance diet per Registered Dietician. Fluid goal of 64 or more oz daily. Protein supplements goal of 50-60 grams daily.   2. Increase activity as tolerated.  No heavy lifting >15 lbs for a  total of 4-6 weeks after surgery.  3. Medications reviewed.   Take lansoprazole or other acid reducer for 3 months after surgery then stop and use an OTC acid reducer if needed.   Restart amlodipine for blood pressure daily.   4. Constipation prevention: Use Citrucel sugar-free or other fiber supplement daily; use Miralax as needed to soften stools.   5. Follow up with PCP regularly.    6. Routine care in six weeks or sooner if needed.      Return for 6 weeks with Registered Dietitian, 12 weeks with Nurse Practitioner.    Signed by: Elenor Legato, NP, FNP-BC    This note was generated by the [facility] EMR system/Dragon speech recognition and may contain inherent errors or omissions not intended by the user. Grammatical errors, random word insertions, deletions, pronoun errors and incomplete sentences are occasional consequences of this technology due to software limitations. Not all errors are caught or corrected. If there are questions or concerns about the content of this note or information contained within the body of this dictation  they should be addressed directly with the author for clarification.

## 2022-12-21 NOTE — Progress Notes (Signed)
S:  Pt presents for f/u after sleeve procedure.  Pt states tolerating clear liquid diet well.  Pt reports drinking 48-64 oz clear fluids daily and consuming about 60 grams protein from supplemental sources daily.  Pt reports has not yet started the recommended vitamin/mineral supplements as directed.  Pt is 14 days out from surgery and has lost 12 pounds so far.  Pt reports no issues w N/V/C/D at this time.    O:  Today's Wt:  256 lbs   Previous Wts:    Wt Readings from Last 10 Encounters:   12/07/22 268 lb   12/05/22 272 lb   11/30/22 270 lb   11/16/22 259 lb   09/02/22 246 lb   08/16/22 241 lb   08/11/22 245 lb   07/06/22 242 lb   06/21/22 243 lb   06/13/22 242 lb    BMI:  There is no height or weight on file to calculate BMI.    A:  Pt is getting adequate hydration and protein as evidenced by diet recall.  Pt verbalizes comprehension and desired compliance with next diet stage.    P:  1.  Advance diet to full liquids on 02/07 and then to soft diet on 02/14.  Diet guidelines reviewed and pt verbalized understanding and desired compliance.  2.  Pt to start all the required the vitamins/mineral supplements as directed.  3.  Pt to increase protein intake to the recommended 50-60 g from a supplemental source.  Other brands discussed  4.  Pt to increase clear liquids to the recommended 64 oz q day.  5.  F/u in 6 weeks for diet advance to solids.      Spent a total of 15 minutes educating pt in a individual one-on-one setting.

## 2022-12-23 ENCOUNTER — Encounter (HOSPITAL_BASED_OUTPATIENT_CLINIC_OR_DEPARTMENT_OTHER): Payer: Self-pay

## 2022-12-28 ENCOUNTER — Ambulatory Visit: Admission: RE | Admit: 2022-12-28 | Payer: BLUE CROSS/BLUE SHIELD | Source: Ambulatory Visit

## 2023-02-01 ENCOUNTER — Telehealth (HOSPITAL_BASED_OUTPATIENT_CLINIC_OR_DEPARTMENT_OTHER): Payer: BLUE CROSS/BLUE SHIELD | Admitting: Registered"

## 2023-03-02 ENCOUNTER — Other Ambulatory Visit (INDEPENDENT_AMBULATORY_CARE_PROVIDER_SITE_OTHER): Payer: Self-pay | Admitting: Internal Medicine

## 2023-03-02 DIAGNOSIS — I10 Essential (primary) hypertension: Secondary | ICD-10-CM

## 2023-03-13 ENCOUNTER — Ambulatory Visit (HOSPITAL_BASED_OUTPATIENT_CLINIC_OR_DEPARTMENT_OTHER): Payer: BLUE CROSS/BLUE SHIELD | Admitting: Family

## 2023-03-13 NOTE — Progress Notes (Deleted)
Progress Note    Patient Name: Victoria Woodward, Victoria Woodward  Age: 42 y.o.  Sex: female   DOB: 06-Nov-1981  MRN: [id number]    HPI:   Victoria Woodward returns for follow up 3 months post Laparoscopic Sleeve Gastrectomy .     Denies any nausea, vomiting, diarrhea, constipation, reflux, abdominal pain.   Tolerating a regular diet.   Vitamins - ***.  Portion sizes - *** oz.   Fluids - *** oz.  Protein supplements - *** grams.  Physical activity - *** days a week.  She has had *** lbs total weight loss.     Allergies:     Allergies   Allergen Reactions    Morphine Itching       Review of Systems:   As per the HPI.    Physical Exam:   There were no vitals taken for this visit.  BMI: There is no height or weight on file to calculate BMI.  Previous Weight:   Wt Readings from Last 15 Encounters:   12/21/22 256 lb   12/07/22 268 lb   12/05/22 272 lb   11/30/22 270 lb   11/16/22 259 lb   09/02/22 246 lb   08/16/22 241 lb   08/11/22 245 lb   07/06/22 242 lb   06/21/22 243 lb   06/13/22 242 lb   05/02/22 242 lb   04/13/22 236 lb 12.8 oz   12/09/20 204 lb   07/06/20 225 lb      Appearance: Comfortable, no acute distress, well nourished  HEENT: Normocephalic, clear conjunctiva   Chest: Breathing unlabored  Neuro: A&Ox3  Psych: Calm, cooperative    Assessment and Plan:   Victoria Woodward is 3 months post Laparoscopic Sleeve Gastrectomy . *** lbs total weight loss. Dietary compliance - ***.    Problems addressed:  ***    Labs *** reviewed.    ***     We will continue to monitor.     Portion control  Protein goal: 50-60 gm from supplement daily  Fluid goal: at least 64 oz daily  Activity as tolerated  F/u w/ PCP routinely  Problem list and obesity-related co-morbidities reviewed. Continue recommended bariatric surgery follow-up plan as noted to improve health outcomes and resolve obesity.  Routine care in *** months with labs or sooner if needed.      Patient verbalized understanding and agrees with the plan.    No follow-ups on  file.    Signed by: Lonna Cobb, NP, FNP-BC    This note was generated by the [facility] EMR system/Dragon speech recognition and may contain inherent errors or omissions not intended by the user. Grammatical errors, random word insertions, deletions, pronoun errors and incomplete sentences are occasional consequences of this technology due to software limitations. Not all errors are caught or corrected. If there are questions or concerns about the content of this note or information contained within the body of this dictation they should be addressed directly with the author for clarification.

## 2023-03-24 ENCOUNTER — Telehealth (INDEPENDENT_AMBULATORY_CARE_PROVIDER_SITE_OTHER): Payer: BLUE CROSS/BLUE SHIELD | Admitting: Licensed Professional Counselor

## 2023-03-24 DIAGNOSIS — Z9884 Bariatric surgery status: Secondary | ICD-10-CM

## 2023-03-24 DIAGNOSIS — F3341 Major depressive disorder, recurrent, in partial remission: Secondary | ICD-10-CM

## 2023-03-24 NOTE — Progress Notes (Signed)
Outpatient Services Bariatric Behavioral Health Follow Up Note    03/24/2023    Start:  1:00 PM End: 1:30  PM    Interpreter present? no    Telemedicine:   Verbal consent has been obtained from the patient to conduct a video visit.   Yes    Reason for appointment: Ailany Eskelson presents for a behavioral health follow up appointment in support of an optimal outcome for SG bariatric surgery date 12/07/22.     Current weight: 234 lbs (self report)  Lbs loss: TBD  Goal weight:TBD    Compliance with lifestyle/dietary changes and household adjustments:  Patient notes fair compliance with diet; she is challenged by taking vitamins and protein shakes.  She feels her weight loss is slow. She notes she is more active and getting more steps in. She recently moved and is adjusting to a new, unfamiliar location which has thrown her off track.  She skipped her recent appts with our RD and NP.    Appt applied motivational interviewing techniques to get her to the gym.  She returns to school next week and wants to rely of the gym to help with stress reduction.    Current Exercise Habits physical activity    Future behavioral goals: Wants to be more active in the gym    Barriers to compliance: Tired after work    Support and/or Support Groups: FB    Substance Use:  Tobacco use: no  Substance Use:     Psychiatric Symptoms:  Anxiety:  racing thoughts, in remission with medication  Eating Disorders: Denies associated symptoms.  History of disordered eating requiring treatment: Denies  Concerns about disordered eating behavior QMV:HQIONG  Depression: MDD, recurrent, dx this year, reports symptoms in partial remission and stable since therapy and psychiatric care  Mania: Denies associated symptoms.  OCD:  Denies associated symptoms  Psychosis:  Denies associated symptoms  Other: ADHD, dx this year     Current Psychiatrist: Dr. De Blanch, monitored 1x a month, [facility]; looking for a new psychiatrist  Psychotropic  medications: Sertraline 50 mg; Victoria Woodward, Gabapentin     Assessments and inventories:     Patient Health Questionnaire-9: PHQ-9 (Results):  No data recorded      Mental Status Exam:   General Appearance: neatly groomed, casually dressed, and overweight   Behavior/Psychomotor: normal  and good eye contact  Mood: euthymic  Affect: congruent with mood  Speech: normal pitch and normal volume  Language: verbal expression is clear , has comprehensible ideas through verbal articulation, and ideas are conveyed and properly produced  Thought Form/Process: well organized, associations intact, and goal directed  Thought Content: absent of psychotic symptoms and absent of suicidal or homicidal ideation  Perception /Sensorium: clear, intact and makes sense of stimuli received  Judgment: good  Insight: good  Cognitive Functioning: cognition grossly intact, alert, oriented to person, oriented to place, oriented to time, oriented to situation, memory intact, and attentive     Risk Assessment:   Risk factors: Hx of depression  Protective Factors: Identifies reasons for living Responsibility to family or others; living with family, Supportive social network of family or friends, and Engaged in work or school  Suicide Attempts/Gestures/Thoughts/Plan:-No suicidal thoughts, no intent, no plan  Homicidal Thoughts/Plan/Gestures:-No homicidal thoughts, no intent, no plan.  Recent Physical Aggression/Violence/Anger:Denies associated symptoms.  Access to Weapons: Yes, firearms in its case    Diagnosis & Problem:      No diagnosis found.  Patient Active Problem List  Diagnosis Date Noted    Morbid obesity 12/07/2022    Other sleep apnea 12/05/2022    Morbid obesity with BMI of 45.0-49.9, adult 11/30/2022    Vitamin D deficiency 11/30/2022    Thiamine deficiency 11/30/2022    Gastroesophageal reflux disease without esophagitis 05/09/2022    Morbid obesity with BMI of 40.0-44.9, adult     Anxiety 03/28/2022    Major depressive disorder  03/28/2022    Intractable back pain 05/07/2020    Headache 05/07/2020    Closed compression fracture of body of L1 vertebra 05/06/2020    Fall 04/15/2020    Adjustment reaction with anxiety and depression 08/25/2014    Class 2 obesity due to excess calories without serious comorbidity with body mass index (BMI) of 38.0 to 38.9 in adult 07/08/2013       Clinical Summary:     Victoria Woodward presents with cognitive factors and behavioral adjustments that support an optimal outcome for SG bariatric surgery date 12/07/22.  Patient asserts access to appropriate resources and commitment to post-operative bariatric program.    Victoria Woodward challenges to focus on are in the following areas:  Establishing treatment with a new psychiatrist and therapist  Re-scheduling bariatric f/u with our NP and RD  New gym membership   Establishing an exercise schedule    Clinical Recommendations and Plan:   Continue recommended bariatric surgery follow-up plan to improve health outcomes and resolve obesity.      The patient demonstrated capacity, understood and agreed with this plan of care and practice guidelines. An opportunity for questions was given. The patient is safe to leave from appointment.      Sabino Niemann, [facility]  Therapist IV  Integrated Behavioral Health

## 2024-07-08 ENCOUNTER — Ambulatory Visit (INDEPENDENT_AMBULATORY_CARE_PROVIDER_SITE_OTHER): Admitting: Family
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
## 2024-07-10 ENCOUNTER — Ambulatory Visit (HOSPITAL_BASED_OUTPATIENT_CLINIC_OR_DEPARTMENT_OTHER): Payer: BLUE CROSS/BLUE SHIELD | Admitting: Family

## 2024-11-26 ENCOUNTER — Encounter (HOSPITAL_BASED_OUTPATIENT_CLINIC_OR_DEPARTMENT_OTHER): Payer: Self-pay | Admitting: Internal Medicine

## 2024-11-27 ENCOUNTER — Ambulatory Visit (HOSPITAL_BASED_OUTPATIENT_CLINIC_OR_DEPARTMENT_OTHER): Admitting: Internal Medicine

## 2024-11-27 ENCOUNTER — Encounter (HOSPITAL_BASED_OUTPATIENT_CLINIC_OR_DEPARTMENT_OTHER): Payer: Self-pay | Admitting: Internal Medicine

## 2024-11-27 ENCOUNTER — Encounter (HOSPITAL_BASED_OUTPATIENT_CLINIC_OR_DEPARTMENT_OTHER): Payer: Self-pay

## 2024-11-27 VITALS — BP 137/88 | HR 78 | Temp 98.0°F | Ht 63.0 in | Wt 214.0 lb

## 2024-11-27 DIAGNOSIS — Z9884 Bariatric surgery status: Secondary | ICD-10-CM

## 2024-11-27 DIAGNOSIS — E785 Hyperlipidemia, unspecified: Secondary | ICD-10-CM

## 2024-11-27 DIAGNOSIS — E669 Obesity, unspecified: Secondary | ICD-10-CM

## 2024-11-27 DIAGNOSIS — R635 Abnormal weight gain: Secondary | ICD-10-CM

## 2024-11-27 NOTE — Progress Notes (Signed)
 The patient presents today for a consultation at [facility].     Victoria Woodward is a 44 y.o. year old female presenting for evaluation and is interested in non-surgical weight management.     PMH:   HLD  HTN - prior to [facility]  ADHD  Anxiety/depression   S/p sleeve gastrectomy 2024     Lost wt in adolescence with track.  Struggled with wt gain after 3rd pregnancy.   After partial hysterectomy pt started gaining wt.   After some loss in the family pt gained more wt.   Husband was killed in 2015 --> wt loss while grieving.   Started struggling with wt 2021 when father passed.   S/p SG 2024. Does not recall postop nadir (estimates 210 lb).     Highest adult weight: 298 lb  Current weight: 214 lb   Goal weight is 175 lb     The patient has tried the following in the past to lose weight:   Weight watchers  B12 injections   OTC wt loss drug     AOM history/contraindications:   Hx wellbutrin for mood - did not help     Specific eating patterns and habits:   B- grapefruit + boiled eggs or protein shake   Cucumber + onions + vinegar  L- protein + green veg +/- starch   D- protein + green veg +/- starch   Snacks/desserts- sunflower seeds, cashews  Beverages- water , rare soda     Current Execise:   40-50 min 4-5 times per week gym (treadmill)    The patient has the following comorbid conditions:  Problem List[1]  Past Surgical History[2]  Family History[3]  Current Medications[4]    Obesity ROS:  Contraindications to weight loss medications:   Nephrolithiasis: No  Seizures: No  Personal history of pancreatitis: No  Glaucoma/increase pressure in eyes: No    History of psychiatric treatment: Yes   History of drug abuse: No  History of or current alcohol use: 0  History of disordered eating requiring treatment: No  Concerns about disordered eating behavior now: No    Currently has pregnancy potential: NO- s/p hysterectomy  History of issues with PCOS symptoms (hirsuitism, acne, or menstrual irregularity): No  History of  treatment for PCOS: No    Diagnosis of OSA: Yes  If yes, compliant with CPAP: No, no longer using CPAP, intolerant of CPAP at the end  Symptoms of sleep apnea (daytime somnolence, snoring, witnessed apnea): light snoring    Family history of medullary thyroid  cancer/MEN syndrome: No  Family history of T2DM: father    Physical Exam:  Constitutional: NAD, Looks well, able to respond appropriately to questions  HEENT: NCAT, anicteric sclera  Resp: able to complete full sentences, respirations unlabored  Neuro: alert and oriented  Psych: appropriate judgement, mood, and thought content     Assessment/Plan:    Comorbidity risks:  No data recorded  NAFLD scores:  NAFLD Score: -0.78    FIB4 SCORE: 1.09       The 10-year ASCVD risk score (Arnett DK, et al., 2019) is: 0.6%    Values used to calculate the score:      Age: 12 years      Clinically relevant sex: Female      Is Non-Hispanic African American: Yes      Diabetic: No      Tobacco smoker: No      Systolic Blood Pressure: 137 mmHg      Is BP  treated: No      HDL Cholesterol: 99 mg/dL      Total Cholesterol: 278 mg/dL    Need to address comorbid conditions/risks?  Sleep: No  NAFLD: No  Lipids: No  CHF: No  Diabetes: No    1. Obesity (BMI 30-39.9)  Comprehensive Metabolic Panel    Hemoglobin A1C    Lipid Panel    TSH    Vitamin D , 25 OH, Total    Vitamin B12    Whole Blood Vitamin B1 (Thiamine )    Vitamin A     Iron    Folate      2. S/P bariatric surgery  Comprehensive Metabolic Panel    Hemoglobin A1C    Lipid Panel    TSH    Vitamin D , 25 OH, Total    Vitamin B12    Whole Blood Vitamin B1 (Thiamine )    Vitamin A     Iron    Folate      3. Hyperlipidemia, unspecified hyperlipidemia type        4. Multifactorial weight gain            PLAN TODAY:    Obesity -   Provided nutrition/exercise guidance. RD appt. Exercise appt with in body scan.  Trial zepbound (once labs results). Denies personal history of pancreatitis. Denies personal/family history of medullary thyroid   cancer or MEN syndrome. Denies strong family history of pancreatic cancer. Discussed MOA, SE profile.    S/p bariatric surgery -  Nutrition labs ordered    HLD -   Lipid panel ordered    Follow up:  GLP1 pathway      Leah Queen, MD 11/27/2024 2:29 PM         [1]   Patient Active Problem List  Diagnosis    Fall    Class 2 obesity due to excess calories without serious comorbidity with body mass index (BMI) of 38.0 to 38.9 in adult    Adjustment reaction with anxiety and depression    Closed compression fracture of body of L1 vertebra (CMS/HCC)    Intractable back pain    Headache    Anxiety    Major depressive disorder    Morbid obesity with BMI of 40.0-44.9, adult (CMS/HCC)    Gastroesophageal reflux disease without esophagitis    Morbid obesity with BMI of 45.0-49.9, adult (CMS/HCC)    Vitamin D  deficiency    Thiamine  deficiency    Other sleep apnea    Morbid obesity (CMS/HCC)   [2]   Past Surgical History:  Procedure Laterality Date    CESAREAN SECTION  2003    ENDOMETRIOSIS SURGERY  2006    had endometrioma removed from abdominal wall with mesh placement    ESOPHAGOGASTRODUODENOSCOPY (EGD), BIOPSY N/A 07/06/2022    Procedure: EGD, BIOPSY;  Surgeon: Rozanne Jody FERNS, MD;  Location: [facility];  Service: General;  Laterality: N/A;    ESOPHAGOGASTRODUODENOSCOPY (EGD), BIOPSY N/A 12/07/2022    Procedure: EGD, BIOPSY;  Surgeon: Rozanne Jody FERNS, MD;  Location: [facility];  Service: General;  Laterality: N/A;    HYSTERECTOMY  2006    partial hysterectomy    LAPAROSCOPIC, OMENTOPEXY N/A 12/07/2022    Procedure: LAPAROSCOPIC OMENTOPEXY W/ GASTRIC RESTRICTIVE PROCEDURE;  Surgeon: Rozanne Jody FERNS, MD;  Location:  MAIN OR;  Service: General;  Laterality: N/A;    LIFT, ARM (COSMETIC) Bilateral 2017    Breast lift lipo in arms and abdomen    ROBOT XI ASSISTED,LAPAROSCOPIC,SLEEVE GASTRECTOMY N/A  12/07/2022    Procedure: ROBOT XI ASSISTED, LAPAROSCOPIC, SLEEVE GASTRECTOMY EGD;  Surgeon: Rozanne Jody FERNS, MD;  Location:  [facility];  Service: General;  Laterality: N/A;    VERTEBROPLASTY/KYPHOPLASTY N/A 05/08/2020    Procedure: CLEMMIE;  Surgeon: Audrey Dallas Lenis, MD;  Location: [facility];  Service: Interventional Radiology;  Laterality: N/A;   [3]   Family History  Problem Relation Name Age of Onset    Dermatomyositis Mother  76    Heart failure Mother      Diabetes Father Victory     Heart disease Father Victory         CHF    Hyperlipidemia Father Victory     Dementia Father Victory     Diabetes Sister Sharlet     Heart disease Brother Fairy         VALVE disease    Colon cancer Maternal Grandmother Ronal         82    Cancer Maternal Grandmother Mary     Cancer Paternal Aunt          unkown   [4]   No current outpatient medications on file.     No current facility-administered medications for this visit.

## 2024-11-27 NOTE — Patient Instructions (Addendum)
 It was so nice to meet you! Please feel free to send me a message on Mychart if you have any questions or concerns.     If we ordered blood tests, please get your blood drawn for lab work. For most tests we ask that you fast (no food) for 8 hours before labs, but you can drink water and take medicines.     It is important for you to track your food intake. I would use an app like myfitnesspal, lose it, or It is important for you to track your food intake. I would use an app like myfitnesspal, lose it, or baritastic.   Get at least 80-100 grams of protein per day  Aim for less than 100 grams of carbohydrates per day. Primarily you should be getting carbohydrates from non-starchy vegetables, 1-2 servings of fruit daily, beans, legumes, and whole grains. Eventually we can gradually decrease your carbohydrates depending on how you respond.   The rest of your calories from healthy fats such as fish (tuna, salmon, herring), avocado, eggs, seeds/nuts, dairy (yogurt without sugar added, cheese, sour cream), olives/olive oil, etc. You should eat these foods to help with satiety, but you don't have to add them in if you already feel satisfied.     General advice for losing fat and not muscle-   When you say that you would like to "lose weight," you really mean that you would like to lose fat. We call this adipose tissue. However, when you lose weight you can also lose other types of tissue. Initially when you start changing your diet you lose "water weight." As you eat less calories than you burn, your body will use the carbohydrate that is stored in your muscle and liver for fuel. This is called glycogen, and glycogen is stored with water. So when the glycogen is used, the water is released.     We do not want you to lose muscle or bone when you are losing weight. There is only so much you can do to prevent this, so it is very important to do what you can. The following will help reduce muscle and bone loss.     1. Have  enough vitamin D to protect your bones. Many patients with excess weight have low vitamin D levels. Taking a supplement can help. Often we will check your level and recommend a supplement.    2. Eat enough protein. The types of "calories" that you eat matter. Protein in your diet is used to repair your muscle tissue, and to make the building blocks for a lot of other things in your body like hormones and enzymes. It is also an important signal that tells your body that you are getting enough overall nutrition.   - If you do not eat enough protein in your diet, your body will use the protein it already has to do these things. This means that you can start to break down the muscle tissue to get those building blocks.   - When you lose muscle, not only are you weaker, but you are losing the tissue that is the most "active" and able to burn calories, so you are more likely to regain weight when you increase your food intake again. You also are less able to use the glucose (sugar/starches/carbohydrates) that you eat, which can cause other health issues.   - Most men should eat AT LEAST 100 grams of protein per day, and most women should eat AT LEAST 80 grams  of protein per day. But this can change depending on kidney disease and other medical conditions, so please ask your doctor or dietician about your individual protein goal.     3. Exercise. Activating your muscles with exercise is important to signal to your body that it needs your muscle tissue and also tells your bones to stay strong. Research is very clear that exercise helps to prevent losing muscle when you are losing weight.   - both cardio and strength types of exercise help, but strength training may help more. We recommend that you try to do a combination of both types to get the most protection. This also helps your overall health, mobility, and energy levels!  - If you are not sure what you are able to do, please ask your health care team about what types  of exercise are safe for you to do.   __    Zepbound instructions  Savings card information: [email or web address]    Dose information  Start at 2.5 mg once a week for 4 weeks  Then you will take 5.0 mg once a week for 4 weeks    After this we will decide whether or not to continue to increase the dose based on how you are feeling. The dosing goes as below:  7.5 mg for 4 weeks  10 mg for 4 weeks  12.5 mg for 4 weeks  15 mg from then on.      If you increase the dose and have severe nausea, vomiting or other symptoms, you can decrease to the previous dose you tolerated until you are feeling better. As long as you feel that it is helping with your appetite, you can stay on the lower dose as long as you need to. You will just need to let us  know so we can change your prescription.      This medication will help you to feel less hungry over all and also help you feel full quickly when you start eating. The most common side effect is nausea, and is worse when you continue to eat after you get the "full" feeling. The other most common side effects are diarrhea, vomiting, constipation, dyspepsia, and abdominal pain.      If you have severe abdominal pain or other symptoms that concern you, please call us  or go to the Emergency Room. Other things to watch out for would be changes in vision, or changes in how much you are using the bathroom (urine). You can have damage to your kidneys if you get severely dehydrated, so you should stop the medicine if you are not able to drink enough.      You should rotate the site of injection. It should not matter what time of day you take the injection, but you should take it the same day of the week every week.     For instructions for how to give yourself the injection, please click the link below:   [email or web address]      To make sure that you don't lose muscle, you need to make sure you eat enough protein and you exercise as  much as you can. If you have questions about how to do this, please ask us  and we can give you specific instructions! It is harder to eat enough protein if you are not very hungry or can't eat much, so this is the most important habit that we need to develop. If you lose muscle, it will make it easier to regain  weight, and also make you weaker and effect other functions of your body.     Females and Males of Reproductive Potential: Use of Zepbound may reduce the efficacy of oral hormonal contraceptives due to delayed gastric emptying. This delay is largest after the first dose and diminishes over time. Advise patients using oral hormonal contraceptives to switch to a non-oral contraceptive method, or add a barrier method of contraception, for 4 weeks after initiation with Zepbound and for 4 weeks after each dose escalation.     __    We are starting you on a medication that we hope will be a helpful tool on your journey to lose weight and improve your health. These medications, called GLP1 receptor agonists, help to decrease your appetite and make it easier for you to eat in a healthier way. Our goal is to give you support along the way, and make sure that you have all the tools you need to be successful.     We recommend that you do the following things to get started:  Do our nutrition class. This is a 1 hour long virtual class with our dietician that will give you some information about how to eat better and make sure that you are losing weight in a healthy way. After the class, you will get an email with information such as a diet manual and some video modules as well. If you would like to see the dietician 1 on 1, that can be scheduled as well. We offer a "personalized program" which allows you to see the dietician 4 times for $200, which is usually less than the copays charged by insurance companies.   See our exercise physiologist and have an In-body test. This is a fancy scale that allows us  to see how much  muscle and fat you have. We can then watch this over time, and make sure that you are losing fat but not muscle. Our exercise staff will also help you to come up with a plan to start being more active, whatever that looks like for you right now.   Meet with our pharmacist. Between visits with the doctor/nurse practitioner on our team you will have quick check-ins with our pharmacist to help you adjust the dose of the medication. These medications are typically increased every month until you reach the full dose, but depending on how you feel with them, this can be modified. We want to make sure you have the right dose and the right prescription to keep you moving forward. This also helps us  to stay in touch and address any side effects you might have.     You will be scheduled for the following visits (virtual on mychart or in office):  Month 1: Pharmacist for dose change/prescription  Month 2: Pharmacist  Month 3: Pharmacist  Month 4: Doctor/Nurse Practitioner follow up    Recommended: these are not mandatory, but encouraged.   Month 1: nutrition class and exercise visit with Inbody test  Month 4: repeat Inbody test    We are always working together, so members of your team will be communicating and making sure you get the care you need!  Never hesitate to reach out if you have any questions or concerns.

## 2024-12-03 ENCOUNTER — Encounter (INDEPENDENT_AMBULATORY_CARE_PROVIDER_SITE_OTHER): Payer: Self-pay

## 2024-12-03 ENCOUNTER — Telehealth (INDEPENDENT_AMBULATORY_CARE_PROVIDER_SITE_OTHER)

## 2024-12-03 DIAGNOSIS — Z719 Counseling, unspecified: Secondary | ICD-10-CM | POA: Insufficient documentation

## 2024-12-03 DIAGNOSIS — Z713 Dietary counseling and surveillance: Secondary | ICD-10-CM | POA: Insufficient documentation

## 2024-12-03 DIAGNOSIS — E669 Obesity, unspecified: Secondary | ICD-10-CM | POA: Insufficient documentation

## 2024-12-03 NOTE — Progress Notes (Signed)
 This visit is being conducted via video and/or telephone.  Verbal consent has been obtained from the patient to conduct a video and telephone: yes    S:   Victoria Woodward is a pleasant 44 y.o. female with obesity presenting for initial nutrition visit for non-surgical weight loss.   Patient's stated reason for visit: referral from Dr. Albina.    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  Post op Nadir - 210 lbs  Pt has tried the following diet and/or exercise programs:  s/p sleeve gastrectomy 2024.    Lowest adult weight maintained: 205 lbs (around 215 lbs).    Highest adult weight: 298 lbs.  Goal weight: 175 lbs.     Lifestyle/Activity Level/Exercise:    Current Exercise: 3-4 times per week (cardio) at gym; 40-60 minutes  Barriers to physical activity/exercise:  none.  Sleep: good sleep  Food Tracking: MyFitnessPal; none currently. Weighing portions with packed lunch at work and other meals at home - 2 oz protein + 2-3 tbsp veggies + minimal starches     Psychosocial History:    Emotional eating: no  Grazing/Snacking often: no, good snacks and intentional (trail mix, seaweed, cashews)  Overeating/ large portions: small      Social History:   Lives with: son  Occupation: desk jobs.  Primary cook: self  Primary grocery shopping: self    Food insecurity concerns: no  Hunger Vital Sign   - Worried About Running Out of Food in the Last Year: Never true    - Ran Out of Food in the Last Year: Never true      Nutritionally pertinent labs: no recent labs, pt getting done soon    Component      Latest Ref Rng 11/08/2022   Vitamin D , 25-OH, Total      30 - 100 ng/mL 7 (L)         Vitamin/Mineral Deficiency Hx:  Vit D  Pt is currently taking the following OTC supplements:  Calcium , B12, Vit D, MVI.    Allergies:  Allergies[1]  Food allergies:  none    Smoking: Tobacco Use History[2]    Comorbidities:  Problem List[3]    Diet Overview:    Still having protein shakes; will do a reset with a liquid diet for a couple weeks (protein shakes + broth)    Food Recall:     Breakfast: grapefruit (whole) + Kombucha OR protein shake OR 2 boiled eggs OR yogurt (Oikos) + protein granola     Snack: trail mix (higher protein) OR seaweed     Lunch: 11am: protein + veggie (3/4 of meat + all of veggie)     Snack: sunflower seeds OR trail mix OR popcorn     Dinner: protein shake OR fruit (watermelon or grapefruit) OR protein + veggies     Snacks: nothing after dinner     Beverages: water : 40 oz - 2-3 times per day; No soda or juice; Kombucha a couple times per week;      Alcohol: none      Food prepared outside home: cutting back; 3-4 times per month      Other pertinent dietary comments: hx gastric sleeve    O:  Ht:    Ht Readings from Last 1  Encounters:   11/27/24 5' 3     Wt: 214 lbs recent visit/ 208.4 (reported)   BMI:  37.91  IBW:  115 lbs +/-10%   Excess Wt:  99 lbs    Wt Readings from Last 10 Encounters:   11/27/24 214 lb   12/21/22 256 lb   12/07/22 268 lb   12/05/22 272 lb   11/30/22 270 lb   11/16/22 259 lb   09/02/22 246 lb   08/16/22 241 lb   08/11/22 245 lb   07/06/22 242 lb       InBody Results: 11/27/24  Patient has pace maker or defibrillator :  no    Weight: 211.4 lbs  Skeletal Muscle Mass: 63.9 lbs  Body Fat Mass: 92.7 lbs  BMI: 37.5  Body Fat Percentage: 45.2%  Basal Metabolic Rate 1505 kcal  Suggested Body Fat Lose: 61.1 lbs    A:   Nutrition Diagnosis: obesity related to a history of excessive energy intake, limited physical activity, knowledge deficit as evidenced by report, dietary recall, and BMI.      Estimated needs: 1505-1956 kcal (Inbody 1-1.3)  1200-1400 kcal (300 to 600 kcal deficit)  80-105g protein (25-30% of kcal)  120-160g carbohydrate (40-45% of kcal)  40-47g fat (30% of kcal)     Topics discussed:   Reviewed importance of timing and  consistency of meals, eating smaller more frequent meals, not skipping meals - doing protein shake if not hungry and benefit of still doing 30/30 rule of liquids and food intake  Discussed importance of protein intake, especially with starting GLP-1 and surgery - discussed daily goals and how to break up between meals and snacks. Pt weighing meals - recommend increasing protein to 3 oz with meals and continuing high protein snacks between meals  Reviewed benefit of fiber, recommend continuing to focus on produce with meals; pairing all meals with protein + fruit/vegetable  Reviewed Inbody and BMR - discussed calorie deficit for weight loss and option to track calories/protein in Lose It app  Pt meeting daily water  goals - recommend continue and discussed importance of making priority when starting Zepbound     Handouts provided:   Protein and fiber handout  High protein snacks  1200 kcal sample meal plan  Quick healthy meals  Nutrition guidelines for GLP-1    P:    Follow-up as needed     Initial goals:    1. Aim to get in 80-100 grams protein per day - divide between meals and snacks - aim for 20-30 grams with meals and 10-15 grams with snacks  2. Include protein shake most mornings, if doing yogurt and granola, could do protein shake later in day as a snack or with dinner  3. Continue aiming for produce with most meals/snacks - aim for 25 grams fiber per day (see handout)  4. Continue water  intake and exercise routine!    Pt verbalized understanding and did not voice any additional questions.  Spent a total of 31 minutes educating pt in an individual one-on-one setting.  Plan reviewed with DO/NP.    Start time: 1032  Stop time: 1103         [1]   Allergies  Allergen Reactions    Morphine  Itching   [2]   Social History  Tobacco Use   Smoking Status Former    Types: Cigars    Quit date: 2022    Years since quitting: 4.0   Smokeless Tobacco Never   Tobacco Comments  1 black per day   [3]   Patient Active Problem  List  Diagnosis    Fall    Class 2 obesity due to excess calories without serious comorbidity with body mass index (BMI) of 38.0 to 38.9 in adult    Adjustment reaction with anxiety and depression    Closed compression fracture of body of L1 vertebra (CMS/HCC)    Intractable back pain    Headache    Anxiety    Major depressive disorder    Morbid obesity with BMI of 40.0-44.9, adult (CMS/HCC)    Gastroesophageal reflux disease without esophagitis    Morbid obesity with BMI of 45.0-49.9, adult (CMS/HCC)    Vitamin D  deficiency    Thiamine  deficiency    Other sleep apnea    Morbid obesity (CMS/HCC)

## 2024-12-04 ENCOUNTER — Telehealth (HOSPITAL_BASED_OUTPATIENT_CLINIC_OR_DEPARTMENT_OTHER): Payer: Self-pay

## 2024-12-04 NOTE — Progress Notes (Signed)
 Victoria Woodward is a 44 y.o. year old female presenting for an exercise and physical activity evaluation       Inbody Results:Discussed inbody results virtually     General Recommendation:  150 minutes a week of aerobic activity and two times per week of strength training that work all major muscle groups.       PLAN/GOALS:  Weekly Exercise Plan  Strength Training -- 2 Days/Week  Do on non-consecutive days (e.g., Monday & Thursday)  5 exercises per session  3 sets per exercise  10-20 reps per set  Rest: 45-90 seconds between sets  Weight: Challenging by reps 15-20, but maintain good form  Progression:  When you can complete all 3 sets at 18-20 reps with good form, slightly increase the weight next session.  Stop each set with ~1-3 reps left in the tank.    Cardio Training -- 4 Days/Week  20-45 minutes per session  Heart Rate Target: 100-140 bpm  Effort Level: You should be able to talk in full sentences but feel like youre working  [employer] (rotate as needed):  Brisk walking (treadmill or outdoors)  Stationary bike  [employer] or pool walking  Progression:  Start at 20-25 minutes per session if deconditioned  Increase by 5 minutes every 1-2 weeks  If HR >140 bpm ? slow down slightly  If HR <100 bpm ? gently increase pace or resistance    Example Weekly Schedule  Monday -- Strength (+ optional 10-15 min easy cardio)  Tuesday -- Cardio (20-45 min @ 100-140 bpm)  Wednesday -- Cardio (20-45 min @ 100-140 bpm)  Thursday -- Strength  Friday -- Cardio (20-45 min @ 100-140 bpm)  Saturday -- Cardio (20-45 min @ 100-140 bpm)  Sunday -- Rest or light activity    Coaching Notes  Any cardio counts as long as your heart rate stays in the target zone  Cardio can be split into two shorter sessions (e.g., 15 + 15 min)  Missed days arent failures--just resume the plan the next day  Focus on consistency, good form, and gradual progress

## 2024-12-06 ENCOUNTER — Encounter (HOSPITAL_BASED_OUTPATIENT_CLINIC_OR_DEPARTMENT_OTHER): Payer: Self-pay

## 2024-12-17 ENCOUNTER — Other Ambulatory Visit (INDEPENDENT_AMBULATORY_CARE_PROVIDER_SITE_OTHER)

## 2024-12-17 DIAGNOSIS — Z9884 Bariatric surgery status: Secondary | ICD-10-CM

## 2024-12-17 DIAGNOSIS — E669 Obesity, unspecified: Secondary | ICD-10-CM

## 2024-12-17 LAB — COMPREHENSIVE METABOLIC PANEL
ALT: 17 U/L (ref <=55)
AST (SGOT): 20 U/L (ref <=41)
Albumin/Globulin Ratio: 1.3 (ref 0.9–2.2)
Albumin: 4.1 g/dL (ref 3.5–4.9)
Alkaline Phosphatase: 81 U/L (ref 37–117)
Anion Gap: 9 (ref 5.0–15.0)
BUN: 11 mg/dL (ref 7–21)
Bilirubin, Total: 0.4 mg/dL (ref 0.2–1.2)
CO2: 25 meq/L (ref 17–29)
Calcium: 9.5 mg/dL (ref 8.5–10.5)
Chloride: 107 meq/L (ref 99–111)
Creatinine: 0.9 mg/dL (ref 0.4–1.0)
GFR: >60 mL/min/1.73 m2 (ref >=60.0)
Globulin: 3.1 g/dL (ref 2.0–3.6)
Glucose: 85 mg/dL (ref 70–100)
Hemolysis Index: 4 {index}
Potassium: 4.2 meq/L (ref 3.5–5.3)
Protein, Total: 7.2 g/dL (ref 6.0–8.3)
Sodium: 141 meq/L (ref 135–145)

## 2024-12-17 LAB — HEMOGLOBIN A1C
Average Estimated Glucose: 108.3 mg/dL
Hemoglobin A1C: 5.4 % (ref 4.6–5.6)

## 2024-12-17 LAB — LIPID PANEL
Cholesterol / HDL Ratio: 3.8 {index}
Cholesterol: 256 mg/dL — ABNORMAL HIGH (ref <=199)
HDL: 67 mg/dL (ref >=40)
LDL Calculated: 178 mg/dL — ABNORMAL HIGH (ref 0–99)
Non-HDL Cholesterol: 189 mg/dL — ABNORMAL HIGH (ref <130.0)
Triglycerides: 70 mg/dL (ref 34–149)
VLDL Calculated: 14 mg/dL (ref 10–40)

## 2024-12-17 LAB — TSH: TSH: 0.82 u[IU]/mL (ref 0.35–4.94)

## 2024-12-17 LAB — FOLATE
Folate: 8.9 ng/mL (ref >5.4)
Hemolysis Index: 1 {index}

## 2024-12-17 LAB — IRON
Hemolysis Index: 4 {index}
Iron: 56 ug/dL (ref 32–157)

## 2024-12-17 LAB — VITAMIN B12: Vitamin B-12: 486 pg/mL (ref 211–911)

## 2024-12-17 LAB — VITAMIN D, 25 OH, TOTAL: Vitamin D 25-OH, Total: 20 ng/mL — ABNORMAL LOW (ref 30–100)

## 2024-12-18 LAB — VITAMIN A: Vitamin A: 53 ug/dL (ref 38–98)

## 2025-03-28 ENCOUNTER — Telehealth (HOSPITAL_BASED_OUTPATIENT_CLINIC_OR_DEPARTMENT_OTHER): Admitting: Internal Medicine
